# Patient Record
Sex: Female | Born: 1975
Health system: Southern US, Community
[De-identification: ages and names within clinical notes are randomized; demographics above are authoritative.]

## PROBLEM LIST (undated history)

## (undated) DIAGNOSIS — R0689 Other abnormalities of breathing: Secondary | ICD-10-CM

## (undated) DIAGNOSIS — G4730 Sleep apnea, unspecified: Secondary | ICD-10-CM

## (undated) DIAGNOSIS — R5382 Chronic fatigue, unspecified: Secondary | ICD-10-CM

## (undated) DIAGNOSIS — F418 Other specified anxiety disorders: Secondary | ICD-10-CM

## (undated) DIAGNOSIS — E7800 Pure hypercholesterolemia, unspecified: Secondary | ICD-10-CM

## (undated) DIAGNOSIS — R569 Unspecified convulsions: Secondary | ICD-10-CM

## (undated) DIAGNOSIS — I509 Heart failure, unspecified: Secondary | ICD-10-CM

## (undated) DIAGNOSIS — Z9989 Dependence on other enabling machines and devices: Secondary | ICD-10-CM

## (undated) DIAGNOSIS — E1159 Type 2 diabetes mellitus with other circulatory complications: Secondary | ICD-10-CM

## (undated) DIAGNOSIS — F329 Major depressive disorder, single episode, unspecified: Secondary | ICD-10-CM

## (undated) DIAGNOSIS — R609 Edema, unspecified: Secondary | ICD-10-CM

## (undated) DIAGNOSIS — M1990 Unspecified osteoarthritis, unspecified site: Secondary | ICD-10-CM

## (undated) DIAGNOSIS — D500 Iron deficiency anemia secondary to blood loss (chronic): Secondary | ICD-10-CM

## (undated) DIAGNOSIS — D649 Anemia, unspecified: Secondary | ICD-10-CM

## (undated) DIAGNOSIS — I10 Essential (primary) hypertension: Secondary | ICD-10-CM

## (undated) DIAGNOSIS — I871 Compression of vein: Secondary | ICD-10-CM

## (undated) DIAGNOSIS — F32A Depression, unspecified: Secondary | ICD-10-CM

## (undated) DIAGNOSIS — R000 Tachycardia, unspecified: Secondary | ICD-10-CM

## (undated) DIAGNOSIS — J45909 Unspecified asthma, uncomplicated: Secondary | ICD-10-CM

## (undated) DIAGNOSIS — E876 Hypokalemia: Secondary | ICD-10-CM

## (undated) DIAGNOSIS — R5381 Other malaise: Secondary | ICD-10-CM

## (undated) DIAGNOSIS — G4733 Obstructive sleep apnea (adult) (pediatric): Secondary | ICD-10-CM

## (undated) DIAGNOSIS — N920 Excessive and frequent menstruation with regular cycle: Secondary | ICD-10-CM

## (undated) DIAGNOSIS — D219 Benign neoplasm of connective and other soft tissue, unspecified: Secondary | ICD-10-CM

## (undated) DIAGNOSIS — F445 Conversion disorder with seizures or convulsions: Secondary | ICD-10-CM

## (undated) DIAGNOSIS — E559 Vitamin D deficiency, unspecified: Secondary | ICD-10-CM

## (undated) DIAGNOSIS — E119 Type 2 diabetes mellitus without complications: Secondary | ICD-10-CM

## (undated) DIAGNOSIS — R011 Cardiac murmur, unspecified: Secondary | ICD-10-CM

## (undated) HISTORY — PX: PORTACATH PLACEMENT: SHX2246

## (undated) HISTORY — DX: Type 2 diabetes mellitus without complications: E11.9

## (undated) HISTORY — DX: Excessive and frequent menstruation with regular cycle: N92.0

## (undated) HISTORY — DX: Chronic fatigue, unspecified: R53.82

## (undated) HISTORY — DX: Benign neoplasm of connective and other soft tissue, unspecified: D21.9

## (undated) HISTORY — PX: LEG SURGERY: SHX1003

## (undated) HISTORY — DX: Obstructive sleep apnea (adult) (pediatric): G47.33

## (undated) HISTORY — DX: Other malaise: R53.81

## (undated) HISTORY — DX: Unspecified asthma, uncomplicated: J45.909

## (undated) HISTORY — DX: Depression, unspecified: F32.A

## (undated) HISTORY — DX: Iron deficiency anemia secondary to blood loss (chronic): D50.0

## (undated) HISTORY — DX: Other specified anxiety disorders: F41.8

## (undated) HISTORY — DX: Dependence on other enabling machines and devices: Z99.89

## (undated) HISTORY — DX: Edema, unspecified: R60.9

## (undated) HISTORY — DX: Pure hypercholesterolemia, unspecified: E78.00

## (undated) HISTORY — DX: Hypomagnesemia: E83.42

## (undated) HISTORY — DX: Anemia, unspecified: D64.9

## (undated) HISTORY — DX: Type 2 diabetes mellitus with other circulatory complications: E11.59

## (undated) HISTORY — DX: Vitamin D deficiency, unspecified: E55.9

## (undated) HISTORY — DX: Hypokalemia: E87.6

## (undated) HISTORY — DX: Conversion disorder with seizures or convulsions: F44.5

## (undated) HISTORY — DX: Sleep apnea, unspecified: G47.30

## (undated) HISTORY — DX: Major depressive disorder, single episode, unspecified: F32.9

## (undated) HISTORY — PX: PORT A CATH INJECTION (ARMC HX): HXRAD1731

---

## 2000-11-22 ENCOUNTER — Encounter: Payer: Self-pay | Admitting: Emergency Medicine

## 2000-11-22 ENCOUNTER — Emergency Department (HOSPITAL_COMMUNITY): Admission: EM | Admit: 2000-11-22 | Discharge: 2000-11-22 | Payer: Self-pay | Admitting: Emergency Medicine

## 2009-02-11 ENCOUNTER — Emergency Department (HOSPITAL_COMMUNITY): Admission: EM | Admit: 2009-02-11 | Discharge: 2009-02-11 | Payer: Self-pay | Admitting: Emergency Medicine

## 2010-04-20 ENCOUNTER — Emergency Department (HOSPITAL_COMMUNITY): Admission: EM | Admit: 2010-04-20 | Discharge: 2010-04-20 | Payer: Self-pay | Admitting: Emergency Medicine

## 2011-03-09 LAB — CBC
HCT: 26.4 % — ABNORMAL LOW (ref 36.0–46.0)
Hemoglobin: 7.9 g/dL — ABNORMAL LOW (ref 12.0–15.0)
MCHC: 29.8 g/dL — ABNORMAL LOW (ref 30.0–36.0)
MCV: 63.3 fL — ABNORMAL LOW (ref 78.0–100.0)
Platelets: 341 10*3/uL (ref 150–400)
RBC: 4.17 MIL/uL (ref 3.87–5.11)
RDW: 20.3 % — ABNORMAL HIGH (ref 11.5–15.5)
WBC: 8 10*3/uL (ref 4.0–10.5)

## 2011-03-09 LAB — DIFFERENTIAL
Basophils Absolute: 0 10*3/uL (ref 0.0–0.1)
Basophils Relative: 0 % (ref 0–1)
Eosinophils Absolute: 0.1 10*3/uL (ref 0.0–0.7)
Eosinophils Relative: 1 % (ref 0–5)
Lymphocytes Relative: 11 % — ABNORMAL LOW (ref 12–46)
Lymphs Abs: 0.9 10*3/uL (ref 0.7–4.0)
Monocytes Absolute: 0.4 10*3/uL (ref 0.1–1.0)
Monocytes Relative: 5 % (ref 3–12)
Neutro Abs: 6.6 10*3/uL (ref 1.7–7.7)
Neutrophils Relative %: 83 % — ABNORMAL HIGH (ref 43–77)

## 2011-03-09 LAB — URINALYSIS, ROUTINE W REFLEX MICROSCOPIC
Bilirubin Urine: NEGATIVE
Glucose, UA: NEGATIVE mg/dL
Ketones, ur: NEGATIVE mg/dL
Protein, ur: NEGATIVE mg/dL
Urobilinogen, UA: 1 mg/dL (ref 0.0–1.0)

## 2011-03-09 LAB — BASIC METABOLIC PANEL
BUN: 8 mg/dL (ref 6–23)
Chloride: 110 mEq/L (ref 96–112)
GFR calc Af Amer: >60 mL/min (ref >60)
GFR calc non Af Amer: >60 mL/min (ref >60)
Potassium: 4.3 mEq/L (ref 3.5–5.1)
Sodium: 139 mEq/L (ref 135–145)

## 2011-03-09 LAB — URINE MICROSCOPIC-ADD ON

## 2011-03-09 LAB — PREGNANCY, URINE: Preg Test, Ur: NEGATIVE

## 2011-03-09 LAB — HEMOCCULT GUIAC POC 1CARD (OFFICE): Fecal Occult Bld: POSITIVE

## 2011-04-06 LAB — URINALYSIS, ROUTINE W REFLEX MICROSCOPIC
Ketones, ur: NEGATIVE mg/dL
Nitrite: NEGATIVE
pH: 5.5 (ref 5.0–8.0)

## 2011-04-06 LAB — URINE MICROSCOPIC-ADD ON

## 2013-02-08 DIAGNOSIS — D500 Iron deficiency anemia secondary to blood loss (chronic): Secondary | ICD-10-CM | POA: Insufficient documentation

## 2013-02-08 HISTORY — DX: Iron deficiency anemia secondary to blood loss (chronic): D50.0

## 2014-06-25 DIAGNOSIS — D649 Anemia, unspecified: Secondary | ICD-10-CM | POA: Insufficient documentation

## 2014-09-10 DIAGNOSIS — I10 Essential (primary) hypertension: Secondary | ICD-10-CM | POA: Insufficient documentation

## 2014-09-10 HISTORY — DX: Essential (primary) hypertension: I10

## 2015-08-04 ENCOUNTER — Emergency Department (HOSPITAL_COMMUNITY)
Admission: EM | Admit: 2015-08-04 | Discharge: 2015-08-04 | Disposition: A | Discharge status: Home / Self Care | Payer: Medicaid Other | Attending: Emergency Medicine | Admitting: Emergency Medicine

## 2015-08-04 ENCOUNTER — Encounter (HOSPITAL_COMMUNITY): Payer: Self-pay | Admitting: *Deleted

## 2015-08-04 DIAGNOSIS — G40909 Epilepsy, unspecified, not intractable, without status epilepticus: Secondary | ICD-10-CM | POA: Insufficient documentation

## 2015-08-04 DIAGNOSIS — I10 Essential (primary) hypertension: Secondary | ICD-10-CM | POA: Insufficient documentation

## 2015-08-04 DIAGNOSIS — Z79899 Other long term (current) drug therapy: Secondary | ICD-10-CM | POA: Diagnosis not present

## 2015-08-04 DIAGNOSIS — R569 Unspecified convulsions: Secondary | ICD-10-CM | POA: Diagnosis present

## 2015-08-04 DIAGNOSIS — Z3202 Encounter for pregnancy test, result negative: Secondary | ICD-10-CM | POA: Diagnosis not present

## 2015-08-04 HISTORY — DX: Unspecified convulsions: R56.9

## 2015-08-04 HISTORY — DX: Tachycardia, unspecified: R00.0

## 2015-08-04 HISTORY — DX: Essential (primary) hypertension: I10

## 2015-08-04 LAB — CBC WITH DIFFERENTIAL/PLATELET
BASOS ABS: 0 10*3/uL (ref 0.0–0.1)
Basophils Relative: 0 % (ref 0–1)
EOS PCT: 0 % (ref 0–5)
Eosinophils Absolute: 0 10*3/uL (ref 0.0–0.7)
HEMATOCRIT: 29 % — AB (ref 36.0–46.0)
HEMOGLOBIN: 8.5 g/dL — AB (ref 12.0–15.0)
LYMPHS ABS: 0.9 10*3/uL (ref 0.7–4.0)
LYMPHS PCT: 11 % — AB (ref 12–46)
MCH: 19.6 pg — ABNORMAL LOW (ref 26.0–34.0)
MCHC: 29.3 g/dL — AB (ref 30.0–36.0)
MCV: 67 fL — ABNORMAL LOW (ref 78.0–100.0)
MONOS PCT: 6 % (ref 3–12)
Monocytes Absolute: 0.5 10*3/uL (ref 0.1–1.0)
NEUTROS ABS: 7.1 10*3/uL (ref 1.7–7.7)
Neutrophils Relative %: 83 % — ABNORMAL HIGH (ref 43–77)
Platelets: 436 10*3/uL — ABNORMAL HIGH (ref 150–400)
RBC: 4.33 MIL/uL (ref 3.87–5.11)
RDW: 17.2 % — ABNORMAL HIGH (ref 11.5–15.5)
WBC: 8.5 10*3/uL (ref 4.0–10.5)

## 2015-08-04 LAB — BASIC METABOLIC PANEL
Anion gap: 8 (ref 5–15)
BUN: 15 mg/dL (ref 6–20)
CHLORIDE: 103 mmol/L (ref 101–111)
CO2: 23 mmol/L (ref 22–32)
CREATININE: 0.86 mg/dL (ref 0.44–1.00)
Calcium: 9 mg/dL (ref 8.9–10.3)
GFR calc non Af Amer: >60 mL/min (ref >60)
Glucose, Bld: 126 mg/dL — ABNORMAL HIGH (ref 65–99)
POTASSIUM: 4.5 mmol/L (ref 3.5–5.1)
SODIUM: 134 mmol/L — AB (ref 135–145)

## 2015-08-04 LAB — I-STAT BETA HCG BLOOD, ED (MC, WL, AP ONLY)

## 2015-08-04 MED ORDER — SODIUM CHLORIDE 0.9 % IV SOLN
1000.0000 mg | Freq: Once | INTRAVENOUS | Status: AC
  Administered 2015-08-04: 1000 mg via INTRAVENOUS
  Filled 2015-08-04: qty 10

## 2015-08-04 MED ORDER — LEVETIRACETAM 500 MG PO TABS: 500.0000 mg | ORAL_TABLET | Freq: Two times a day (BID) | ORAL | Status: DC
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
## 2015-08-04 NOTE — ED Notes (Signed)
Bed: SH70 Expected date:  Expected time:  Means of arrival:  Comments: EMS- 39yo F, seizure-like activity

## 2015-08-04 NOTE — Discharge Instructions (Signed)

## 2015-08-04 NOTE — ED Notes (Signed)
Patient states she was having lunch with her mother at [place] when she had a seizure.  Patient states she has no aurora or forewarning of seizures.  Patient states she hit the back of her head against a wall when she seized.  Patient states she also threw her lunch up after the seizure.  Patient states she had some right facial and arm numbness after seizure and that has resolved.  Patient denies nausea currently, but endorses fatigue related to being postictal.  On exam, not hematoma is noted to posterior head.  Breath sounds clear/diminished (may be r/t to body habitus).  Patient's heart sounds WNL, S1/S2, no rub, murmur or gallop. PERRL 9mm.  Grips bilaterally weak, but equal.

## 2015-08-04 NOTE — ED Notes (Signed)
Per EMS - patient presents to ED following seizure witnessed by mother approximately 30 minutes PTA.  Patient stayed in a sitting position during seizure and had some trembling to right arm post seizure which mother states is normal.  Patient also c/o right arm paresthesia and one episode of vomiting after seizure, both of which are unusual postictal behaviors.  Patient's vitals on scene, 130/90, HR 100, 98% RA, RR 16, CBG 103.  Patient denies pain anywhere and states she just "feels really tired."  Patient takes [place] for seizure and denies missing doses.

## 2016-02-03 DIAGNOSIS — R5381 Other malaise: Secondary | ICD-10-CM

## 2016-02-03 DIAGNOSIS — R000 Tachycardia, unspecified: Secondary | ICD-10-CM | POA: Insufficient documentation

## 2016-02-03 DIAGNOSIS — N938 Other specified abnormal uterine and vaginal bleeding: Secondary | ICD-10-CM | POA: Insufficient documentation

## 2016-02-03 DIAGNOSIS — R5383 Other fatigue: Secondary | ICD-10-CM

## 2016-02-03 HISTORY — DX: Other specified abnormal uterine and vaginal bleeding: N93.8

## 2016-02-03 HISTORY — DX: Other malaise: R53.81

## 2016-03-31 DIAGNOSIS — G8929 Other chronic pain: Secondary | ICD-10-CM

## 2016-03-31 DIAGNOSIS — G40309 Generalized idiopathic epilepsy and epileptic syndromes, not intractable, without status epilepticus: Secondary | ICD-10-CM

## 2016-03-31 DIAGNOSIS — IMO0001 Reserved for inherently not codable concepts without codable children: Secondary | ICD-10-CM

## 2016-03-31 DIAGNOSIS — R51 Headache: Secondary | ICD-10-CM

## 2016-03-31 DIAGNOSIS — Z789 Other specified health status: Secondary | ICD-10-CM

## 2016-03-31 DIAGNOSIS — R519 Headache, unspecified: Secondary | ICD-10-CM | POA: Insufficient documentation

## 2016-03-31 HISTORY — DX: Other chronic pain: G89.29

## 2016-03-31 HISTORY — DX: Generalized idiopathic epilepsy and epileptic syndromes, not intractable, without status epilepticus: G40.309

## 2016-03-31 HISTORY — DX: Other specified health status: Z78.9

## 2016-03-31 HISTORY — DX: Reserved for inherently not codable concepts without codable children: IMO0001

## 2016-12-28 DIAGNOSIS — N939 Abnormal uterine and vaginal bleeding, unspecified: Secondary | ICD-10-CM | POA: Diagnosis not present

## 2016-12-28 DIAGNOSIS — D500 Iron deficiency anemia secondary to blood loss (chronic): Secondary | ICD-10-CM | POA: Diagnosis not present

## 2017-01-18 DIAGNOSIS — G40901 Epilepsy, unspecified, not intractable, with status epilepticus: Secondary | ICD-10-CM | POA: Insufficient documentation

## 2017-01-18 HISTORY — DX: Epilepsy, unspecified, not intractable, with status epilepticus: G40.901

## 2017-02-09 ENCOUNTER — Encounter: Payer: Self-pay | Admitting: Emergency Medicine

## 2017-02-09 ENCOUNTER — Emergency Department
Admission: EM | Admit: 2017-02-09 | Discharge: 2017-02-09 | Disposition: A | Discharge status: Home / Self Care | Payer: Medicaid Other | Attending: Emergency Medicine | Admitting: Emergency Medicine

## 2017-02-09 DIAGNOSIS — Z79899 Other long term (current) drug therapy: Secondary | ICD-10-CM | POA: Diagnosis not present

## 2017-02-09 DIAGNOSIS — G40909 Epilepsy, unspecified, not intractable, without status epilepticus: Secondary | ICD-10-CM | POA: Insufficient documentation

## 2017-02-09 DIAGNOSIS — I10 Essential (primary) hypertension: Secondary | ICD-10-CM | POA: Insufficient documentation

## 2017-02-09 DIAGNOSIS — R569 Unspecified convulsions: Secondary | ICD-10-CM

## 2017-02-09 LAB — BASIC METABOLIC PANEL
ANION GAP: 7 (ref 5–15)
BUN: 11 mg/dL (ref 6–20)
CALCIUM: 8.8 mg/dL — AB (ref 8.9–10.3)
CO2: 17 mmol/L — AB (ref 22–32)
Chloride: 113 mmol/L — ABNORMAL HIGH (ref 101–111)
Creatinine, Ser: 0.98 mg/dL (ref 0.44–1.00)
GFR calc Af Amer: >60 mL/min (ref >60)
GLUCOSE: 121 mg/dL — AB (ref 65–99)
Potassium: 4.1 mmol/L (ref 3.5–5.1)
Sodium: 137 mmol/L (ref 135–145)

## 2017-02-09 LAB — CBC
HEMATOCRIT: 35.6 % (ref 35.0–47.0)
HEMOGLOBIN: 11.6 g/dL — AB (ref 12.0–16.0)
MCH: 24.5 pg — AB (ref 26.0–34.0)
MCHC: 32.6 g/dL (ref 32.0–36.0)
MCV: 75.1 fL — ABNORMAL LOW (ref 80.0–100.0)
Platelets: 379 10*3/uL (ref 150–440)
RBC: 4.74 MIL/uL (ref 3.80–5.20)
RDW: 27.3 % — AB (ref 11.5–14.5)
WBC: 8.6 10*3/uL (ref 3.6–11.0)

## 2017-02-09 NOTE — ED Triage Notes (Signed)
Pt comes into the ED via POV c/o seizures that occurred earlier today.  Patient has h/o of seizure.  Seizure was unwitnessed while patient was sleeping.  Loss of bladder and broke her nails.  Patient presents postictal and has decreased speech from her normal according to mother.

## 2017-02-09 NOTE — Discharge Instructions (Signed)
As we discussed it is very important that you do not drive, go up on roofs, swim, or put yourself in any situation that might be dangerous for you or others if you were to have another seizure until you are cleared by a neurologist. Please seek medical attention for any high fevers, chest pain, shortness of breath, change in behavior, persistent vomiting, bloody stool or any other new or concerning symptoms. ° °

## 2017-02-09 NOTE — ED Notes (Signed)
Patient now awake, oriented, and normal speech at this time.
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
## 2017-02-11 ENCOUNTER — Emergency Department: Payer: Medicaid Other

## 2017-02-11 ENCOUNTER — Emergency Department
Admission: EM | Admit: 2017-02-11 | Discharge: 2017-02-11 | Disposition: A | Discharge status: Home / Self Care | Payer: Medicaid Other | Attending: Student in an Organized Health Care Education/Training Program | Admitting: Student in an Organized Health Care Education/Training Program

## 2017-02-11 ENCOUNTER — Telehealth: Payer: Self-pay | Admitting: Emergency Medicine

## 2017-02-11 DIAGNOSIS — Z79899 Other long term (current) drug therapy: Secondary | ICD-10-CM | POA: Insufficient documentation

## 2017-02-11 DIAGNOSIS — R609 Edema, unspecified: Secondary | ICD-10-CM

## 2017-02-11 DIAGNOSIS — G40909 Epilepsy, unspecified, not intractable, without status epilepticus: Secondary | ICD-10-CM | POA: Diagnosis not present

## 2017-02-11 DIAGNOSIS — E860 Dehydration: Secondary | ICD-10-CM | POA: Diagnosis not present

## 2017-02-11 DIAGNOSIS — M7989 Other specified soft tissue disorders: Secondary | ICD-10-CM | POA: Diagnosis present

## 2017-02-11 LAB — CBC WITH DIFFERENTIAL/PLATELET
BASOS ABS: 0 10*3/uL (ref 0–0.1)
BASOS PCT: 0 %
EOS ABS: 0.1 10*3/uL (ref 0–0.7)
EOS PCT: 1 %
HCT: 34 % — ABNORMAL LOW (ref 35.0–47.0)
Hemoglobin: 11 g/dL — ABNORMAL LOW (ref 12.0–16.0)
Lymphocytes Relative: 16 %
Lymphs Abs: 1.3 10*3/uL (ref 1.0–3.6)
MCH: 24.8 pg — ABNORMAL LOW (ref 26.0–34.0)
MCHC: 32.3 g/dL (ref 32.0–36.0)
MCV: 76.7 fL — AB (ref 80.0–100.0)
Monocytes Absolute: 0.4 10*3/uL (ref 0.2–0.9)
Monocytes Relative: 6 %
Neutro Abs: 6.3 10*3/uL (ref 1.4–6.5)
Neutrophils Relative %: 77 %
PLATELETS: 401 10*3/uL (ref 150–440)
RBC: 4.43 MIL/uL (ref 3.80–5.20)
RDW: 25.8 % — AB (ref 11.5–14.5)
WBC: 8.1 10*3/uL (ref 3.6–11.0)

## 2017-02-11 LAB — LEVETIRACETAM LEVEL: Levetiracetam Lvl: 7.1 ug/mL — ABNORMAL LOW (ref 10.0–40.0)

## 2017-02-11 LAB — HEPATIC FUNCTION PANEL
ALT: 14 U/L (ref 14–54)
AST: 21 U/L (ref 15–41)
Albumin: 3.5 g/dL (ref 3.5–5.0)
Alkaline Phosphatase: 56 U/L (ref 38–126)
TOTAL PROTEIN: 6.8 g/dL (ref 6.5–8.1)
Total Bilirubin: 0.4 mg/dL (ref 0.3–1.2)

## 2017-02-11 LAB — BASIC METABOLIC PANEL
ANION GAP: 6 (ref 5–15)
BUN: 12 mg/dL (ref 6–20)
CALCIUM: 8.6 mg/dL — AB (ref 8.9–10.3)
CO2: 17 mmol/L — ABNORMAL LOW (ref 22–32)
Chloride: 115 mmol/L — ABNORMAL HIGH (ref 101–111)
Creatinine, Ser: 0.95 mg/dL (ref 0.44–1.00)
Glucose, Bld: 131 mg/dL — ABNORMAL HIGH (ref 65–99)
Potassium: 3.7 mmol/L (ref 3.5–5.1)
SODIUM: 138 mmol/L (ref 135–145)

## 2017-02-11 LAB — LACTIC ACID, PLASMA: LACTIC ACID, VENOUS: 1.9 mmol/L (ref 0.5–1.9)

## 2017-02-11 LAB — HCG, QUANTITATIVE, PREGNANCY

## 2017-02-11 MED ORDER — PROCHLORPERAZINE EDISYLATE 5 MG/ML IJ SOLN
10.0000 mg | Freq: Once | INTRAMUSCULAR | Status: AC
  Administered 2017-02-11: 10 mg via INTRAVENOUS
  Filled 2017-02-11: qty 2

## 2017-02-11 MED ORDER — SODIUM CHLORIDE 0.9 % IV SOLN
1000.0000 mg | Freq: Once | INTRAVENOUS | Status: AC
  Administered 2017-02-11: 1000 mg via INTRAVENOUS
  Filled 2017-02-11: qty 10

## 2017-02-11 MED ORDER — DIPHENHYDRAMINE HCL 50 MG/ML IJ SOLN
25.0000 mg | Freq: Once | INTRAMUSCULAR | Status: AC
  Administered 2017-02-11: 25 mg via INTRAVENOUS
  Filled 2017-02-11: qty 1

## 2017-02-11 MED ORDER — PROMETHAZINE HCL 12.5 MG PO TABS: 12.5000 mg | ORAL_TABLET | Freq: Four times a day (QID) | ORAL | 0 refills | Status: DC | PRN

## 2017-02-11 MED ORDER — SODIUM CHLORIDE 0.9 % IV BOLUS (SEPSIS)
1000.0000 mL | Freq: Once | INTRAVENOUS | Status: AC
  Administered 2017-02-11: 1000 mL via INTRAVENOUS

## 2017-02-11 NOTE — ED Triage Notes (Signed)
Pt arrives to ER via POV c/o low Keppra levels. Pt states that she was here on Wednesday and was discharged. Pt family reports that ER called patient back today. Pt family reports pt is swollen all over. Pt last seizure was Wednesday.
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
## 2017-02-11 NOTE — ED Notes (Signed)
Pt given ginger ale for PO challenge per Dr. Quentin Cornwall request.  Pt sleepy upon this nurse entering room, pt encouraged to drink, parents asked to continue encouraging pt to drink

## 2017-02-11 NOTE — ED Notes (Signed)
When speaking with patient about beginning protocols, pt will not answer or open eyes, head down. Pt supporting her upper body in wheelchair. PT taken to room. Assisted to bed by 3 staff members. Pt able to follow commands.

## 2017-02-11 NOTE — Telephone Encounter (Signed)
Called mom and gave her levitricam level to relay to the patient's neurologist.  She says patient continues to have swelling all over and feels bad.  I told her to also call pcp to inform of the swelling.  She willcall both doctors right now.

## 2017-02-11 NOTE — ED Notes (Signed)
Pt's mother reports pt drank all of ginger ale.  Pt reports HA improved, denies nausea

## 2017-04-06 DIAGNOSIS — N920 Excessive and frequent menstruation with regular cycle: Secondary | ICD-10-CM | POA: Diagnosis not present

## 2017-04-06 DIAGNOSIS — D500 Iron deficiency anemia secondary to blood loss (chronic): Secondary | ICD-10-CM | POA: Diagnosis not present

## 2017-07-07 DIAGNOSIS — Z862 Personal history of diseases of the blood and blood-forming organs and certain disorders involving the immune mechanism: Secondary | ICD-10-CM | POA: Diagnosis not present

## 2017-07-07 DIAGNOSIS — N920 Excessive and frequent menstruation with regular cycle: Secondary | ICD-10-CM | POA: Diagnosis not present

## 2017-07-21 DIAGNOSIS — G40822 Epileptic spasms, not intractable, without status epilepticus: Secondary | ICD-10-CM

## 2017-07-21 DIAGNOSIS — H5015 Alternating exotropia: Secondary | ICD-10-CM

## 2017-07-21 HISTORY — DX: Alternating exotropia: H50.15

## 2017-07-21 HISTORY — DX: Epileptic spasms, not intractable, without status epilepticus: G40.822

## 2017-08-18 ENCOUNTER — Ambulatory Visit (INDEPENDENT_AMBULATORY_CARE_PROVIDER_SITE_OTHER): Payer: Medicaid Other | Admitting: Diagnostic Neuroimaging

## 2017-08-18 DIAGNOSIS — R569 Unspecified convulsions: Secondary | ICD-10-CM | POA: Diagnosis not present

## 2017-08-23 NOTE — Procedures (Signed)
   [facility]  EEG (ELECTROENCEPHALOGRAM) REPORT   STUDY DATE: 08/18/17 PATIENT NAME: Jane Edwards DOB: Apr 08, 1976 MRN: [id number]  ORDERING CLINICIAN: Heide Scales, NP  TECHNOLOGIST: Laretta Alstrom  TECHNIQUE: Electroencephalogram was recorded utilizing standard 10-20 system of lead placement and reformatted into average and bipolar montages.  RECORDING TIME: 28 minutes  ACTIVATION: hyperventilation and photic stimulation  CLINICAL INFORMATION: 41 year old female with seizure like activity.  FINDINGS: Posterior dominant background rhythms, which attenuate with eye opening, ranging 10-11 hertz and 15-20 microvolts. During evaluation patient began to have typical right hand shaking movements. She was not responsive to verbal command. Muscle artifact noted during event. Immediately following the event EEG brain rhythms were normal. No epileptiform correlates noted. Following event patient was crying.  No focal, lateralizing, epileptiform activity or seizures are seen. Patient recorded in the awake and drowsy state. EKG channel shows regular rhythm of 85-90 beats per minute.   IMPRESSION:  Normal EEG in the wake and drowsy states. Patient had typical right hand shaking movements during this recording without epileptiform correlates.    INTERPRETING PHYSICIAN:  Penni Bombard, MD Certified in Neurology, Neurophysiology and Neuroimaging  [facility] [address], [place], [place] [address] [phone]

## 2017-08-29 ENCOUNTER — Ambulatory Visit (INDEPENDENT_AMBULATORY_CARE_PROVIDER_SITE_OTHER): Payer: Medicaid Other | Admitting: Neurology

## 2017-08-29 ENCOUNTER — Encounter (INDEPENDENT_AMBULATORY_CARE_PROVIDER_SITE_OTHER): Payer: Self-pay

## 2017-08-29 ENCOUNTER — Encounter: Payer: Self-pay | Admitting: Neurology

## 2017-08-29 VITALS — BP 132/81 | HR 82 | Ht 65.0 in

## 2017-08-29 DIAGNOSIS — G40909 Epilepsy, unspecified, not intractable, without status epilepticus: Secondary | ICD-10-CM

## 2017-08-29 DIAGNOSIS — Z9989 Dependence on other enabling machines and devices: Secondary | ICD-10-CM

## 2017-08-29 DIAGNOSIS — Z5181 Encounter for therapeutic drug level monitoring: Secondary | ICD-10-CM

## 2017-08-29 DIAGNOSIS — G4733 Obstructive sleep apnea (adult) (pediatric): Secondary | ICD-10-CM

## 2017-08-29 HISTORY — DX: Epilepsy, unspecified, not intractable, without status epilepticus: G40.909

## 2017-08-29 HISTORY — DX: Obstructive sleep apnea (adult) (pediatric): G47.33

## 2017-08-29 MED ORDER — LEVETIRACETAM 1000 MG PO TABS: ORAL_TABLET | ORAL | Status: DC
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
## 2017-08-31 ENCOUNTER — Telehealth: Payer: Self-pay | Admitting: Neurology

## 2017-08-31 LAB — TOPIRAMATE LEVEL: Topiramate Lvl: 3.8 ug/mL (ref 2.0–25.0)

## 2017-08-31 LAB — LEVETIRACETAM LEVEL: LEVETIRACETAM: NOT DETECTED ug/mL (ref 10.0–40.0)

## 2017-08-31 MED ORDER — TOPIRAMATE ER 150 MG PO SPRINKLE CAP24: 300.0000 mg | EXTENDED_RELEASE_CAPSULE | Freq: Every day | ORAL | 5 refills | Status: DC

## 2017-08-31 MED ORDER — TOPIRAMATE 50 MG PO TABS: ORAL_TABLET | ORAL | 0 refills | Status: DC

## 2017-08-31 NOTE — Telephone Encounter (Signed)
I called patient. The Topamax level is in the low therapeutic range, the Keppra level is 0.  We will taper the Keppra going to 2000 mg daily for 2 weeks, 1000 mg daily for 2 weeks, then stop.  I will increase the Topamax using the 50 mg tablets going up by 50 mg each week until taking 150 mg twice daily.  The mother claims that the patient was taking Keppra, it is obviously either not being swallowed, or not being absorbed. There is no indication to continue the [place].

## 2017-09-11 ENCOUNTER — Encounter: Payer: Self-pay | Admitting: Neurology

## 2017-10-25 ENCOUNTER — Telehealth: Payer: Self-pay | Admitting: Neurology

## 2017-10-25 ENCOUNTER — Encounter: Payer: Self-pay | Admitting: Neurology

## 2017-10-25 ENCOUNTER — Ambulatory Visit: Payer: Medicaid Other | Admitting: Neurology

## 2017-10-25 VITALS — BP 145/97 | HR 94 | Ht 61.0 in | Wt 340.0 lb

## 2017-10-25 DIAGNOSIS — G4733 Obstructive sleep apnea (adult) (pediatric): Secondary | ICD-10-CM | POA: Diagnosis not present

## 2017-10-25 DIAGNOSIS — E669 Obesity, unspecified: Secondary | ICD-10-CM

## 2017-10-25 DIAGNOSIS — Z9989 Dependence on other enabling machines and devices: Secondary | ICD-10-CM

## 2017-10-25 DIAGNOSIS — R4189 Other symptoms and signs involving cognitive functions and awareness: Secondary | ICD-10-CM

## 2017-10-25 HISTORY — DX: Obesity, unspecified: E66.9

## 2017-10-25 HISTORY — DX: Other symptoms and signs involving cognitive functions and awareness: R41.89

## 2017-10-25 NOTE — Patient Instructions (Addendum)
We need to see your full set up, CPAP with mask, tubing and cables.- Next visit with NP to establish compliance.     CPAP and BiPAP Information CPAP and BiPAP are methods of helping a person breathe with the use of air pressure. CPAP stands for "continuous positive airway pressure." BiPAP stands for "bi-level positive airway pressure." In both methods, air is blown through your nose or mouth and into your air passages to help you breathe well. CPAP and BiPAP use different amounts of pressure to blow air. With CPAP, the amount of pressure stays the same while you breathe in and out. With BiPAP, the amount of pressure is increased when you breathe in (inhale) so that you can take larger breaths. Your health care provider will recommend whether CPAP or BiPAP would be more helpful for you. Why are CPAP and BiPAP treatments used? CPAP or BiPAP can be helpful if you have:  Sleep apnea.  Chronic obstructive pulmonary disease (COPD).  Heart failure.  Medical conditions that weaken the muscles of the chest including muscular dystrophy, or neurological diseases such as amyotrophic lateral sclerosis (ALS).  Other problems that cause breathing to be weak, abnormal, or difficult.  CPAP is most commonly used for obstructive sleep apnea (OSA) to keep the airways from collapsing when the muscles relax during sleep. How is CPAP or BiPAP administered? Both CPAP and BiPAP are provided by a small machine with a flexible plastic tube that attaches to a plastic mask. You wear the mask. Air is blown through the mask into your nose or mouth. The amount of pressure that is used to blow the air can be adjusted on the machine. Your health care provider will determine the pressure setting that should be used based on your individual needs. When should CPAP or BiPAP be used? In most cases, the mask only needs to be worn during sleep. Generally, the mask needs to be worn throughout the night and during any daytime  naps. People with certain medical conditions may also need to wear the mask at other times when they are awake. Follow instructions from your health care provider about when to use the machine. What are some tips for using the mask?  Because the mask needs to be snug, some people feel trapped or closed-in (claustrophobic) when first using the mask. If you feel this way, you may need to get used to the mask. One way to do this is by holding the mask loosely over your nose or mouth and then gradually applying the mask more snugly. You can also gradually increase the amount of time that you use the mask.  Masks are available in various types and sizes. Some fit over your mouth and nose while others fit over just your nose. If your mask does not fit well, talk with your health care provider about getting a different one.  If you are using a mask that fits over your nose and you tend to breathe through your mouth, a chin strap may be applied to help keep your mouth closed.  The CPAP and BiPAP machines have alarms that may sound if the mask comes off or develops a leak.  If you have trouble with the mask, it is very important that you talk with your health care provider about finding a way to make the mask easier to tolerate. Do not stop using the mask. Stopping the use of the mask could have a negative impact on your health. What are some tips for  using the machine?  Place your CPAP or BiPAP machine on a secure table or stand near an electrical outlet.  Know where the on/off switch is located on the machine.  Follow instructions from your health care provider about how to set the pressure on your machine and when you should use it.  Do not eat or drink while the CPAP or BiPAP machine is on. Food or fluids could get pushed into your lungs by the pressure of the CPAP or BiPAP.  Do not smoke. Tobacco smoke residue can damage the machine.  For home use, CPAP and BiPAP machines can be rented or  purchased through home health care companies. Many different brands of machines are available. Renting a machine before purchasing may help you find out which particular machine works well for you.  Keep the CPAP or BiPAP machine and attachments clean. Ask your health care provider for specific instructions. Get help right away if:  You have redness or open areas around your nose or mouth where the mask fits.  You have trouble using the CPAP or BiPAP machine.  You cannot tolerate wearing the CPAP or BiPAP mask.  You have pain, discomfort, and bloating in your abdomen. Summary  CPAP and BiPAP are methods of helping a person breathe with the use of air pressure.  Both CPAP and BiPAP are provided by a small machine with a flexible plastic tube that attaches to a plastic mask.  If you have trouble with the mask, it is very important that you talk with your health care provider about finding a way to make the mask easier to tolerate. This information is not intended to replace advice given to you by your health care provider. Make sure you discuss any questions you have with your health care provider. Document Released: 09/03/2004 Document Revised: 10/25/2016 Document Reviewed: 10/25/2016 Elsevier Interactive Patient Education  2017 [employer].

## 2017-10-25 NOTE — Telephone Encounter (Signed)
I received some notes from Dr. Tonia Ghent, and in April 2017, the patient had an EEG showing occasional bilateral spike and wave discharges.  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  Medication adjustments have not improved seizure frequency again suggesting many of these events are pseudoseizures.
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
## 2017-10-27 ENCOUNTER — Encounter: Payer: Self-pay | Admitting: Neurology

## 2017-10-28 ENCOUNTER — Encounter: Payer: Self-pay | Admitting: Neurology

## 2017-11-03 ENCOUNTER — Other Ambulatory Visit: Payer: Self-pay | Admitting: Neurology

## 2017-11-03 DIAGNOSIS — G4733 Obstructive sleep apnea (adult) (pediatric): Secondary | ICD-10-CM

## 2017-11-07 DIAGNOSIS — N920 Excessive and frequent menstruation with regular cycle: Secondary | ICD-10-CM | POA: Diagnosis not present

## 2017-11-07 DIAGNOSIS — D500 Iron deficiency anemia secondary to blood loss (chronic): Secondary | ICD-10-CM | POA: Diagnosis not present

## 2017-11-21 ENCOUNTER — Ambulatory Visit: Payer: Medicaid Other | Admitting: Neurology

## 2017-11-21 DIAGNOSIS — G4733 Obstructive sleep apnea (adult) (pediatric): Secondary | ICD-10-CM | POA: Diagnosis not present

## 2017-11-22 DIAGNOSIS — F4310 Post-traumatic stress disorder, unspecified: Secondary | ICD-10-CM

## 2017-11-22 DIAGNOSIS — Z6841 Body Mass Index (BMI) 40.0 and over, adult: Secondary | ICD-10-CM | POA: Insufficient documentation

## 2017-11-22 HISTORY — DX: Post-traumatic stress disorder, unspecified: F43.10

## 2017-11-22 HISTORY — DX: Body Mass Index (BMI) 40.0 and over, adult: Z684

## 2017-11-23 ENCOUNTER — Other Ambulatory Visit: Payer: Self-pay | Admitting: Neurology

## 2017-11-23 DIAGNOSIS — E669 Obesity, unspecified: Secondary | ICD-10-CM

## 2017-11-23 DIAGNOSIS — R4189 Other symptoms and signs involving cognitive functions and awareness: Secondary | ICD-10-CM

## 2017-11-23 DIAGNOSIS — G4719 Other hypersomnia: Secondary | ICD-10-CM

## 2017-11-23 NOTE — Procedures (Signed)
[facility] [address] [place], [place] [address] NAME: [name]                                                         DOB: [birth year] MEDICAL RECORD No:  [id number]                                DOS: 11/21/2017 REFERRING PHYSICIAN: Lenor Coffin, M.D.  STUDY PERFORMED: HST - apnea link   HISTORY: Jane Edwards is a 41 y.o. female, seen here referred from Dr. Jannifer Franklin to transfer her CPAP care from Dr. Tonia Ghent.  She was diagnosed with OSA and placed on CPAP- provided no additional information. She did not provide a copy of her sleep study and did not bring her CPAP machine as requested. Based on her and her mother's recollection she was told her apnea was not mild and associated with tachycardia, hypoxemia - and that she was needing CPAP. She resides 90 minutes by car from here.  She confessed that she became non -compliant with CPAP use after she ran out of supplies-    Addendum from 11-08- 2018, able to locate the studies from Dr. Marilu Favre of [facility]. The patient underwent a polysomnography on 01 June 2016 and was diagnosed with mild obstructive sleep apnea at an AHI of only 10.3, moderate snoring, no significant limb jerks, Symptoms : insomnia complaint, high degree of excessive daytime sleepiness was endorsed by Epworth Sleepiness Scale of 20. He recommended CPAP or BiPAP- I did not see that the patient return for CPAP titration study and I presume that she was placed on an auto CPAP.  The auto CPAP residual AHI is 0.2.  Epworth score today: 24/ 24, Fatigue severity score 58 points, BMI 64.24!    STUDY RESULTS:  Total Recording Time:  11 hours 23 minutes Total Apnea/Hypopnea Index (AHI):  0.6/Hour Average Oxygen Saturation: 96 %; Lowest Oxygen Saturation:  92%; no Oxygen Saturation Below 89% Average Heart Rate:  84 bpm (70-107)  IMPRESSION: This HST shows no need for the patient to use CPAP. Apnea is negligible, there was no hypoxemia.   I am surprised that these results are so benign given that this patient is at high risk for obesity hypoventilation. Her hypersomnia may be related to psychiatric underlying disorder/ depression. RECOMMENDATION: I will place a HLA narcolepsy test in Epic, no follow up in sleep clinic needed if negative. Dr. Jannifer Franklin will be informed of the results.  I certify that I have reviewed the raw data recording prior to the issuance of this report in accordance with the standards of the American Academy of Sleep Medicine (AASM). Larey Seat, M.D.      11-23-2017   Medical Director of [facility] at [facility], [place], [place] and [facility] and accredited by AASM

## 2017-11-24 ENCOUNTER — Telehealth: Payer: Self-pay

## 2017-11-24 NOTE — Telephone Encounter (Signed)
Called patient to go over HST results and schedule a repeat. No answer and no voicemail. Will try again later

## 2017-12-29 ENCOUNTER — Ambulatory Visit: Payer: Medicaid Other | Admitting: Neurology

## 2018-01-20 DIAGNOSIS — D500 Iron deficiency anemia secondary to blood loss (chronic): Secondary | ICD-10-CM | POA: Diagnosis not present

## 2018-01-20 DIAGNOSIS — N920 Excessive and frequent menstruation with regular cycle: Secondary | ICD-10-CM | POA: Diagnosis not present

## 2018-02-06 ENCOUNTER — Telehealth: Payer: Self-pay | Admitting: Neurology

## 2018-02-06 NOTE — Telephone Encounter (Signed)
[facility] is reaching out to let us know that since the patient never completed Sleep study she would need to repeat the process again.  "Hey we were unable to obtain realty and patient was going to have a release Ss. We were waiting on the date of the repeat SS so we could follow. Patient is aware of what needs to happen just need to restart with Dr Brett Fairy."  Patient will contact us when she is ready to set this up

## 2018-02-07 ENCOUNTER — Encounter: Payer: Self-pay | Admitting: Neurology

## 2018-02-07 ENCOUNTER — Ambulatory Visit: Payer: Medicaid Other | Admitting: Neurology

## 2018-02-07 VITALS — BP 135/88 | HR 91 | Ht 61.0 in | Wt 327.0 lb

## 2018-02-07 DIAGNOSIS — R569 Unspecified convulsions: Secondary | ICD-10-CM | POA: Diagnosis not present

## 2018-02-07 DIAGNOSIS — G4719 Other hypersomnia: Secondary | ICD-10-CM

## 2018-02-07 DIAGNOSIS — Z9989 Dependence on other enabling machines and devices: Secondary | ICD-10-CM | POA: Diagnosis not present

## 2018-02-07 DIAGNOSIS — F5109 Other insomnia not due to a substance or known physiological condition: Secondary | ICD-10-CM | POA: Diagnosis not present

## 2018-02-07 DIAGNOSIS — Z6841 Body Mass Index (BMI) 40.0 and over, adult: Secondary | ICD-10-CM

## 2018-02-07 DIAGNOSIS — E662 Morbid (severe) obesity with alveolar hypoventilation: Secondary | ICD-10-CM

## 2018-02-07 DIAGNOSIS — G4733 Obstructive sleep apnea (adult) (pediatric): Secondary | ICD-10-CM | POA: Diagnosis not present

## 2018-02-07 DIAGNOSIS — F519 Sleep disorder not due to a substance or known physiological condition, unspecified: Secondary | ICD-10-CM
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
## 2018-02-16 ENCOUNTER — Other Ambulatory Visit: Payer: Self-pay | Admitting: Neurology

## 2018-02-16 ENCOUNTER — Encounter: Payer: Self-pay | Admitting: Neurology

## 2018-02-16 NOTE — Telephone Encounter (Addendum)
Tried calling patient's mother to clarify how she is taking topiramate 50mg  tablet. Went to VM, VM full. Will try again later.  Also need to clarify which pharmacy they use. We got two refill requests for same medication from two different pharmacies.

## 2018-02-17 ENCOUNTER — Encounter: Payer: Self-pay | Admitting: Neurology

## 2018-02-17 ENCOUNTER — Telehealth: Payer: Self-pay | Admitting: Neurology

## 2018-02-17 ENCOUNTER — Other Ambulatory Visit: Payer: Self-pay | Admitting: Neurology

## 2018-02-17 MED ORDER — TOPIRAMATE 50 MG PO TABS: 150.0000 mg | ORAL_TABLET | Freq: Every day | ORAL | 5 refills | Status: DC

## 2018-02-17 MED ORDER — TOPIRAMATE ER 150 MG PO SPRINKLE CAP24: 300.0000 mg | EXTENDED_RELEASE_CAPSULE | Freq: Every day | ORAL | 2 refills | Status: DC

## 2018-02-17 NOTE — Telephone Encounter (Signed)
The prescription for Topamax was sent in, she will take 3 of the 50 mg tablets daily.

## 2018-02-17 NOTE — Telephone Encounter (Signed)
The information regarding topiramate was incorrect in the computer.  Apparently the patient is on extended release topiramate 150 mg tablet taking 2 tablets daily.  She gets a total of 300 mg a day, not 150 mg a day.  A prescription for the 150 extended release tablets was sent in taking 2 daily.

## 2018-03-13 ENCOUNTER — Ambulatory Visit (INDEPENDENT_AMBULATORY_CARE_PROVIDER_SITE_OTHER): Payer: Medicaid Other | Admitting: Neurology

## 2018-03-13 DIAGNOSIS — G4733 Obstructive sleep apnea (adult) (pediatric): Secondary | ICD-10-CM

## 2018-03-13 DIAGNOSIS — E669 Obesity, unspecified: Secondary | ICD-10-CM

## 2018-03-13 DIAGNOSIS — E662 Morbid (severe) obesity with alveolar hypoventilation: Secondary | ICD-10-CM

## 2018-03-13 DIAGNOSIS — F519 Sleep disorder not due to a substance or known physiological condition, unspecified: Secondary | ICD-10-CM

## 2018-03-13 DIAGNOSIS — G4719 Other hypersomnia: Secondary | ICD-10-CM

## 2018-03-13 DIAGNOSIS — R569 Unspecified convulsions: Secondary | ICD-10-CM

## 2018-03-13 DIAGNOSIS — R0689 Other abnormalities of breathing: Secondary | ICD-10-CM

## 2018-03-13 DIAGNOSIS — Z6841 Body Mass Index (BMI) 40.0 and over, adult: Secondary | ICD-10-CM

## 2018-03-13 DIAGNOSIS — F5109 Other insomnia not due to a substance or known physiological condition: Secondary | ICD-10-CM

## 2018-03-13 DIAGNOSIS — Z9989 Dependence on other enabling machines and devices: Secondary | ICD-10-CM

## 2018-03-17 ENCOUNTER — Encounter: Payer: Self-pay | Admitting: Neurology

## 2018-03-17 DIAGNOSIS — R569 Unspecified convulsions: Secondary | ICD-10-CM | POA: Insufficient documentation

## 2018-03-17 DIAGNOSIS — E662 Morbid (severe) obesity with alveolar hypoventilation: Secondary | ICD-10-CM | POA: Insufficient documentation

## 2018-03-17 DIAGNOSIS — G4719 Other hypersomnia: Secondary | ICD-10-CM

## 2018-03-17 DIAGNOSIS — Z6841 Body Mass Index (BMI) 40.0 and over, adult: Secondary | ICD-10-CM

## 2018-03-17 DIAGNOSIS — F519 Sleep disorder not due to a substance or known physiological condition, unspecified: Secondary | ICD-10-CM | POA: Insufficient documentation

## 2018-03-17 DIAGNOSIS — E66813 Obesity, class 3: Secondary | ICD-10-CM

## 2018-03-17 DIAGNOSIS — F5109 Other insomnia not due to a substance or known physiological condition: Secondary | ICD-10-CM | POA: Insufficient documentation

## 2018-03-17 HISTORY — DX: Obesity, class 3: E66.813

## 2018-03-17 HISTORY — DX: Sleep disorder not due to a substance or known physiological condition, unspecified: F51.9

## 2018-03-17 HISTORY — DX: Morbid (severe) obesity with alveolar hypoventilation: E66.2

## 2018-03-17 HISTORY — DX: Unspecified convulsions: R56.9

## 2018-03-17 HISTORY — DX: Body Mass Index (BMI) 40.0 and over, adult: Z684

## 2018-03-17 HISTORY — DX: Other hypersomnia: G47.19

## 2018-03-17 NOTE — Addendum Note (Signed)
Addended by: Larey Seat on: 03/17/2018 03:42 PM   Modules accepted: Orders

## 2018-03-17 NOTE — Procedures (Signed)
PATIENT'S NAME:  Jane Edwards, Jane Edwards DOB:      1976-09-18      MR#:    [id number]     DATE OF RECORDING: 03/13/2018 REFERRING M.D.:  Floyde Parkins, MD and Andrey Spearman, MD  Study Performed:   Baseline Polysomnogram with seizure EEG montage  HISTORY:  CPAP care transfer patient vie Dr Jannifer Franklin from Dr. Tonia Ghent. This patient had a previous sleep study in 08-2016 was diagnosed with OSA at an AHI of 10.2 and prescribed CPAP by Dr. Tonia Ghent. She was issued an auto-titration machine in 2017 and now cannot get supplies because of non - compliance. Her mother stated there were difficulties with communication.  Her next sleep test here at [facility] was a home sleep test to resume CPAP supplies. Total recording time HST was 11 hours 23 minutes and to my great surprise apnea was negligible.  We repeat this attended sleep study to confirm the HST findings*, if no apnea is found again we may need to evaluate her for narcolepsy.  *Her AHI had been 0.6 apneas per hour and no oxygen desaturation was noted. No set sleep routine or set times to go to bed, times to rise from bed, she has not participated in regular activities, she did not memorize well.   The patient endorsed the Epworth Sleepiness Scale at 24/24 points.  The patient's weight 327 pounds with a height of 61 (inches), resulting in a BMI of 61.6 kg/m2.The patient's neck circumference measured 18.5 inches.  CURRENT MEDICATIONS: Amlodipine, Docusate sodium, Ferrous Sulfate, Hydrochlorothiazide, Hydroxyzine, Medroxyprogesterone, Metoprolol, Prazosin, Promethazine and Topiramate.   PROCEDURE:  This is a multichannel digital polysomnogram utilizing the SomnoStar 11.2 system.  Electrodes and sensors were applied and monitored per AASM Specifications.   EEG, EOG, Chin and Limb EMG, were sampled at 200 Hz.  ECG, Snore and Nasal Pressure, Thermal Airflow, Respiratory Effort, CPAP Flow and Pressure, Oximetry was sampled at 50 Hz. Digital video and audio were recorded.      BASELINE  STUDY: Lights Out was at 21:29 and Lights On at 05:12.  Total recording time (TRT) was 463.5 minutes, with a total sleep time (TST) of 349.5 minutes.   The patient's sleep latency was 47 minutes.  REM latency was 99 minutes.  The sleep efficiency was 75.4 %.     SLEEP ARCHITECTURE: WASO (Wake after sleep onset) was 68 minutes.  There were 18.5 minutes in Stage N1, 272.5 minutes Stage N2, 0 minutes Stage N3 and 58.5 minutes in Stage REM.  The percentage of Stage N1 was 5.3%, Stage N2 was 78.%, Stage N3 was 0% and Stage R (REM sleep) was 16.7%.   RESPIRATORY ANALYSIS:  There were a total of 42 respiratory events:  3 obstructive apneas, 0 central apneas and 0 mixed apneas with a total of 3 apneas and an apnea index (AI) of 0.5 /hour. There were 39 hypopneas with a hypopnea index of 6.7 /hour. The patient also had 0 respiratory event related arousals (RERAs).The total APNEA/HYPOPNEA INDEX (AHI) was 7.2/hour and the total RESPIRATORY DISTURBANCE INDEX was 7.2 /hour.  33 events occurred in REM sleep and 18 events in NREM. The REM AHI was 33.8 /hour, versus a non-REM AHI of 1.9. The patient spent her total sleep time in the seated- reclined position (350 minutes). The non-supine AHI was 7.2.  OXYGEN SATURATION & C02:  The Wake baseline 02 saturation was 96%, with the lowest being 69%. Time spent below 89% saturation equaled 3 minutes. End Tidal CO2 during REM sleep  was 53 torr.  Tidal CO2 during NREM sleep was 50 torr. CO2 retention was present.    PERIODIC LIMB MOVEMENTS:  The patient had a total of 0 Periodic Limb Movements.  The arousals were noted as: 24 were spontaneous, 0 were associated with PLMs, and 42 were associated with respiratory events. The patient slept in a chair. Audio and video analysis did not show any abnormal or unusual movements, behaviors, phonations or vocalizations.  Moderate snoring, no nocturia.  EKG was in keeping with normal sinus rhythm (NSR).   Post-study, the patient indicated  that sleep was the same as usual.    IMPRESSION:  1. Very mild Obstructive Sleep Apnea (OSA) without prolonged hypoxemia, clinically insignificant degree of apnea. REM accentuated apnea. 2. Hypercapnia was noted without hypoxemia. Obesity hypoventilation is suspected. Mild Primary Snoring. 3. Slow sleep EEG.  RECOMMENDATIONS:  1. Given the patient's EDS and high CO2, I would support continuing CPAP therapy.  2. She may have CO2 narcosis. I will need to send her machine to DME or have her local DME visit her at home. The machine is reported to be auto-titration capable. 3. Give auto titration CPAP the following settings: 5-12 cm water, no EPR, heated humidity and mask of her choice.  4.  Advise to lose weight (BMI 61.6). 5. Consider dedicated weight management referral through primary physician, and psychology referral for psychomotor slowing.  6. A follow up appointment will be scheduled in the Sleep Clinic at [facility]. The referring provider will be notified of the results.     I certify that I have reviewed the entire raw data recording prior to the issuance of this report in accordance with the Standards of Accreditation of the American Academy of Sleep Medicine (AASM).   Larey Seat, MD   03-17-2018  Diplomat, American Board of Psychiatry and Neurology  Diplomat, [place] of Sleep Medicine Medical Director [facility]

## 2018-03-20 ENCOUNTER — Telehealth: Payer: Self-pay | Admitting: Neurology

## 2018-03-20 NOTE — Telephone Encounter (Signed)
I called pt, her mom answered.  I advised pt's mom that Dr. Brett Fairy reviewed their sleep study results and found that pt has mild sleep apnea . Dr. Brett Fairy recommends that pt starts a auto CPAP. I reviewed PAP compliance expectations with the pt. Pt is agreeable to starting a CPAP. I advised pt that an order will be sent to a DME, [facility], and [facility] will call the pt within about one week after they file with the pt's insurance. [facility] will show the pt how to use the machine, fit for masks, and troubleshoot the CPAP if needed. A follow up appt was made for insurance purposes with Cecille Rubin, NP on May 23, 2018 at 9:45 am. Pt verbalized understanding to arrive 15 minutes early and bring their CPAP. A letter with all of this information in it will be mailed to the pt as a reminder. I verified with the pt that the address we have on file is correct. Pt verbalized understanding of results. Pt had no questions at this time but was encouraged to call back if questions arise.

## 2018-03-20 NOTE — Telephone Encounter (Signed)
-----   Message from Larey Seat, MD sent at 03/17/2018  3:42 PM EDT -----  Hypercapnia, superobesity, hypoventilation. OSA was very mild at 7.2 /h  but needs to be treated to eliminate it as a cause of hypersomnia.  Have local DME restart her on auto CPAP ( if that is what she was using- we have not seen the machine). Given the patient's EDS and high CO2, I would support continuing CPAP therapy.  2. She may have CO2 narcosis. I will need to send her with her machine to  DME or have her local DME visit her at home. The machine is reported to be auto-titration capable. 3. CPAP auto settings: 5-12cm  water, no EPR, heated humidity and mask of her choice.    PCP is Heide Scales, NP primary neurologist is Dr Jannifer Franklin.

## 2018-05-08 ENCOUNTER — Encounter (HOSPITAL_COMMUNITY): Payer: Self-pay | Admitting: Psychiatry

## 2018-05-08 ENCOUNTER — Ambulatory Visit (INDEPENDENT_AMBULATORY_CARE_PROVIDER_SITE_OTHER): Payer: Medicaid Other | Admitting: Psychiatry

## 2018-05-08 VITALS — BP 124/105 | HR 71 | Ht 61.5 in | Wt 310.0 lb

## 2018-05-08 DIAGNOSIS — R51 Headache: Secondary | ICD-10-CM

## 2018-05-08 DIAGNOSIS — Z62810 Personal history of physical and sexual abuse in childhood: Secondary | ICD-10-CM

## 2018-05-08 DIAGNOSIS — F4310 Post-traumatic stress disorder, unspecified: Secondary | ICD-10-CM | POA: Diagnosis not present

## 2018-05-08 DIAGNOSIS — F411 Generalized anxiety disorder: Secondary | ICD-10-CM | POA: Diagnosis not present

## 2018-05-08 DIAGNOSIS — R450 Nervousness: Secondary | ICD-10-CM

## 2018-05-08 DIAGNOSIS — F4010 Social phobia, unspecified: Secondary | ICD-10-CM | POA: Diagnosis not present

## 2018-05-08 MED ORDER — PRAZOSIN HCL 1 MG PO CAPS: 1.0000 mg | ORAL_CAPSULE | Freq: Every day | ORAL | 1 refills | Status: DC
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
## 2018-05-21 ENCOUNTER — Encounter: Payer: Self-pay | Admitting: Nurse Practitioner

## 2018-05-22 ENCOUNTER — Ambulatory Visit: Payer: Medicaid Other | Admitting: Nurse Practitioner

## 2018-05-22 ENCOUNTER — Encounter: Payer: Self-pay | Admitting: Nurse Practitioner

## 2018-05-22 VITALS — BP 140/84 | HR 84 | Ht 61.5 in | Wt 333.6 lb

## 2018-05-22 DIAGNOSIS — G4733 Obstructive sleep apnea (adult) (pediatric): Secondary | ICD-10-CM

## 2018-05-22 DIAGNOSIS — Z9989 Dependence on other enabling machines and devices: Secondary | ICD-10-CM | POA: Diagnosis not present

## 2018-05-22 NOTE — Patient Instructions (Signed)
CPAP compliance 57% Continue same settings Follow-up in 4 months
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
## 2018-05-23 ENCOUNTER — Ambulatory Visit: Payer: Self-pay | Admitting: Nurse Practitioner

## 2018-05-25 NOTE — Progress Notes (Signed)
I agree with the assessment and plan as directed by NP .The patient is known to me .   Oaklynn Stierwalt, MD  

## 2018-05-26 DIAGNOSIS — N920 Excessive and frequent menstruation with regular cycle: Secondary | ICD-10-CM

## 2018-05-26 DIAGNOSIS — D508 Other iron deficiency anemias: Secondary | ICD-10-CM | POA: Diagnosis not present

## 2018-05-30 ENCOUNTER — Other Ambulatory Visit (HOSPITAL_COMMUNITY): Payer: Self-pay | Admitting: Psychiatry

## 2018-05-30 DIAGNOSIS — F4310 Post-traumatic stress disorder, unspecified: Secondary | ICD-10-CM

## 2018-06-02 ENCOUNTER — Ambulatory Visit: Payer: Medicaid Other | Admitting: Neurology

## 2018-06-14 ENCOUNTER — Encounter: Payer: Self-pay | Admitting: Nurse Practitioner

## 2018-06-20 ENCOUNTER — Ambulatory Visit (INDEPENDENT_AMBULATORY_CARE_PROVIDER_SITE_OTHER): Payer: Medicaid Other | Admitting: Psychiatry

## 2018-06-20 ENCOUNTER — Encounter (HOSPITAL_COMMUNITY): Payer: Self-pay | Admitting: Psychiatry

## 2018-06-20 VITALS — BP 124/85 | HR 75 | Ht 61.5 in | Wt 320.0 lb

## 2018-06-20 DIAGNOSIS — F4310 Post-traumatic stress disorder, unspecified: Secondary | ICD-10-CM | POA: Diagnosis not present

## 2018-06-20 DIAGNOSIS — F330 Major depressive disorder, recurrent, mild: Secondary | ICD-10-CM | POA: Diagnosis not present

## 2018-06-20 MED ORDER — PAROXETINE HCL 10 MG PO TABS: 10.0000 mg | ORAL_TABLET | Freq: Every day | ORAL | 1 refills | Status: DC

## 2018-06-20 MED ORDER — PRAZOSIN HCL 1 MG PO CAPS: 1.0000 mg | ORAL_CAPSULE | Freq: Every day | ORAL | 1 refills | Status: DC
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
## 2018-07-12 ENCOUNTER — Other Ambulatory Visit (HOSPITAL_COMMUNITY): Payer: Self-pay | Admitting: Psychiatry

## 2018-07-12 DIAGNOSIS — F4310 Post-traumatic stress disorder, unspecified: Secondary | ICD-10-CM

## 2018-07-12 DIAGNOSIS — F330 Major depressive disorder, recurrent, mild: Secondary | ICD-10-CM

## 2018-07-27 ENCOUNTER — Other Ambulatory Visit (HOSPITAL_COMMUNITY): Payer: Self-pay | Admitting: Psychiatry

## 2018-07-27 DIAGNOSIS — F4310 Post-traumatic stress disorder, unspecified: Secondary | ICD-10-CM

## 2018-08-13 ENCOUNTER — Other Ambulatory Visit (HOSPITAL_COMMUNITY): Payer: Self-pay | Admitting: Psychiatry

## 2018-08-13 DIAGNOSIS — F330 Major depressive disorder, recurrent, mild: Secondary | ICD-10-CM

## 2018-08-13 DIAGNOSIS — F4310 Post-traumatic stress disorder, unspecified: Secondary | ICD-10-CM

## 2018-08-25 ENCOUNTER — Other Ambulatory Visit (HOSPITAL_COMMUNITY): Payer: Self-pay

## 2018-08-25 DIAGNOSIS — F4310 Post-traumatic stress disorder, unspecified: Secondary | ICD-10-CM

## 2018-08-25 DIAGNOSIS — D509 Iron deficiency anemia, unspecified: Secondary | ICD-10-CM | POA: Diagnosis not present

## 2018-08-25 DIAGNOSIS — F330 Major depressive disorder, recurrent, mild: Secondary | ICD-10-CM

## 2018-08-25 MED ORDER — PAROXETINE HCL 10 MG PO TABS: 10.0000 mg | ORAL_TABLET | Freq: Every day | ORAL | 1 refills | Status: DC

## 2018-08-25 MED ORDER — PRAZOSIN HCL 1 MG PO CAPS: 1.0000 mg | ORAL_CAPSULE | Freq: Every day | ORAL | 1 refills | Status: DC

## 2018-08-28 ENCOUNTER — Ambulatory Visit (HOSPITAL_COMMUNITY): Payer: Self-pay | Admitting: Psychiatry

## 2018-09-04 ENCOUNTER — Encounter (HOSPITAL_COMMUNITY): Payer: Self-pay | Admitting: Licensed Clinical Social Worker

## 2018-09-04 ENCOUNTER — Ambulatory Visit (INDEPENDENT_AMBULATORY_CARE_PROVIDER_SITE_OTHER): Payer: Medicaid Other | Admitting: Licensed Clinical Social Worker

## 2018-09-04 DIAGNOSIS — F4310 Post-traumatic stress disorder, unspecified: Secondary | ICD-10-CM
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
## 2018-09-25 ENCOUNTER — Ambulatory Visit: Payer: Medicaid Other | Admitting: Nurse Practitioner

## 2018-09-26 ENCOUNTER — Encounter (HOSPITAL_COMMUNITY): Payer: Self-pay | Admitting: Licensed Clinical Social Worker

## 2018-09-26 ENCOUNTER — Ambulatory Visit (INDEPENDENT_AMBULATORY_CARE_PROVIDER_SITE_OTHER): Payer: Medicaid Other | Admitting: Licensed Clinical Social Worker

## 2018-09-26 DIAGNOSIS — F4310 Post-traumatic stress disorder, unspecified: Secondary | ICD-10-CM

## 2018-09-26 NOTE — Progress Notes (Addendum)
   THERAPIST PROGRESS NOTE  Session Time: 11:10-12pm  Participation Level: Active  Behavioral Response: CasualAlertAnxious  Type of Therapy: Individual Therapy  Treatment Goals addressed: Improve psychiatric symptoms, Controlled Behavior, Moderated Mood, Improve Unhelpful Thought Patterns, Emotional Regulation Skills (Moderate moods, anger management, stress management), Feel and express a full Range of Emotions, Learn about Diagnosis, Healthy Coping Skills, Recall the Traumatic event without being overwhelmed   Interventions: CBT/Motivational Interviewing  Summary: Jane Edwards is a 42 y.o. female who presents for her initial individual counseling session. Spent a considerable amount of time building a trusting therapeutic relationship and gaining background information. Pt lives with her younger brother and her parents. Pt moved to [place] from [place] because she Publishing rights manager her medical issues. (seizures (1-2 x mo), chronic anemia, headaches, iron deficiency, tachycardia). Pt and her parents moved in with her brother in [place].  Pt is working on her disability claim. She has been denied previously. Mother has POA (medical) for pt. Pt reports she cried herself to sleep last night. She sees Dr. Adele Schilder for medication management. Pt has genetic testing done by her PCP. I suggested she give the results to Dr. Adele Schilder at her next appointment. Pt is in wheel chair due to her fatigue and dizziness. At home she uses a walker. Pt has been having crying spells almost daily. Asked open ended questions. Pt feels she doesn't have anything to accomplishment daily. Taught pt how to make  incremental goals daily.   Suicidal/Homicidal: Nowithout intent/plan  Therapist Response: Assessed pt's current functioning and reviewed progress. Assisted pt building a trusting, therapeutic relationship. Assisted pt processing for the management of her stressors.)  Plan: Return again in 2-3 weeks. Journaling  crying spells  Diagnosis: Axis I: PTSD    [name],[name], LCAS 09/26/2018

## 2018-10-11 ENCOUNTER — Encounter (HOSPITAL_COMMUNITY): Payer: Self-pay | Admitting: Licensed Clinical Social Worker

## 2018-10-11 ENCOUNTER — Ambulatory Visit (INDEPENDENT_AMBULATORY_CARE_PROVIDER_SITE_OTHER): Payer: Medicaid Other | Admitting: Licensed Clinical Social Worker

## 2018-10-11 DIAGNOSIS — F4310 Post-traumatic stress disorder, unspecified: Secondary | ICD-10-CM

## 2018-10-11 NOTE — Progress Notes (Addendum)
   THERAPIST PROGRESS NOTE  Session Time: 11:10-12pm  Participation Level: Active  Behavioral Response: CasualAlertAnxious  Type of Therapy: Individual Therapy  Treatment Goals addressed: Improve psychiatric symptoms, Controlled Behavior, Moderated Mood, Improve Unhelpful Thought Patterns, Emotional Regulation Skills (Moderate moods, anger management, stress management), Feel and express a full Range of Emotions, Learn about Diagnosis, Healthy Coping Skills, Recall the Traumatic event without being overwhelmed.  Interventions: CBT  Summary: Jane Edwards is a 42 y.o. female who presents for her initial individual counseling session. Pt discussed her psychiatric symptoms and current life events. Pt reports she does not believe her medication (Paxil) is working. She had genetic testing done by her PCP in [place], not a [facility] doc. Which shows Paxil will not work for her. Suggested pt bring a copy of the genetic testing with her to her appt with Dr. Adele Schilder 11/4. Pt has been journaling. Read from her journal. Pt has been crying almost daily due to her medical issues, which she feels are getting worse. Asked open ended questions and used empathic reflection. Pt shared she feels sad a lot of the time. "I feel useless." Processed her feelings. She had a seizure 2 weeks ago. She can tell when a seizure may happen when she gets too tired or stressed. Educated pt on sleep hygiene. She doesn't sleep more than a couple of hours at at time.  Pt shared she has been trying to make accomplishments daily. Taught pt how to make  incremental goals daily. Her daily goal has been taking a few steps around the house, using her walker (out of her wheel chair).  Suicidal/Homicidal: Nowithout intent/plan  Therapist Response: Assessed pt's current functioning and reviewed progress. Assisted pt processing feelings, accomplishments, medication, journaling. Assisted pt processing for the management of her stressors.)  Plan:  Return again in 2-3 weeks. Journaling crying spells. Disability hearing 1/8  Diagnosis: Axis I: PTSD    Acen Craun S, LCAS 10/11/2018

## 2018-10-17 ENCOUNTER — Other Ambulatory Visit (HOSPITAL_COMMUNITY): Payer: Self-pay | Admitting: Psychiatry

## 2018-10-17 DIAGNOSIS — F4310 Post-traumatic stress disorder, unspecified: Secondary | ICD-10-CM

## 2018-10-17 DIAGNOSIS — F330 Major depressive disorder, recurrent, mild: Secondary | ICD-10-CM

## 2018-10-23 ENCOUNTER — Ambulatory Visit (INDEPENDENT_AMBULATORY_CARE_PROVIDER_SITE_OTHER): Payer: Medicaid Other | Admitting: Psychiatry

## 2018-10-23 ENCOUNTER — Encounter (HOSPITAL_COMMUNITY): Payer: Self-pay | Admitting: Psychiatry

## 2018-10-23 VITALS — BP 132/80 | HR 74 | Ht 61.5 in | Wt 300.0 lb

## 2018-10-23 DIAGNOSIS — F4310 Post-traumatic stress disorder, unspecified: Secondary | ICD-10-CM | POA: Diagnosis not present

## 2018-10-23 DIAGNOSIS — F330 Major depressive disorder, recurrent, mild: Secondary | ICD-10-CM

## 2018-10-23 MED ORDER — FLUOXETINE HCL 10 MG PO CAPS: ORAL_CAPSULE | ORAL | 1 refills | Status: DC

## 2018-10-23 MED ORDER — PRAZOSIN HCL 1 MG PO CAPS: 1.0000 mg | ORAL_CAPSULE | Freq: Every day | ORAL | 1 refills | Status: DC
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
## 2018-10-25 ENCOUNTER — Encounter: Payer: Self-pay | Admitting: Nurse Practitioner
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
## 2018-10-27 ENCOUNTER — Telehealth: Payer: Self-pay | Admitting: Neurology

## 2018-10-27 ENCOUNTER — Encounter: Payer: Self-pay | Admitting: Nurse Practitioner

## 2018-10-27 ENCOUNTER — Ambulatory Visit (INDEPENDENT_AMBULATORY_CARE_PROVIDER_SITE_OTHER): Payer: Medicaid Other | Admitting: Nurse Practitioner

## 2018-10-27 VITALS — BP 148/89 | HR 91 | Ht 61.5 in | Wt 322.8 lb

## 2018-10-27 DIAGNOSIS — Z9989 Dependence on other enabling machines and devices: Secondary | ICD-10-CM

## 2018-10-27 DIAGNOSIS — G4733 Obstructive sleep apnea (adult) (pediatric): Secondary | ICD-10-CM

## 2018-10-27 NOTE — Telephone Encounter (Signed)
Patient's mother asked if patient can be seen in June 2020 instead of 11/13/18.

## 2018-10-27 NOTE — Patient Instructions (Signed)
CPAP compliance 100% which is excellent compliance Continue same settings Follow-up yearly Call [phone] [facility] for DME

## 2018-10-27 NOTE — Telephone Encounter (Signed)
This patient has not been seen for seizure problems since September 2018.  Probably should keep the November appointment.  She has been seen for CPAP management, but not for seizures.

## 2018-10-27 NOTE — Telephone Encounter (Signed)
Spoke with Jane Edwards and explained that since Jane Edwards has not been seen in our office in over one year, she should keep her 11/13/18 appt., check in time 0930 for 10am appt..  She verbalized understanding of same, sts. will keep Nov. appt/fim

## 2018-10-29 ENCOUNTER — Other Ambulatory Visit (HOSPITAL_COMMUNITY): Payer: Self-pay | Admitting: Psychiatry

## 2018-10-29 DIAGNOSIS — F4310 Post-traumatic stress disorder, unspecified: Secondary | ICD-10-CM

## 2018-10-31 DIAGNOSIS — N920 Excessive and frequent menstruation with regular cycle: Secondary | ICD-10-CM

## 2018-10-31 DIAGNOSIS — D500 Iron deficiency anemia secondary to blood loss (chronic): Secondary | ICD-10-CM

## 2018-11-13 ENCOUNTER — Ambulatory Visit: Payer: Medicaid Other | Admitting: Neurology

## 2018-11-13 ENCOUNTER — Encounter

## 2018-11-13 ENCOUNTER — Other Ambulatory Visit: Payer: Self-pay

## 2018-11-13 ENCOUNTER — Encounter: Payer: Self-pay | Admitting: Neurology

## 2018-11-13 VITALS — BP 122/86 | HR 92 | Resp 20 | Ht 61.5 in | Wt 316.5 lb

## 2018-11-13 DIAGNOSIS — Z5181 Encounter for therapeutic drug level monitoring: Secondary | ICD-10-CM

## 2018-11-13 DIAGNOSIS — R569 Unspecified convulsions: Secondary | ICD-10-CM

## 2018-11-13 DIAGNOSIS — G40909 Epilepsy, unspecified, not intractable, without status epilepticus: Secondary | ICD-10-CM

## 2018-11-13 DIAGNOSIS — F445 Conversion disorder with seizures or convulsions: Secondary | ICD-10-CM

## 2018-11-13 HISTORY — DX: Unspecified convulsions: R56.9

## 2018-11-13 HISTORY — DX: Conversion disorder with seizures or convulsions: F44.5

## 2018-11-13 MED ORDER — CARBAMAZEPINE 200 MG PO TABS: 200.0000 mg | ORAL_TABLET | Freq: Two times a day (BID) | ORAL | 3 refills | Status: DC

## 2018-11-13 MED ORDER — TOPIRAMATE ER 150 MG PO SPRINKLE CAP24: 300.0000 mg | EXTENDED_RELEASE_CAPSULE | Freq: Every day | ORAL | 3 refills | Status: DC
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
## 2018-11-13 NOTE — Patient Instructions (Signed)
We will start carbamazepine 200 mg tablets, start 1/2 tablet twice a day for 2 weeks, then start 1 tablet twice a day.   Tegretol (carbamazepine) may result in dizziness, gait instability, cognitive slowing, or drowsiness. Sometimes, and allergic rash may occur, or a photosensitive rash may occur. If any significant side effects are noted, please contact our office.

## 2018-11-14 ENCOUNTER — Other Ambulatory Visit (HOSPITAL_COMMUNITY): Payer: Self-pay | Admitting: Psychiatry

## 2018-11-14 ENCOUNTER — Telehealth: Payer: Self-pay | Admitting: Neurology

## 2018-11-14 DIAGNOSIS — F4310 Post-traumatic stress disorder, unspecified: Secondary | ICD-10-CM

## 2018-11-14 DIAGNOSIS — F330 Major depressive disorder, recurrent, mild: Secondary | ICD-10-CM

## 2018-11-14 LAB — COMPREHENSIVE METABOLIC PANEL
ALT: 8 IU/L (ref 0–32)
AST: 17 IU/L (ref 0–40)
Albumin/Globulin Ratio: 1.4 (ref 1.2–2.2)
Albumin: 3.9 g/dL (ref 3.5–5.5)
Alkaline Phosphatase: 81 IU/L (ref 39–117)
BUN/Creatinine Ratio: 7 — ABNORMAL LOW (ref 9–23)
BUN: 6 mg/dL (ref 6–24)
Bilirubin Total: 0.6 mg/dL (ref 0.0–1.2)
CALCIUM: 9 mg/dL (ref 8.7–10.2)
CO2: 16 mmol/L — ABNORMAL LOW (ref 20–29)
Chloride: 111 mmol/L — ABNORMAL HIGH (ref 96–106)
Creatinine, Ser: 0.81 mg/dL (ref 0.57–1.00)
GFR, EST AFRICAN AMERICAN: 104 mL/min/{1.73_m2} (ref >59)
GFR, EST NON AFRICAN AMERICAN: 90 mL/min/{1.73_m2} (ref >59)
GLUCOSE: 88 mg/dL (ref 65–99)
Globulin, Total: 2.7 g/dL (ref 1.5–4.5)
Potassium: 4.5 mmol/L (ref 3.5–5.2)
Sodium: 146 mmol/L — ABNORMAL HIGH (ref 134–144)
TOTAL PROTEIN: 6.6 g/dL (ref 6.0–8.5)

## 2018-11-14 LAB — CBC WITH DIFFERENTIAL/PLATELET
BASOS: 0 %
Basophils Absolute: 0 10*3/uL (ref 0.0–0.2)
EOS (ABSOLUTE): 0.1 10*3/uL (ref 0.0–0.4)
EOS: 1 %
HEMOGLOBIN: 8.2 g/dL — AB (ref 11.1–15.9)
Hematocrit: 28.6 % — ABNORMAL LOW (ref 34.0–46.6)
IMMATURE GRANS (ABS): 0 10*3/uL (ref 0.0–0.1)
Immature Granulocytes: 0 %
LYMPHS: 18 %
Lymphocytes Absolute: 1.4 10*3/uL (ref 0.7–3.1)
MCH: 20 pg — AB (ref 26.6–33.0)
MCHC: 28.7 g/dL — ABNORMAL LOW (ref 31.5–35.7)
MCV: 70 fL — AB (ref 79–97)
MONOCYTES: 4 %
Monocytes Absolute: 0.3 10*3/uL (ref 0.1–0.9)
NEUTROS ABS: 5.8 10*3/uL (ref 1.4–7.0)
Neutrophils: 77 %
Platelets: 382 10*3/uL (ref 150–450)
RBC: 4.1 x10E6/uL (ref 3.77–5.28)
RDW: 18 % — ABNORMAL HIGH (ref 12.3–15.4)
WBC: 7.6 10*3/uL (ref 3.4–10.8)

## 2018-11-14 LAB — TOPIRAMATE LEVEL: TOPIRAMATE LVL: NOT DETECTED ug/mL (ref 2.0–25.0)

## 2018-11-14 NOTE — Telephone Encounter (Signed)
I called and talk with the mother.  The pyramid level is 0, the mother swears that the patient is taking her medications.  Last year, the blood work showed that the [place] level was 0, the patient was supposedly taking her medications at that time.  Carbamazepine has been added to the drug regimen.  The patient has a hyperchloremic acidosis that in part is related to Topamax, but the patient has been vomiting occasionally.  The patient does have an anemia associated with a low MCV, she got an iron infusion today.

## 2018-11-22 ENCOUNTER — Other Ambulatory Visit (HOSPITAL_COMMUNITY): Payer: Self-pay | Admitting: Psychiatry

## 2018-11-22 DIAGNOSIS — F4310 Post-traumatic stress disorder, unspecified: Secondary | ICD-10-CM

## 2018-11-27 ENCOUNTER — Other Ambulatory Visit (HOSPITAL_COMMUNITY): Payer: Self-pay

## 2018-11-27 MED ORDER — DESVENLAFAXINE ER 50 MG PO TB24: ORAL_TABLET | ORAL | 1 refills | Status: DC

## 2018-12-04 ENCOUNTER — Telehealth (HOSPITAL_COMMUNITY): Payer: Self-pay

## 2018-12-04 NOTE — Telephone Encounter (Signed)
Received a Prior Authorization request for Desvenlafaxine ER 50mg . This patients insurance is Medicaid and Desvenlafaxine  ER is one of the preferred medications. I called [facility] in [place] and spoke with pharmacy tech Marita Kansas and she said that spoke with Medicaid. No prior authorization needed for this medication

## 2018-12-20 ENCOUNTER — Other Ambulatory Visit (HOSPITAL_COMMUNITY): Payer: Self-pay | Admitting: Psychiatry

## 2018-12-20 DIAGNOSIS — F330 Major depressive disorder, recurrent, mild: Secondary | ICD-10-CM

## 2018-12-20 DIAGNOSIS — F4310 Post-traumatic stress disorder, unspecified: Secondary | ICD-10-CM

## 2018-12-25 ENCOUNTER — Ambulatory Visit (INDEPENDENT_AMBULATORY_CARE_PROVIDER_SITE_OTHER): Payer: Medicaid Other | Admitting: Psychiatry

## 2018-12-25 ENCOUNTER — Encounter (HOSPITAL_COMMUNITY): Payer: Self-pay | Admitting: Psychiatry

## 2018-12-25 VITALS — BP 128/91 | HR 85 | Ht 61.5 in | Wt 314.0 lb

## 2018-12-25 DIAGNOSIS — F330 Major depressive disorder, recurrent, mild: Secondary | ICD-10-CM

## 2018-12-25 DIAGNOSIS — F4310 Post-traumatic stress disorder, unspecified: Secondary | ICD-10-CM | POA: Diagnosis not present

## 2018-12-25 MED ORDER — PRAZOSIN HCL 1 MG PO CAPS: 1.0000 mg | ORAL_CAPSULE | Freq: Every day | ORAL | 1 refills | Status: DC

## 2018-12-25 MED ORDER — DESVENLAFAXINE ER 50 MG PO TB24: 50.0000 mg | ORAL_TABLET | Freq: Every day | ORAL | 1 refills | Status: DC
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
## 2018-12-28 ENCOUNTER — Encounter (HOSPITAL_COMMUNITY): Payer: Self-pay | Admitting: Licensed Clinical Social Worker

## 2018-12-28 ENCOUNTER — Ambulatory Visit (INDEPENDENT_AMBULATORY_CARE_PROVIDER_SITE_OTHER): Payer: Medicaid Other | Admitting: Licensed Clinical Social Worker

## 2018-12-28 DIAGNOSIS — F4310 Post-traumatic stress disorder, unspecified: Secondary | ICD-10-CM | POA: Diagnosis not present
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
## 2019-01-13 ENCOUNTER — Other Ambulatory Visit (HOSPITAL_COMMUNITY): Payer: Self-pay | Admitting: Psychiatry

## 2019-01-13 DIAGNOSIS — F4310 Post-traumatic stress disorder, unspecified: Secondary | ICD-10-CM

## 2019-03-26 ENCOUNTER — Ambulatory Visit (INDEPENDENT_AMBULATORY_CARE_PROVIDER_SITE_OTHER): Payer: Medicare Other | Admitting: Psychiatry

## 2019-03-26 ENCOUNTER — Other Ambulatory Visit: Payer: Self-pay

## 2019-03-26 DIAGNOSIS — F4310 Post-traumatic stress disorder, unspecified: Secondary | ICD-10-CM | POA: Diagnosis not present

## 2019-03-26 DIAGNOSIS — F330 Major depressive disorder, recurrent, mild: Secondary | ICD-10-CM | POA: Diagnosis not present

## 2019-03-26 MED ORDER — DESVENLAFAXINE ER 50 MG PO TB24: 50.0000 mg | ORAL_TABLET | Freq: Every day | ORAL | 2 refills | Status: DC

## 2019-03-26 MED ORDER — PRAZOSIN HCL 1 MG PO CAPS: 1.0000 mg | ORAL_CAPSULE | Freq: Every day | ORAL | 2 refills | Status: DC

## 2019-03-26 NOTE — Progress Notes (Signed)
Virtual Visit via Telephone Note  I connected with Jane Edwards on 03/26/19 at 10:40 AM EDT by telephone and verified that I am speaking with the correct person using two identifiers.   I discussed the limitations, risks, security and privacy concerns of performing an evaluation and management service by telephone and the availability of in person appointments. I also discussed with the patient that there may be a patient responsible charge related to this service. The patient expressed understanding and agreed to proceed.   History of Present Illness: Patient was evaluated through phone session.  She feel improvement with increase Pristiq.  She is less anxious and less depressed.  Her nightmares and flashbacks are also less intense and less frequent.  She admitted she is sleeping a lot.  She is taking hydroxyzine 50 mg 3 times a day prescribed by PCP.  She is seeing Beth for therapy.  She is very pleased because her disability is approved.  She still has seizures on and off but they are mild and does not last for long time.  Now she is taking Topamax and Tegretol on a regular basis.  She noticed her crying spells are less intense and she is more hopeful.  She is using CPAP every night.  She like to continue current medication.  She is happy that none of the medication cause interaction with her other medication.  She is anxious about pandemic coronavirus and staying in [place] with her sister.  She does not want to travel unnecessarily.  She is tolerating her medication and reported no side effects.  She denies drinking or using any illegal substances.  Past Psychiatric History: Reviewed. H/O PTSD and anxiety disorder. Saw psychiatrist in [place] and given Minipress.We tried Paxil but stopped due to frequent urination and Prozac which causes interaction with Topamax.  No h/o suicidal attempt, inpatient treatment, psychosis, mania, hallucination or self abusive behavior.    Observations/Objective: Limited mental status examination done on the phone.  Her speech is slow but clear and coherent.  She describes her mood anxious but denies any auditory or visual hallucination.  She denies any suicidal thoughts or homicidal thought.  She denies any tremors shakes or any EPS.  There were no delusions, paranoia or any obsessive thoughts.  Her attention and concentration is fair.  She does not appear to be distracted on the phone.  She is alert and oriented x3.  Her fund of knowledge is average.  Her cognition is fairly intact.  Her insight and judgment is okay.  Assessment and Plan: Posttraumatic stress disorder.  Major depressive disorder, recurrent.  Generalized anxiety disorder.  Patient doing better since disability approved and Pristiq dose increased to 50 mg.  She noticed improvement in her mood, nightmares and flashback.  She is getting [facility] from [facility].  I recommend to cut down her hydroxyzine which she is taking 50 mg 3 times a day prescribed by PCP.  I recommend to take only as needed as this may be causing excessive sleep during the day.  Encouraged to continue therapy.  Recommended to call us back if she has any question or any concern.  I will continue Pristiq 50 mg daily and Minipress at bedtime.  Recommended to call us back if she is any question or any concern.  Follow-up in 3 months.  Follow Up Instructions:    I discussed the assessment and treatment plan with the patient. The patient was provided an opportunity to ask questions and all were answered.  The patient agreed with the plan and demonstrated an understanding of the instructions.   The patient was advised to call back or seek an in-person evaluation if the symptoms worsen or if the condition fails to improve as anticipated.  I provided 15 minutes of non-face-to-face time during this encounter.   Kathlee Nations, MD

## 2019-03-28 ENCOUNTER — Ambulatory Visit (HOSPITAL_COMMUNITY): Payer: Medicare Other | Admitting: Licensed Clinical Social Worker

## 2019-03-28 ENCOUNTER — Other Ambulatory Visit: Payer: Self-pay

## 2019-03-28 ENCOUNTER — Encounter (HOSPITAL_COMMUNITY): Payer: Self-pay | Admitting: Licensed Clinical Social Worker

## 2019-03-28 DIAGNOSIS — F330 Major depressive disorder, recurrent, mild: Secondary | ICD-10-CM

## 2019-03-28 DIAGNOSIS — F4310 Post-traumatic stress disorder, unspecified: Secondary | ICD-10-CM

## 2019-03-28 NOTE — Progress Notes (Signed)
Virtual Visit via Phone Note  I connected with Jane Edwards on 03/28/19 at 10:00 AM EDT by a telephone  telemedicine application and verified that I am speaking with the correct person using two identifiers.   I discussed the limitations of evaluation and management by telemedicine and the availability of in person appointments. The patient expressed understanding and agreed to proceed.  History of Present Illness: Pt referred to OP therapy by her psychiatrist for trauma and depression    Observations/Objective: Pt presents via telephone anxious and depressed. Develop the ability to recognize, accept and cope with feelings of depression.   Assessment and Plan: Pt reports she & her parents are currently living at her sister's house. "I have my own room and bathroom. My family takes good care of me but I feel guilty because I'm a burden." Used socratic questions reframing her negative thoughts. Emailed pt handout for socratic questions. Pt reports she got her disability and now has medicare. I explained that I could no longer be her therapist due to [employer]. She was upset due to her inability to trust new therapists. Asked open ended questions. Emailed pt a Chartered certified accountant, Guide to Living with worry and anxiety amidst global uncertainty, A Care package for Uncertain times, Podcasts by Almond [employer]. Suggested to pt she continue to journal. Pt will process having a new therapist and call within a week.   Follow Up Instructions:    I discussed the assessment and treatment plan with the patient. The patient was provided an opportunity to ask questions and all were answered. The patient agreed with the plan and demonstrated an understanding of the instructions.   The patient was advised to call back or seek an in-person evaluation if the symptoms worsen or if the condition fails to improve as anticipated.  I provided 50 minutes of non-face-to-face time during this  encounter.   [name],[name], LCAS

## 2019-03-29 DIAGNOSIS — D500 Iron deficiency anemia secondary to blood loss (chronic): Secondary | ICD-10-CM

## 2019-03-29 DIAGNOSIS — N920 Excessive and frequent menstruation with regular cycle: Secondary | ICD-10-CM | POA: Diagnosis not present

## 2019-04-10 ENCOUNTER — Other Ambulatory Visit (HOSPITAL_COMMUNITY): Payer: Self-pay

## 2019-04-10 DIAGNOSIS — F4310 Post-traumatic stress disorder, unspecified: Secondary | ICD-10-CM

## 2019-04-10 DIAGNOSIS — F330 Major depressive disorder, recurrent, mild: Secondary | ICD-10-CM

## 2019-04-16 ENCOUNTER — Ambulatory Visit (HOSPITAL_COMMUNITY): Payer: Medicaid Other | Admitting: Psychiatry

## 2019-04-19 DIAGNOSIS — N920 Excessive and frequent menstruation with regular cycle: Secondary | ICD-10-CM | POA: Diagnosis not present

## 2019-04-19 DIAGNOSIS — D500 Iron deficiency anemia secondary to blood loss (chronic): Secondary | ICD-10-CM | POA: Diagnosis not present

## 2019-05-07 ENCOUNTER — Telehealth: Payer: Self-pay

## 2019-05-07 NOTE — Telephone Encounter (Signed)
Unable to get in contact with the patient to convert their office visit with Judson Roch on 05/10/2019 into a doxy.me visit. I left a voicemail asking the patient to return my call. Office number was provided.   If patient calls back please convert their office visit into a doxy.me visit.

## 2019-05-09 NOTE — Progress Notes (Deleted)
Virtual Visit via Video Note  I connected with Jane Edwards on 05/09/19 at 10:45 AM EDT by a video enabled telemedicine application and verified that I am speaking with the correct person using two identifiers.  Location: Patient: *** Provider: ***   I discussed the limitations of evaluation and management by telemedicine and the availability of in person appointments. The patient expressed understanding and agreed to proceed.  History of Present Illness: 05/10/2019 SS:   11/13/2018 Dr. Jannifer Franklin: [name] is a 42 year old right-handed black female with a history of morbid obesity and sleep apnea on CPAP.  The patient has intractable seizure type events.  The patient recently had strabismus surgery, she has recovered from this.  This has helped her headache frequency.  She has had problems with chronic anemia, the source of the blood loss has not been determined.  She remains on extended release topiramate taking a total of 300 mg daily.  The patient continues to have generalized seizures associated with tongue biting or bowel or bladder incontinence that may occur on average once a month, the patient has more frequent events of "nodding off" where she may have nonconvulsive events.  These events may occur several times a week, and may possibly represent her pseudoseizures.  The generalized events may represent true seizure episodes.  The patient has had documented EEG studies that have recorded her clinical events without associated electrographic abnormalities.  The patient in the past has not been able to tolerate Vimpat or Dilantin.  She remains on topiramate, when last seen she was on Keppra, her blood levels were 0 although she claims that she was taking her medications.  The patient is now using a wheelchair for mobility, she has had several falls because of blackout episodes.   Observations/Objective:   Assessment and Plan:   Follow Up Instructions:    I discussed the assessment and  treatment plan with the patient. The patient was provided an opportunity to ask questions and all were answered. The patient agreed with the plan and demonstrated an understanding of the instructions.   The patient was advised to call back or seek an in-person evaluation if the symptoms worsen or if the condition fails to improve as anticipated.  I provided *** minutes of non-face-to-face time during this encounter.   Suzzanne Cloud, NP

## 2019-05-10 ENCOUNTER — Encounter: Payer: Self-pay | Admitting: Neurology

## 2019-05-10 ENCOUNTER — Other Ambulatory Visit: Payer: Self-pay

## 2019-05-10 ENCOUNTER — Ambulatory Visit: Payer: Medicaid Other | Admitting: Neurology

## 2019-05-10 ENCOUNTER — Ambulatory Visit (INDEPENDENT_AMBULATORY_CARE_PROVIDER_SITE_OTHER): Payer: Medicare Other | Admitting: Neurology

## 2019-05-10 DIAGNOSIS — G40901 Epilepsy, unspecified, not intractable, with status epilepticus: Secondary | ICD-10-CM

## 2019-05-10 MED ORDER — TOPIRAMATE ER 150 MG PO SPRINKLE CAP24: 300.0000 mg | EXTENDED_RELEASE_CAPSULE | Freq: Every day | ORAL | 0 refills | Status: DC

## 2019-05-10 NOTE — Progress Notes (Signed)
Virtual Visit via Video Note  I connected with Jane Edwards on 05/10/19 at  1:45 PM EDT by a video enabled telemedicine application and verified that I am speaking with the correct person using two identifiers.  Location: Patient: At her home  Provider: At my home    I discussed the limitations of evaluation and management by telemedicine and the availability of in person appointments. The patient expressed understanding and agreed to proceed.  History of Present Illness: 05/10/2019 SS: Jane Edwards is a 43 year old female with history of intractable seizure type events, morbid obesity and sleep apnea on CPAP.  She is currently taking carbamazepine 200 mg twice daily, Topamax ER 300 mg daily.  In the past she has been on Vimpat, Dilantin and Keppra.  She says she has limited mobility due to chronic fatigue, uses a walker or wheelchair.  She says her seizures have been under fairly good control, but they come and go.  She reports that her last seizure was 2 to 3 months ago when she had to go to [facility].  She says that she has been taking her medication, but she vomited them up.  At that time she was found to be anemic, had to have an iron infusion.  She says she is compliant with her seizure medications and that she manages her medications. She currently lives with her mom and other family members.  She does not drive a car, she is on disability.  11/13/2018 Dr. Jannifer Franklin: Jane Edwards is a 43 year old right-handed black female with a history of morbid obesity and sleep apnea on CPAP.  The patient has intractable seizure type events.  The patient recently had strabismus surgery, she has recovered from this.  This has helped her headache frequency.  She has had problems with chronic anemia, the source of the blood loss has not been determined.  She remains on extended release topiramate taking a total of 300 mg daily.  The patient continues to have generalized seizures associated with tongue  biting or bowel or bladder incontinence that may occur on average once a month, the patient has more frequent events of "nodding off" where she may have nonconvulsive events.  These events may occur several times a week, and may possibly represent her pseudoseizures.  The generalized events may represent true seizure episodes.  The patient has had documented EEG studies that have recorded her clinical events without associated electrographic abnormalities.  The patient in the past has not been able to tolerate Vimpat or Dilantin.  She remains on topiramate, when last seen she was on Keppra, her blood levels were 0 although she claims that she was taking her medications.  The patient is now using a wheelchair for mobility, she has had several falls because of blackout episodes.   Observations/Objective: In the bed, able to move about in the room, is unsteady, is morbidly obese, uses walker usually   Alert, lying in bed, answers questions appropriately, facial symmetry noted, follows commands   Assessment and Plan: 1.  Intractable seizures 2.  History of pseudoseizures  She indicates that her seizures have been under fairly good control, with the exception of a seizure 2 to 3 months ago where she presented to [facility]  She says at the time she was compliant with her medications, but she vomited her pills up leading to a seizure.  At that time she was anemic, had to get an iron infusion.  I will check lab work today to include  carbamazepine and topiramate levels.  She indicates that she does not have many Topamax ER pills left, I will provide a refill to the pharmacy.  She will come to the office in the next 1 to 2 weeks to have lab work done.   Follow Up Instructions: 6 months in office    I discussed the assessment and treatment plan with the patient. The patient was provided an opportunity to ask questions and all were answered. The patient agreed with the plan and demonstrated an  understanding of the instructions.   The patient was advised to call back or seek an in-person evaluation if the symptoms worsen or if the condition fails to improve as anticipated.  I provided 20 minutes of non-face-to-face time during this encounter.   Evangeline Dakin, DNP  [facility] [address], [place], [place] [address] [phone]

## 2019-05-10 NOTE — Progress Notes (Signed)
I have read the note, and I agree with the clinical assessment and plan.  Charles K Willis   

## 2019-05-10 NOTE — Telephone Encounter (Signed)
Pt r/s. She states she was waiting for a call for her appt this morning and never received it. Pt was r/s for a Virtual Visit for later in the afternoon. Link was texted to her smartphone. [employer].

## 2019-05-10 NOTE — Telephone Encounter (Signed)
Noted! Thank you

## 2019-05-24 ENCOUNTER — Other Ambulatory Visit (INDEPENDENT_AMBULATORY_CARE_PROVIDER_SITE_OTHER): Payer: Self-pay

## 2019-05-24 ENCOUNTER — Other Ambulatory Visit: Payer: Self-pay

## 2019-05-24 DIAGNOSIS — Z0289 Encounter for other administrative examinations: Secondary | ICD-10-CM

## 2019-05-24 DIAGNOSIS — G40901 Epilepsy, unspecified, not intractable, with status epilepticus: Secondary | ICD-10-CM

## 2019-05-28 LAB — CBC WITH DIFFERENTIAL/PLATELET
Basophils Absolute: 0.1 10*3/uL (ref 0.0–0.2)
Basos: 1 %
EOS (ABSOLUTE): 0.1 10*3/uL (ref 0.0–0.4)
Eos: 1 %
Hematocrit: 39.6 % (ref 34.0–46.6)
Hemoglobin: 12.7 g/dL (ref 11.1–15.9)
Immature Grans (Abs): 0 10*3/uL (ref 0.0–0.1)
Immature Granulocytes: 0 %
Lymphocytes Absolute: 1.4 10*3/uL (ref 0.7–3.1)
Lymphs: 16 %
MCH: 24.2 pg — ABNORMAL LOW (ref 26.6–33.0)
MCHC: 32.1 g/dL (ref 31.5–35.7)
MCV: 76 fL — ABNORMAL LOW (ref 79–97)
Monocytes Absolute: 0.5 10*3/uL (ref 0.1–0.9)
Monocytes: 5 %
Neutrophils Absolute: 6.8 10*3/uL (ref 1.4–7.0)
Neutrophils: 77 %
Platelets: 302 10*3/uL (ref 150–450)
RBC: 5.24 x10E6/uL (ref 3.77–5.28)
RDW: 19.4 % — ABNORMAL HIGH (ref 11.7–15.4)
WBC: 8.9 10*3/uL (ref 3.4–10.8)

## 2019-05-28 LAB — COMPREHENSIVE METABOLIC PANEL
ALT: 9 IU/L (ref 0–32)
AST: 15 IU/L (ref 0–40)
Albumin/Globulin Ratio: 1.5 (ref 1.2–2.2)
Albumin: 4.4 g/dL (ref 3.8–4.8)
Alkaline Phosphatase: 99 IU/L (ref 39–117)
BUN/Creatinine Ratio: 13 (ref 9–23)
BUN: 13 mg/dL (ref 6–24)
Bilirubin Total: 0.3 mg/dL (ref 0.0–1.2)
CO2: 22 mmol/L (ref 20–29)
Calcium: 9.7 mg/dL (ref 8.7–10.2)
Chloride: 100 mmol/L (ref 96–106)
Creatinine, Ser: 1.02 mg/dL — ABNORMAL HIGH (ref 0.57–1.00)
GFR calc Af Amer: 79 mL/min/{1.73_m2} (ref >59)
GFR calc non Af Amer: 68 mL/min/{1.73_m2} (ref >59)
Globulin, Total: 2.9 g/dL (ref 1.5–4.5)
Glucose: 101 mg/dL — ABNORMAL HIGH (ref 65–99)
Potassium: 3.3 mmol/L — ABNORMAL LOW (ref 3.5–5.2)
Sodium: 140 mmol/L (ref 134–144)
Total Protein: 7.3 g/dL (ref 6.0–8.5)

## 2019-05-28 LAB — TOPIRAMATE LEVEL: Topiramate Lvl: 20.7 ug/mL (ref 2.0–25.0)

## 2019-05-28 LAB — CARBAMAZEPINE LEVEL, TOTAL: Carbamazepine (Tegretol), S: 2.8 ug/mL — ABNORMAL LOW (ref 4.0–12.0)

## 2019-05-30 ENCOUNTER — Telehealth: Payer: Self-pay | Admitting: *Deleted

## 2019-05-30 NOTE — Telephone Encounter (Signed)
-----   Message from Suzzanne Cloud, NP sent at 05/30/2019 10:28 AM EDT ----- Please call the patient, ensure she is taking her carbamazepine 200 mg twice daily for seizures. Her level was <2.8. Her potassium was mildly low at 3.3

## 2019-05-30 NOTE — Telephone Encounter (Signed)
Relayed via VM that the carbamazepine level was less then 2.8 (below reference range 4-12) also potassium level was 3.3, slightly decreased from 3.5 normal.  (OJ or bananas help with potassium).  Please make sure taking carbamazepine 200mg  po bid.   She is to call back if questions.

## 2019-05-31 NOTE — Telephone Encounter (Signed)
I spoke with pt and let her know lab results.  She states she is taking carbamazepine 200mg  po bid.  Has not had any seizures since last seen.  Will be in touch with her pcp due to low K+.  She appreciated call.

## 2019-05-31 NOTE — Telephone Encounter (Signed)
LMVM for pt to return call, to make sure she message.

## 2019-06-14 ENCOUNTER — Other Ambulatory Visit: Payer: Self-pay | Admitting: Neurology

## 2019-06-15 ENCOUNTER — Other Ambulatory Visit (HOSPITAL_COMMUNITY): Payer: Self-pay | Admitting: Psychiatry

## 2019-06-15 DIAGNOSIS — F4310 Post-traumatic stress disorder, unspecified: Secondary | ICD-10-CM

## 2019-06-25 ENCOUNTER — Ambulatory Visit (INDEPENDENT_AMBULATORY_CARE_PROVIDER_SITE_OTHER): Payer: Medicare Other | Admitting: Psychiatry

## 2019-06-25 ENCOUNTER — Encounter (HOSPITAL_COMMUNITY): Payer: Self-pay | Admitting: Psychiatry

## 2019-06-25 DIAGNOSIS — F330 Major depressive disorder, recurrent, mild: Secondary | ICD-10-CM

## 2019-06-25 DIAGNOSIS — F4310 Post-traumatic stress disorder, unspecified: Secondary | ICD-10-CM | POA: Diagnosis not present

## 2019-06-25 MED ORDER — PRAZOSIN HCL 1 MG PO CAPS: 1.0000 mg | ORAL_CAPSULE | Freq: Every day | ORAL | 2 refills | Status: DC

## 2019-06-25 MED ORDER — DESVENLAFAXINE ER 100 MG PO TB24: 100.0000 mg | ORAL_TABLET | Freq: Every day | ORAL | 2 refills | Status: DC

## 2019-06-25 NOTE — Progress Notes (Signed)
Virtual Visit via Telephone Note  I connected with Jane Edwards on 06/25/19 at  4:20 PM EDT by telephone and verified that I am speaking with the correct person using two identifiers.   I discussed the limitations, risks, security and privacy concerns of performing an evaluation and management service by telephone and the availability of in person appointments. I also discussed with the patient that there may be a patient responsible charge related to this service. The patient expressed understanding and agreed to proceed.   History of Present Illness: Patient was evaluated through phone session.  She still struggle with insomnia.  Some nights she has nightmares and flashback and she noticed talking to herself.  She try to record herself but due to her limited data plan she could not record the conversation.  However she remember that she is scared in her dream and sometimes she is fighting with people.  She is using CPAP every night.  She is not able to see her therapist Charolotte Eke because of recent change in her insurance.  Now she got up disability approved and received Medicare and her therapist not able to see her.  She denies any crying spells or any feeling of hopelessness or worthlessness.  She denies any agitation, mania or psychosis.  She is taking Pristiq 50 mg, Minipress 1 mg and hydroxyzine 50 mg twice a day which is prescribed by primary care physician.  Recently she saw her neurologist and her medicine remain the same.  She takes Topamax and Tegretol for her seizures.  Her appetite is okay.  Energy level is good.  She has no tremors, shakes or any EPS.  She lives in [place] with her sister.  She does not travel unnecessary.  She denies drinking or using any illegal substances.   Past Psychiatric History:Reviewed. H/O PTSD and anxiety disorder. Saw psychiatrist in [place] and given Minipress.We tried Paxilbut stopped due to frequent urination and Prozac which causes  interaction with Topamax.Noh/o suicidal attempt,inpatient treatment,psychosis, mania, hallucination or self abusive behavior.   Psychiatric Specialty Exam: Physical Exam  ROS  There were no vitals taken for this visit.There is no height or weight on file to calculate BMI.  General Appearance: NA  Eye Contact:  NA  Speech:  Clear and Coherent and Slow  Volume:  Decreased  Mood:  Euthymic  Affect:  NA  Thought Process:  Descriptions of Associations: Intact  Orientation:  Full (Time, Place, and Person)  Thought Content:  Rumination  Suicidal Thoughts:  No  Homicidal Thoughts:  No  Memory:  Immediate;   Fair Recent;   Fair Remote;   Fair  Judgement:  Good  Insight:  Good  Psychomotor Activity:  NA  Concentration:  Concentration: Fair and Attention Span: Fair  Recall:  [employer] of Knowledge:  Good  Language:  Good  Akathisia:  No  Handed:  Right  AIMS (if indicated):     Assets:  Communication Skills Desire for Improvement Housing Resilience  ADL's:  Intact  Cognition:  WNL  Sleep:   fair      Assessment and Plan: Posttraumatic stress disorder.  Major depressive disorder, recurrent.  Generalized anxiety disorder.  Recommend to try Pristiq 100 mg to help her anxiety and depression.  She will need a therapy as currently she is not able to see her therapist due to change in her insurance.  I will continue Minipress 1 mg at bedtime however if increase Pristiq did not help her then we will  consider increasing the Minipress.  She is getting hydroxyzine from her primary care physician.  Discussed medication side effects and benefits.  Recommended to call us back if she has any question or any concern.  Follow-up in 3 months.  Follow Up Instructions:    I discussed the assessment and treatment plan with the patient. The patient was provided an opportunity to ask questions and all were answered. The patient agreed with the plan and demonstrated an understanding of the  instructions.   The patient was advised to call back or seek an in-person evaluation if the symptoms worsen or if the condition fails to improve as anticipated.  I provided 20 minutes of non-face-to-face time during this encounter.   Kathlee Nations, MD

## 2019-06-26 ENCOUNTER — Ambulatory Visit (HOSPITAL_COMMUNITY): Payer: Medicare Other | Admitting: Psychiatry

## 2019-06-26 DIAGNOSIS — D500 Iron deficiency anemia secondary to blood loss (chronic): Secondary | ICD-10-CM

## 2019-08-17 ENCOUNTER — Other Ambulatory Visit: Payer: Self-pay | Admitting: Neurology

## 2019-08-21 ENCOUNTER — Telehealth: Payer: Self-pay | Admitting: Neurology

## 2019-08-21 MED ORDER — TOPIRAMATE ER 150 MG PO SPRINKLE CAP24: 300.0000 mg | EXTENDED_RELEASE_CAPSULE | Freq: Every day | ORAL | 1 refills | Status: DC

## 2019-08-21 NOTE — Telephone Encounter (Signed)
Received approval for topiramate ER Non formulary exclusion thru 12/20/19.  [place] [id number].  [phone]. Fax confirmation received to [place].

## 2019-08-21 NOTE — Addendum Note (Signed)
Addended by: Oliver Hum S on: 08/21/2019 01:40 PM   Modules accepted: Orders

## 2019-08-21 NOTE — Telephone Encounter (Signed)
I called pharmacy and they said needed PA.  Initiated CMM for Topiramate ER KEY  [id number].  Pt has tried dilantin, topiramate, keppra, vimpat.  G 40.901.  Determination pending.

## 2019-08-21 NOTE — Telephone Encounter (Signed)
Pt is asking for a refill on her topiramate ER (QUDEXY XR) 150 MG CS24 sprinkle capsule [facility] #[id number]  pt was advised she is to contact her pharmacy 1st for a refill.  Pt states they have tried reaching out to the office

## 2019-09-03 DIAGNOSIS — D500 Iron deficiency anemia secondary to blood loss (chronic): Secondary | ICD-10-CM

## 2019-09-14 ENCOUNTER — Other Ambulatory Visit (HOSPITAL_COMMUNITY): Payer: Self-pay | Admitting: Psychiatry

## 2019-09-14 DIAGNOSIS — F4310 Post-traumatic stress disorder, unspecified: Secondary | ICD-10-CM

## 2019-09-18 ENCOUNTER — Other Ambulatory Visit (HOSPITAL_COMMUNITY): Payer: Self-pay | Admitting: Psychiatry

## 2019-09-18 DIAGNOSIS — F330 Major depressive disorder, recurrent, mild: Secondary | ICD-10-CM

## 2019-09-18 DIAGNOSIS — F4310 Post-traumatic stress disorder, unspecified: Secondary | ICD-10-CM

## 2019-09-25 ENCOUNTER — Encounter (HOSPITAL_COMMUNITY): Payer: Self-pay | Admitting: Psychiatry

## 2019-09-25 ENCOUNTER — Ambulatory Visit (INDEPENDENT_AMBULATORY_CARE_PROVIDER_SITE_OTHER): Payer: Medicare Other | Admitting: Psychiatry

## 2019-09-25 ENCOUNTER — Other Ambulatory Visit: Payer: Self-pay

## 2019-09-25 DIAGNOSIS — F330 Major depressive disorder, recurrent, mild: Secondary | ICD-10-CM

## 2019-09-25 DIAGNOSIS — F4310 Post-traumatic stress disorder, unspecified: Secondary | ICD-10-CM | POA: Diagnosis not present

## 2019-09-25 MED ORDER — DESVENLAFAXINE ER 100 MG PO TB24: 100.0000 mg | ORAL_TABLET | Freq: Every day | ORAL | 2 refills | Status: DC

## 2019-09-25 MED ORDER — PRAZOSIN HCL 1 MG PO CAPS: 1.0000 mg | ORAL_CAPSULE | Freq: Every day | ORAL | 2 refills | Status: DC

## 2019-09-25 NOTE — Progress Notes (Signed)
Virtual Visit via Telephone Note  I connected with Jane Edwards on 09/25/19 at 10:20 AM EDT by telephone and verified that I am speaking with the correct person using two identifiers.   I discussed the limitations, risks, security and privacy concerns of performing an evaluation and management service by telephone and the availability of in person appointments. I also discussed with the patient that there may be a patient responsible charge related to this service. The patient expressed understanding and agreed to proceed.   History of Present Illness: Patient was evaluated through phone session.  On her last visit we increase her Pristiq to 100 mg as she still struggle with anxiety, nightmares and flashback.  She is doing much better.  She is also using CPAP and hydroxyzine which is provided by her primary care physician.  She denies any mania, psychosis or any hallucination.  She lives with her sister in [place] and she also talks to her mother on the phone few times a week.  Patient reported no side effects including tremors shakes or any EPS.  She also takes Minipress which really helps her nightmares.  Patient denies drinking or using any illegal substances.  She denies any paranoia, hallucination, suicidal thoughts.  She like to continue current medication.  However she is hoping if she can resume therapy for her coping skills.  She is to see Eustaquio Maize but due to change in her insurance she could not see her anymore.   Past Psychiatric History:Reviewed. H/OPTSD and anxiety disorder.[place]rist [place] andgivenMinipress.We tried Paxilbutstopped due to frequent urination and Prozac which causes interaction with Topamax.Noh/osuicidal attempt,inpatient treatment,psychosis, mania, hallucination or self abusive behavior.   Psychiatric Specialty Exam: Physical Exam  ROS  There were no vitals taken for this visit.There is no height or weight on file to calculate BMI.   General Appearance: NA  Eye Contact:  NA  Speech:  Clear and Coherent and Slow  Volume:  Normal  Mood:  Euthymic  Affect:  NA  Thought Process:  Goal Directed  Orientation:  Full (Time, Place, and Person)  Thought Content:  WDL and Logical  Suicidal Thoughts:  No  Homicidal Thoughts:  No  Memory:  Immediate;   Good Recent;   Good Remote;   Fair  Judgement:  Good  Insight:  Good  Psychomotor Activity:  NA  Concentration:  Concentration: Fair and Attention Span: Fair  Recall:  [employer] of Knowledge:  Good  Language:  Good  Akathisia:  No  Handed:  Right  AIMS (if indicated):     Assets:  Communication Skills Desire for Improvement Housing Resilience  ADL's:  Intact  Cognition:  WNL  Sleep:   improved      Assessment and Plan: Posttraumatic stress disorder.  Major depressive disorder, recurrent.  Patient doing better since Pristiq dose increased to 100.  She is taking Minipress and also using CPAP every night which is helping her sleep.  She was prescribed hydroxyzine by primary care physician which she takes 1 at bedtime and second if needed.  Discussed medication side effects and benefits.  Continue Pristiq 100 mg daily, Minipress 1 mg at bedtime.  Patient like to resume therapy and we will provide names of the therapist.  Recommended to call us back if she has any question or any concern.  Follow-up in 3 months.  Follow Up Instructions:    I discussed the assessment and treatment plan with the patient. The patient was provided an opportunity to ask  questions and all were answered. The patient agreed with the plan and demonstrated an understanding of the instructions.   The patient was advised to call back or seek an in-person evaluation if the symptoms worsen or if the condition fails to improve as anticipated.  I provided 20 minutes of non-face-to-face time during this encounter.   Kathlee Nations, MD

## 2019-12-03 ENCOUNTER — Telehealth: Payer: Self-pay

## 2019-12-03 NOTE — Telephone Encounter (Signed)
Pending approval for Topiramate ER 150 mg  Key: [id number]   I will update once a decision has been made

## 2019-12-18 NOTE — Telephone Encounter (Signed)
Received a approval letter for Topiramate. Approved through 12/19/2020. A copy of the approval letter has been faxed to the patient's pharmacy. Confirmation fax has been received.

## 2019-12-22 ENCOUNTER — Other Ambulatory Visit (HOSPITAL_COMMUNITY): Payer: Self-pay | Admitting: Psychiatry

## 2019-12-22 DIAGNOSIS — F4310 Post-traumatic stress disorder, unspecified: Secondary | ICD-10-CM

## 2019-12-26 ENCOUNTER — Ambulatory Visit (INDEPENDENT_AMBULATORY_CARE_PROVIDER_SITE_OTHER): Payer: Medicare Other | Admitting: Psychiatry

## 2019-12-26 ENCOUNTER — Encounter (HOSPITAL_COMMUNITY): Payer: Self-pay | Admitting: Psychiatry

## 2019-12-26 ENCOUNTER — Other Ambulatory Visit: Payer: Self-pay

## 2019-12-26 DIAGNOSIS — F330 Major depressive disorder, recurrent, mild: Secondary | ICD-10-CM | POA: Diagnosis not present

## 2019-12-26 DIAGNOSIS — F4310 Post-traumatic stress disorder, unspecified: Secondary | ICD-10-CM

## 2019-12-26 MED ORDER — DESVENLAFAXINE ER 100 MG PO TB24: 100.0000 mg | ORAL_TABLET | Freq: Every day | ORAL | 2 refills | Status: DC

## 2019-12-26 MED ORDER — PRAZOSIN HCL 1 MG PO CAPS: 1.0000 mg | ORAL_CAPSULE | Freq: Every day | ORAL | 2 refills | Status: DC

## 2019-12-26 NOTE — Progress Notes (Signed)
Virtual Visit via Telephone Note  I connected with Jane Edwards on 12/26/19 at 10:20 AM EST by telephone and verified that I am speaking with the correct person using two identifiers.   I discussed the limitations, risks, security and privacy concerns of performing an evaluation and management service by telephone and the availability of in person appointments. I also discussed with the patient that there may be a patient responsible charge related to this service. The patient expressed understanding and agreed to proceed.   History of Present Illness: Patient was evaluated through phone session.  She is doing very well since we increase the Pristiq dose.  She is sleeping better.  She is sleeping at least 9 to 10 hours with the help of CPAP machine.  She also reported that she does not talk to herself in the sleep and she feels that she is not having any nightmares or dreams.  She lives with her sister in [place] and she also talks to her mother on the phone few times a week.  Patient is hoping that they may go to a bigger house soon as they are looking actively.  She denies any hallucination, paranoia or any suicidal thoughts.  She had a good Christmas and she was able to see family members.  She denies any tremors, shakes or any EPS.  She denies any crying spells or any feeling of hopelessness.  She wants to continue Pristiq and Minipress which is helping her symptoms.   Past Psychiatric History:Reviewed. H/OPTSD and anxiety disorder.[place] andgivenMinipress.We tried Paxilbutstopped due to frequent urination and Prozac which causes interaction with Topamax.Noh/osuicidal attempt,inpatient treatment,psychosis, mania, hallucination or self abusive behavior.     Psychiatric Specialty Exam: Physical Exam  Review of Systems  There were no vitals taken for this visit.There is no height or weight on file to calculate BMI.  General Appearance: NA  Eye  Contact:  NA  Speech:  Clear and Coherent  Volume:  Normal  Mood:  Euthymic  Affect:  NA  Thought Process:  Goal Directed  Orientation:  Full (Time, Place, and Person)  Thought Content:  WDL  Suicidal Thoughts:  No  Homicidal Thoughts:  No  Memory:  Immediate;   Good Recent;   Good Remote;   Good  Judgement:  Intact  Insight:  Present  Psychomotor Activity:  NA  Concentration:  Concentration: Fair and Attention Span: Fair  Recall:  Good  Fund of Knowledge:  Fair  Language:  Good  Akathisia:  No  Handed:  Right  AIMS (if indicated):     Assets:  Communication Skills Desire for Improvement Housing Social Support  ADL's:  Intact  Cognition:  WNL  Sleep:   good with CPAP      Assessment and Plan: Posttraumatic stress disorder.  Major depressive disorder, recurrent.  Patient is a stable on her current dose.  Encouraged to continue to use CPAP and take her medication since it is working.  At this time patient is not interested in therapy since symptoms are under control.  Continue Pristiq 100 mg daily and Minipress 1 mg at bedtime.  Recommended to call us back if she has any question or any concern.  Follow-up in 3 months.  Follow Up Instructions:    I discussed the assessment and treatment plan with the patient. The patient was provided an opportunity to ask questions and all were answered. The patient agreed with the plan and demonstrated an understanding of the instructions.  The patient was advised to call back or seek an in-person evaluation if the symptoms worsen or if the condition fails to improve as anticipated.  I provided 20 minutes of non-face-to-face time during this encounter.   Kathlee Nations, MD

## 2019-12-27 ENCOUNTER — Telehealth (HOSPITAL_COMMUNITY): Payer: Self-pay

## 2019-12-27 NOTE — Telephone Encounter (Signed)
Received a fax from the pharmacy stating that patient's Desvenlafaxine ER 100mg  is not covered and an alternative is requested. Please advise. Thank you.

## 2019-12-27 NOTE — Telephone Encounter (Signed)
She can try Effexor 75 mg daily. If no improved than we need to PA.

## 2020-01-03 DIAGNOSIS — D500 Iron deficiency anemia secondary to blood loss (chronic): Secondary | ICD-10-CM

## 2020-01-03 NOTE — Telephone Encounter (Signed)
Spoke with patient and she's doing well on her medications

## 2020-01-07 ENCOUNTER — Other Ambulatory Visit: Payer: Self-pay

## 2020-01-07 ENCOUNTER — Encounter: Payer: Self-pay | Admitting: Neurology

## 2020-01-07 ENCOUNTER — Ambulatory Visit (INDEPENDENT_AMBULATORY_CARE_PROVIDER_SITE_OTHER): Payer: Medicare Other | Admitting: Neurology

## 2020-01-07 VITALS — BP 139/89 | HR 86 | Temp 97.4°F | Ht 61.5 in

## 2020-01-07 DIAGNOSIS — R569 Unspecified convulsions: Secondary | ICD-10-CM

## 2020-01-07 DIAGNOSIS — F445 Conversion disorder with seizures or convulsions: Secondary | ICD-10-CM

## 2020-01-07 DIAGNOSIS — G40309 Generalized idiopathic epilepsy and epileptic syndromes, not intractable, without status epilepticus: Secondary | ICD-10-CM | POA: Diagnosis not present

## 2020-01-07 MED ORDER — TOPIRAMATE ER 150 MG PO SPRINKLE CAP24: 300.0000 mg | EXTENDED_RELEASE_CAPSULE | Freq: Every day | ORAL | 1 refills | Status: DC

## 2020-01-07 MED ORDER — CARBAMAZEPINE 200 MG PO TABS: 200.0000 mg | ORAL_TABLET | Freq: Two times a day (BID) | ORAL | 11 refills | Status: DC

## 2020-01-07 NOTE — Patient Instructions (Addendum)
   Restart the carbamazepine, start taking 100 mg twice daily x 1 week, then take 200 mg twice daily   Continue taking topamax ER   Return in 6 months
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
## 2020-01-07 NOTE — Progress Notes (Signed)
I have read the note, and I agree with the clinical assessment and plan.  Jane Edwards   

## 2020-01-10 ENCOUNTER — Telehealth: Payer: Self-pay | Admitting: Neurology

## 2020-01-10 NOTE — Telephone Encounter (Signed)
I received lab work from the primary doctor that was collected 01/03/2020, potassium was low 2.8, glucose 123, creatinine 1.00, hemoglobin 13.5, WBC 7.6, platelet 318, vitamin D 37.8, B12 402, normal AST and ALT, alkaline phosphatase was normal.

## 2020-03-16 ENCOUNTER — Other Ambulatory Visit (HOSPITAL_COMMUNITY): Payer: Self-pay | Admitting: Psychiatry

## 2020-03-16 DIAGNOSIS — F4310 Post-traumatic stress disorder, unspecified: Secondary | ICD-10-CM

## 2020-03-25 ENCOUNTER — Other Ambulatory Visit: Payer: Self-pay

## 2020-03-25 ENCOUNTER — Encounter (HOSPITAL_COMMUNITY): Payer: Self-pay | Admitting: Psychiatry

## 2020-03-25 ENCOUNTER — Ambulatory Visit (INDEPENDENT_AMBULATORY_CARE_PROVIDER_SITE_OTHER): Payer: Medicare Other | Admitting: Psychiatry

## 2020-03-25 DIAGNOSIS — F4310 Post-traumatic stress disorder, unspecified: Secondary | ICD-10-CM

## 2020-03-25 DIAGNOSIS — F330 Major depressive disorder, recurrent, mild: Secondary | ICD-10-CM | POA: Diagnosis not present

## 2020-03-25 MED ORDER — PRAZOSIN HCL 1 MG PO CAPS: 1.0000 mg | ORAL_CAPSULE | Freq: Every day | ORAL | 2 refills | Status: DC

## 2020-03-25 MED ORDER — DESVENLAFAXINE ER 100 MG PO TB24: 100.0000 mg | ORAL_TABLET | Freq: Every day | ORAL | 2 refills | Status: DC

## 2020-03-25 NOTE — Progress Notes (Signed)
Virtual Visit via Telephone Note  I connected with Jane Edwards on 03/25/20 at 10:20 AM EDT by telephone and verified that I am speaking with the correct person using two identifiers.   I discussed the limitations, risks, security and privacy concerns of performing an evaluation and management service by telephone and the availability of in person appointments. I also discussed with the patient that there may be a patient responsible charge related to this service. The patient expressed understanding and agreed to proceed.   History of Present Illness: Patient was evaluated through phone session.  She is stable on her current medication.  She is no longer taking Lopressor, hydrochlorothiazide but compliant with seizure medicines and also taking Minipress and Pristiq.  She reported in the beginning having issues with the insurance but now she is getting it regularly.  She denies any hallucination, paranoia or any suicidal thoughts.  Her sleep is good she still sometimes have nightmares but they are not as bad.  Energy level is good.  She like to continue Pristiq and Minipress.  She is in a process of getting therapy and she had contact her PCP Jane Edwards to help schedule a therapy appointment.  We have provided names but she preferred to contact her PCP to get the therapy schedule.  She has no tremors shakes or any EPS.  Past Psychiatric History:Reviewed. H/OPTSD and anxiety disorder.[place] and[place]Minipress.We tried Paxilbutstopped due to frequent urination and Prozac which causes interaction with Topamax.Noh/osuicidal attempt,inpatient treatment,psychosis, mania, hallucination or self abusive behavior.    Psychiatric Specialty Exam: Physical Exam  Review of Systems  There were no vitals taken for this visit.There is no height or weight on file to calculate BMI.  General Appearance: NA  Eye Contact:  NA  Speech:  Clear and Coherent  Volume:  Normal   Mood:  Euthymic  Affect:  NA  Thought Process:  Goal Directed  Orientation:  Full (Time, Place, and Person)  Thought Content:  WDL  Suicidal Thoughts:  No  Homicidal Thoughts:  No  Memory:  Immediate;   Good Recent;   Good Remote;   Good  Judgement:  Good  Insight:  Good  Psychomotor Activity:  NA  Concentration:  Concentration: Good and Attention Span: Good  Recall:  Good  Fund of Knowledge:  Good  Language:  Good  Akathisia:  No  Handed:  Right  AIMS (if indicated):     Assets:  Communication Skills Desire for Improvement Housing Resilience  ADL's:  Intact  Cognition:  WNL  Sleep:   ok with CPAP      Assessment and Plan: PTSD.  Major depressive disorder, recurrent.  Patient is stable on her current medication.  She is no longer taking Lopressor, hydrochlorothiazide but is still taking Norvasc and carvedilol to help her blood pressure.  I explained that she is also taking Minipress which is antihypertensive and she should discuss with her PCP if any of her other medication dose can be reduced.  But so far she feels her blood pressure is much better and she does not have any dizziness, grogginess or postural hypotension.  She like to continue her current medication.  Continue Minipress 1 mg at bedtime and Pristiq 100 mg daily.  She will follow up with her PCP to schedule therapy appointment.  Discussed medication side effects and benefits.  Recommended to call us back if she has any question of any concern.  Follow-up in 3 months.  Follow Up Instructions:  I discussed the assessment and treatment plan with the patient. The patient was provided an opportunity to ask questions and all were answered. The patient agreed with the plan and demonstrated an understanding of the instructions.   The patient was advised to call back or seek an in-person evaluation if the symptoms worsen or if the condition fails to improve as anticipated.  I provided 20 minutes of non-face-to-face  time during this encounter.   Kathlee Nations, MD

## 2020-04-25 ENCOUNTER — Other Ambulatory Visit: Payer: Self-pay | Admitting: Neurology

## 2020-04-29 MED ORDER — TOPIRAMATE 100 MG PO TABS: 150.0000 mg | ORAL_TABLET | Freq: Two times a day (BID) | ORAL | 3 refills | Status: DC

## 2020-04-29 NOTE — Telephone Encounter (Signed)
Spoke to mother of pt.  topiramate 150mg  po bid option (as far as she knows no problem) since for seizures take BID vs once daily dosing?

## 2020-04-29 NOTE — Addendum Note (Signed)
Addended by: Suzzanne Cloud on: 04/29/2020 02:01 PM   Modules accepted: Orders

## 2020-04-29 NOTE — Telephone Encounter (Signed)
Topiramate ER 150mg  backordered (pt on 300mg  ER daily).  Pharmacy sent this prescription, please advise.

## 2020-04-29 NOTE — Telephone Encounter (Signed)
Okay will send in Topamax 100 mg, 1.5 tablets twice daily.

## 2020-04-29 NOTE — Addendum Note (Signed)
Addended by: Suzzanne Cloud on: 04/29/2020 01:31 PM   Modules accepted: Orders

## 2020-04-29 NOTE — Telephone Encounter (Signed)
Topamax ER 150 mg is apparently backordered, is the case for the 100 mg capsule as well? The patient is taking 300 mg daily. If the patient can tolerate regular Topamax I suppose she could switch to 150 mg twice daily, I am not sure if previous intolerance was reported?

## 2020-04-30 DIAGNOSIS — E559 Vitamin D deficiency, unspecified: Secondary | ICD-10-CM | POA: Diagnosis not present

## 2020-04-30 DIAGNOSIS — Z79899 Other long term (current) drug therapy: Secondary | ICD-10-CM | POA: Diagnosis not present

## 2020-04-30 DIAGNOSIS — D500 Iron deficiency anemia secondary to blood loss (chronic): Secondary | ICD-10-CM | POA: Diagnosis not present

## 2020-05-07 DIAGNOSIS — D500 Iron deficiency anemia secondary to blood loss (chronic): Secondary | ICD-10-CM | POA: Diagnosis not present

## 2020-05-09 DIAGNOSIS — Z79899 Other long term (current) drug therapy: Secondary | ICD-10-CM | POA: Diagnosis not present

## 2020-05-15 ENCOUNTER — Other Ambulatory Visit (HOSPITAL_COMMUNITY): Payer: Self-pay | Admitting: Psychiatry

## 2020-05-15 DIAGNOSIS — F4310 Post-traumatic stress disorder, unspecified: Secondary | ICD-10-CM

## 2020-05-15 DIAGNOSIS — F330 Major depressive disorder, recurrent, mild: Secondary | ICD-10-CM

## 2020-06-13 ENCOUNTER — Other Ambulatory Visit (HOSPITAL_COMMUNITY): Payer: Self-pay | Admitting: Psychiatry

## 2020-06-13 DIAGNOSIS — F4310 Post-traumatic stress disorder, unspecified: Secondary | ICD-10-CM

## 2020-06-24 ENCOUNTER — Other Ambulatory Visit: Payer: Self-pay

## 2020-06-24 ENCOUNTER — Telehealth (INDEPENDENT_AMBULATORY_CARE_PROVIDER_SITE_OTHER): Payer: Medicare Other | Admitting: Psychiatry

## 2020-06-24 ENCOUNTER — Encounter (HOSPITAL_COMMUNITY): Payer: Self-pay | Admitting: Psychiatry

## 2020-06-24 DIAGNOSIS — F330 Major depressive disorder, recurrent, mild: Secondary | ICD-10-CM | POA: Diagnosis not present

## 2020-06-24 DIAGNOSIS — F4310 Post-traumatic stress disorder, unspecified: Secondary | ICD-10-CM | POA: Diagnosis not present

## 2020-06-24 MED ORDER — DESVENLAFAXINE ER 100 MG PO TB24: 100.0000 mg | ORAL_TABLET | Freq: Every day | ORAL | 2 refills | Status: DC

## 2020-06-24 MED ORDER — PRAZOSIN HCL 1 MG PO CAPS: 1.0000 mg | ORAL_CAPSULE | Freq: Every day | ORAL | 2 refills | Status: DC

## 2020-06-24 NOTE — Progress Notes (Signed)
Virtual Visit via Telephone Note  I connected with Jane Edwards on 06/24/20 at 10:20 AM EDT by telephone and verified that I am speaking with the correct person using two identifiers.  Location: Patient: home Provider: Home office   I discussed the limitations, risks, security and privacy concerns of performing an evaluation and management service by telephone and the availability of in person appointments. I also discussed with the patient that there may be a patient responsible charge related to this service. The patient expressed understanding and agreed to proceed.   History of Present Illness:.  Patient is evaluated by phone session.  She endorsed multiple health issues including blood pressure, seizures but so far she is compliant with all her medication.  She still have occasional nightmares and flashback.  She saw her PCP Shanon Ace and address those chronic health issues.  She has not able to schedule appointment to see a therapist but now working on her weight gain.  Patient reported since she received Depo shot she had gain a lot of weight and now she is trying to get nutrition consult for weight loss.  She also scheduled to see Dr. Jannifer Franklin in few weeks.  Her last seizure was last month.  She is hoping exercise and compliant with weight loss program help with reducing weight.  She does not go outside unless it is important.  She usually takes hydroxyzine when she go outside which is given by PCP.  She lives with her parents.  She has no tremor shakes or any EPS.  She is taking amlodipine and carvedilol for blood pressure.  She reported that she discussed with her PCP about her blood pressure medication and that she is taking Minipress for months and her PCP has no concerns.  Past Psychiatric History:Reviewed. H/OPTSD and anxiety disorder.[place]rist in [place] andgivenMinipress.We tried Paxilbutstopped due to frequent urination and Prozac which causes  interaction with Topamax.Noh/osuicidal attempt,inpatient treatment,psychosis, mania, hallucination or self abusive behavior.     Psychiatric Specialty Exam: Physical Exam  Review of Systems  Weight (!) 345 lb (156.5 kg).There is no height or weight on file to calculate BMI.  General Appearance: NA  Eye Contact:  NA  Speech:  Clear and Coherent and Slow  Volume:  Normal  Mood:  Euthymic  Affect:  NA  Thought Process:  Goal Directed  Orientation:  Full (Time, Place, and Person)  Thought Content:  WDL  Suicidal Thoughts:  No  Homicidal Thoughts:  No  Memory:  Immediate;   Good Recent;   Good Remote;   Good  Judgement:  Intact  Insight:  Present  Psychomotor Activity:  NA  Concentration:  Concentration: Fair and Attention Span: Fair  Recall:  Good  Fund of Knowledge:  Good  Language:  Good  Akathisia:  No  Handed:  Right  AIMS (if indicated):     Assets:  Desire for Improvement Housing  ADL's:  Intact  Cognition:  WNL  Sleep:   fair      Assessment and Plan: PTSD.  Major depressive disorder, recurrent.  Patient had gained a lot of weight since she is on Depo injection.  She is trying nutrition consult and working on her weight loss or gain.  She started walking with the help of walker.  Encouraged healthy lifestyle and watch her caloric intake.  She is taking hydroxyzine 50 mg tablet only when she leaves the house to help her anxiety prescribed by PCP.  She do not recall any recent postural  hypotension, dizziness and grogginess.  Discussed medication side effects and benefits.  Continue Minipress 1 mg at bedtime and Pristiq 20 mg daily.  Reinforced to follow-up with her PCP to schedule therapy appointment.  Recommended to call us back if she has any question, concern or if she feels worsening of the symptom.  Follow-up in 3 months.  Time spent 20 minutes.  Follow Up Instructions:    I discussed the assessment and treatment plan with the patient. The patient was  provided an opportunity to ask questions and all were answered. The patient agreed with the plan and demonstrated an understanding of the instructions.   The patient was advised to call back or seek an in-person evaluation if the symptoms worsen or if the condition fails to improve as anticipated.  I provided 20 minutes of non-face-to-face time during this encounter.   Kathlee Nations, MD

## 2020-07-04 DIAGNOSIS — R2689 Other abnormalities of gait and mobility: Secondary | ICD-10-CM | POA: Diagnosis not present

## 2020-07-04 DIAGNOSIS — M545 Low back pain: Secondary | ICD-10-CM | POA: Diagnosis not present

## 2020-07-04 DIAGNOSIS — R569 Unspecified convulsions: Secondary | ICD-10-CM | POA: Diagnosis not present

## 2020-07-09 ENCOUNTER — Other Ambulatory Visit: Payer: Self-pay

## 2020-07-09 ENCOUNTER — Ambulatory Visit (INDEPENDENT_AMBULATORY_CARE_PROVIDER_SITE_OTHER): Payer: Medicare Other | Admitting: Neurology

## 2020-07-09 ENCOUNTER — Encounter: Payer: Self-pay | Admitting: Neurology

## 2020-07-09 VITALS — BP 133/76 | HR 82 | Ht 61.0 in | Wt 360.0 lb

## 2020-07-09 DIAGNOSIS — G8929 Other chronic pain: Secondary | ICD-10-CM

## 2020-07-09 DIAGNOSIS — R519 Headache, unspecified: Secondary | ICD-10-CM

## 2020-07-09 DIAGNOSIS — G40909 Epilepsy, unspecified, not intractable, without status epilepticus: Secondary | ICD-10-CM | POA: Diagnosis not present

## 2020-07-09 DIAGNOSIS — Z5181 Encounter for therapeutic drug level monitoring: Secondary | ICD-10-CM | POA: Diagnosis not present
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
## 2020-07-10 ENCOUNTER — Telehealth: Payer: Self-pay | Admitting: Neurology

## 2020-07-10 LAB — CBC WITH DIFFERENTIAL/PLATELET
Basophils Absolute: 0 10*3/uL (ref 0.0–0.2)
Basos: 1 %
EOS (ABSOLUTE): 0.1 10*3/uL (ref 0.0–0.4)
Eos: 2 %
Hematocrit: 41.1 % (ref 34.0–46.6)
Hemoglobin: 12.6 g/dL (ref 11.1–15.9)
Immature Grans (Abs): 0 10*3/uL (ref 0.0–0.1)
Immature Granulocytes: 0 %
Lymphocytes Absolute: 1.6 10*3/uL (ref 0.7–3.1)
Lymphs: 21 %
MCH: 23.9 pg — ABNORMAL LOW (ref 26.6–33.0)
MCHC: 30.7 g/dL — ABNORMAL LOW (ref 31.5–35.7)
MCV: 78 fL — ABNORMAL LOW (ref 79–97)
Monocytes Absolute: 0.4 10*3/uL (ref 0.1–0.9)
Monocytes: 6 %
Neutrophils Absolute: 5.4 10*3/uL (ref 1.4–7.0)
Neutrophils: 70 %
Platelets: 288 10*3/uL (ref 150–450)
RBC: 5.28 x10E6/uL (ref 3.77–5.28)
RDW: 16.5 % — ABNORMAL HIGH (ref 11.7–15.4)
WBC: 7.6 10*3/uL (ref 3.4–10.8)

## 2020-07-10 LAB — COMPREHENSIVE METABOLIC PANEL
ALT: 11 IU/L (ref 0–32)
AST: 13 IU/L (ref 0–40)
Albumin/Globulin Ratio: 1.3 (ref 1.2–2.2)
Albumin: 3.9 g/dL (ref 3.8–4.8)
Alkaline Phosphatase: 103 IU/L (ref 48–121)
BUN/Creatinine Ratio: 9 (ref 9–23)
BUN: 8 mg/dL (ref 6–24)
Bilirubin Total: 0.3 mg/dL (ref 0.0–1.2)
CO2: 17 mmol/L — ABNORMAL LOW (ref 20–29)
Calcium: 9.2 mg/dL (ref 8.7–10.2)
Chloride: 110 mmol/L — ABNORMAL HIGH (ref 96–106)
Creatinine, Ser: 0.9 mg/dL (ref 0.57–1.00)
GFR calc Af Amer: 91 mL/min/{1.73_m2} (ref >59)
GFR calc non Af Amer: 79 mL/min/{1.73_m2} (ref >59)
Globulin, Total: 3 g/dL (ref 1.5–4.5)
Glucose: 101 mg/dL — ABNORMAL HIGH (ref 65–99)
Potassium: 4.7 mmol/L (ref 3.5–5.2)
Sodium: 141 mmol/L (ref 134–144)
Total Protein: 6.9 g/dL (ref 6.0–8.5)

## 2020-07-10 LAB — TOPIRAMATE LEVEL: Topiramate Lvl: 7.6 ug/mL (ref 2.0–25.0)

## 2020-07-10 LAB — CARBAMAZEPINE LEVEL, TOTAL: Carbamazepine (Tegretol), S: 4 ug/mL (ref 4.0–12.0)

## 2020-07-10 MED ORDER — CARBAMAZEPINE 200 MG PO TABS
300.0000 mg | ORAL_TABLET | Freq: Two times a day (BID) | ORAL | 3 refills | Status: DC
Start: 2020-07-10 — End: 2021-02-05

## 2020-07-10 NOTE — Telephone Encounter (Signed)
I called the patient.  Blood work reveals a hyperchloremic metabolic acidosis that likely is related to the use of Qudexy.  CBC is unremarkable exception of a low MCV.  The Tegretol level is in the low therapeutic range, we will increase the dose to 300 mg twice daily.  The topiramate level is in the therapeutic range.  No change in dosing.  I talked with the patient.

## 2020-07-22 ENCOUNTER — Other Ambulatory Visit: Payer: Self-pay | Admitting: Neurology

## 2020-07-28 DIAGNOSIS — G4733 Obstructive sleep apnea (adult) (pediatric): Secondary | ICD-10-CM | POA: Diagnosis not present

## 2020-08-04 DIAGNOSIS — D500 Iron deficiency anemia secondary to blood loss (chronic): Secondary | ICD-10-CM | POA: Diagnosis not present

## 2020-08-06 DIAGNOSIS — R2689 Other abnormalities of gait and mobility: Secondary | ICD-10-CM | POA: Diagnosis not present

## 2020-08-06 DIAGNOSIS — G4733 Obstructive sleep apnea (adult) (pediatric): Secondary | ICD-10-CM | POA: Diagnosis not present

## 2020-08-06 DIAGNOSIS — R569 Unspecified convulsions: Secondary | ICD-10-CM | POA: Diagnosis not present

## 2020-08-06 DIAGNOSIS — R269 Unspecified abnormalities of gait and mobility: Secondary | ICD-10-CM | POA: Diagnosis not present

## 2020-08-06 DIAGNOSIS — M545 Low back pain: Secondary | ICD-10-CM | POA: Diagnosis not present

## 2020-09-12 DIAGNOSIS — R2689 Other abnormalities of gait and mobility: Secondary | ICD-10-CM | POA: Diagnosis not present

## 2020-09-12 DIAGNOSIS — R569 Unspecified convulsions: Secondary | ICD-10-CM | POA: Diagnosis not present

## 2020-09-12 DIAGNOSIS — M545 Low back pain: Secondary | ICD-10-CM | POA: Diagnosis not present

## 2020-09-17 ENCOUNTER — Other Ambulatory Visit (HOSPITAL_COMMUNITY): Payer: Self-pay | Admitting: Psychiatry

## 2020-09-17 DIAGNOSIS — F4310 Post-traumatic stress disorder, unspecified: Secondary | ICD-10-CM

## 2020-09-22 DIAGNOSIS — I10 Essential (primary) hypertension: Secondary | ICD-10-CM | POA: Diagnosis not present

## 2020-09-22 DIAGNOSIS — D500 Iron deficiency anemia secondary to blood loss (chronic): Secondary | ICD-10-CM | POA: Diagnosis not present

## 2020-09-22 DIAGNOSIS — Z452 Encounter for adjustment and management of vascular access device: Secondary | ICD-10-CM | POA: Diagnosis not present

## 2020-09-22 DIAGNOSIS — R220 Localized swelling, mass and lump, head: Secondary | ICD-10-CM | POA: Diagnosis not present

## 2020-09-23 ENCOUNTER — Encounter: Payer: Self-pay | Admitting: Pharmacist

## 2020-09-24 ENCOUNTER — Other Ambulatory Visit: Payer: Self-pay

## 2020-09-24 ENCOUNTER — Telehealth (INDEPENDENT_AMBULATORY_CARE_PROVIDER_SITE_OTHER): Payer: Medicare Other | Admitting: Psychiatry

## 2020-09-24 ENCOUNTER — Encounter (HOSPITAL_COMMUNITY): Payer: Self-pay | Admitting: Psychiatry

## 2020-09-24 DIAGNOSIS — F4310 Post-traumatic stress disorder, unspecified: Secondary | ICD-10-CM | POA: Diagnosis not present

## 2020-09-24 DIAGNOSIS — F330 Major depressive disorder, recurrent, mild: Secondary | ICD-10-CM

## 2020-09-24 MED ORDER — DESVENLAFAXINE ER 100 MG PO TB24: 100.0000 mg | ORAL_TABLET | Freq: Every day | ORAL | 2 refills | Status: DC

## 2020-09-24 MED ORDER — PRAZOSIN HCL 1 MG PO CAPS: 1.0000 mg | ORAL_CAPSULE | Freq: Every day | ORAL | 2 refills | Status: DC

## 2020-09-24 NOTE — Progress Notes (Signed)
Virtual Visit via Telephone Note  I connected with Jane Edwards on 09/24/20 at 10:20 AM EDT by telephone and verified that I am speaking with the correct person using two identifiers.  Location: Patient: home Provider: home office   I discussed the limitations, risks, security and privacy concerns of performing an evaluation and management service by telephone and the availability of in person appointments. I also discussed with the patient that there may be a patient responsible charge related to this service. The patient expressed understanding and agreed to proceed.   History of Present Illness: Patient is evaluated by phone session.  She continued to have chronic health issues and she developed swelling and needed to see physician who provide some medicine and now swelling has reduced.  She lost 10 kg weight.  She also reported some time visual hallucination and sleeping too much.  She reported her nightmares and flashbacks are not as bad but concerned about too much sleep.  She is not sure what triggered but I reviewed the notes and now she is taking higher dose of Tegretol prescribed by Dr. Jannifer Edwards.  Patient had a seizure and dose was increased to help her seizures.  She is sleeping at least 10 hours.  She has not able to schedule appointment with therapist but she has appointment with her PCP in 2 weeks and she is going to ask her nurse practitioner for therapy appointment.  Since swelling reduced she is walking and she is more active.  Her anxiety is under control with Pristiq.  She has no tremors or shakes.  She lives with her parents.  She has no tremors or any EPS.  She like to keep her current medication.  Past Psychiatric History:Reviewed. H/OPTSD and anxiety disorder.[place]Minipress.We tried Paxilbutstopped due to frequent urination and Prozac which causes interaction with Topamax.Noh/osuicidal attempt,inpatient  treatment,psychosis, mania, hallucination or self abusive behavior.     Psychiatric Specialty Exam: Physical Exam  Review of Systems  Weight (!) 328 lb 7.8 oz (149 kg).There is no height or weight on file to calculate BMI.  General Appearance: NA  Eye Contact:  NA  Speech:  Normal Rate  Volume:  Decreased  Mood:  Anxious  Affect:  NA  Thought Process:  Goal Directed  Orientation:  Full (Time, Place, and Person)  Thought Content:  Logical  Suicidal Thoughts:  No  Homicidal Thoughts:  No  Memory:  Immediate;   Good Recent;   Good Remote;   Fair  Judgement:  Intact  Insight:  Present  Psychomotor Activity:  NA  Concentration:  Concentration: Fair and Attention Span: Fair  Recall:  Good  Fund of Knowledge:  Good  Language:  Good  Akathisia:  No  Handed:  Right  AIMS (if indicated):     Assets:  Communication Skills Desire for Improvement Housing Resilience Social Support  ADL's:  Intact  Cognition:  WNL  Sleep:   10 hrs      Assessment and Plan: PTSD.  Major depressive disorder, recurrent.  I review her current medication.  Patient never mentioned before about hallucinations and now she is reporting some time seeing spider.  I explained that only change she had is increased Tegretol and she might want consider calling her neurologist if it is a side effects of Tegretol.  We also recommend that she can cut down her Vistaril and only take twice a day rather than 3 times a day as she is sleeping too much.  She  agreed with the plan and she also plan to call her neurologist.  She is taking Minipress, Pristiq,.  She will talk to her nurse practitioner Jane Edwards to schedule appointment with therapist which was recommended on the last visit.  Discussed medication side effects and benefits.  Recommended to call us back if she has any question or any concern.  We will continue Pristiq 100 mg daily and Minipress 1 mg at bedtime.  Follow-up in 3 months.  Follow Up  Instructions:    I discussed the assessment and treatment plan with the patient. The patient was provided an opportunity to ask questions and all were answered. The patient agreed with the plan and demonstrated an understanding of the instructions.   The patient was advised to call back or seek an in-person evaluation if the symptoms worsen or if the condition fails to improve as anticipated.  I provided 18 minutes of non-face-to-face time during this encounter.   Kathlee Nations, MD

## 2020-09-28 ENCOUNTER — Other Ambulatory Visit: Payer: Self-pay | Admitting: Neurology

## 2020-10-02 ENCOUNTER — Other Ambulatory Visit: Payer: Self-pay

## 2020-10-02 ENCOUNTER — Inpatient Hospital Stay: Payer: Medicare Other | Attending: Oncology

## 2020-10-02 ENCOUNTER — Telehealth: Payer: Self-pay | Admitting: Oncology

## 2020-10-02 VITALS — BP 126/63 | HR 67 | Temp 98.7°F | Resp 18 | Ht 61.0 in | Wt 328.0 lb

## 2020-10-02 DIAGNOSIS — D500 Iron deficiency anemia secondary to blood loss (chronic): Secondary | ICD-10-CM | POA: Diagnosis not present

## 2020-10-02 DIAGNOSIS — N920 Excessive and frequent menstruation with regular cycle: Secondary | ICD-10-CM | POA: Diagnosis not present

## 2020-10-02 MED ORDER — SODIUM CHLORIDE 0.9% FLUSH
10.0000 mL | Freq: Once | INTRAVENOUS | Status: AC | PRN
  Administered 2020-10-02: 10 mL
  Filled 2020-10-02: qty 10

## 2020-10-02 MED ORDER — SODIUM CHLORIDE 0.9 % IV SOLN
Freq: Once | INTRAVENOUS | Status: AC
  Filled 2020-10-02: qty 250

## 2020-10-02 MED ORDER — SODIUM CHLORIDE 0.9 % IV SOLN
510.0000 mg | Freq: Once | INTRAVENOUS | Status: AC
  Administered 2020-10-02: 510 mg via INTRAVENOUS
  Filled 2020-10-02: qty 510

## 2020-10-02 MED ORDER — HEPARIN SOD (PORK) LOCK FLUSH 100 UNIT/ML IV SOLN
500.0000 [IU] | Freq: Once | INTRAVENOUS | Status: AC | PRN
  Administered 2020-10-02: 500 [IU]
  Filled 2020-10-02: qty 5

## 2020-10-02 NOTE — Patient Instructions (Signed)
[facility] Discharge Instructions for Patients Receiving [facility]  Today you received the following; [facility]     If you develop nausea and vomiting that is not controlled by your nausea medication, call the clinic.   BELOW ARE SYMPTOMS THAT SHOULD BE REPORTED IMMEDIATELY:  *FEVER GREATER THAN 100.5 F  *CHILLS WITH OR WITHOUT FEVER  NAUSEA AND VOMITING THAT IS NOT CONTROLLED WITH YOUR NAUSEA MEDICATION  *UNUSUAL SHORTNESS OF BREATH  *UNUSUAL BRUISING OR BLEEDING  TENDERNESS IN MOUTH AND THROAT WITH OR WITHOUT PRESENCE OF ULCERS  *URINARY PROBLEMS  *BOWEL PROBLEMS  UNUSUAL RASH Items with * indicate a potential emergency and should be followed up as soon as possible.  Feel free to call the clinic should you have any questions or concerns at The clinic phone number is [phone].   check-in to the Emergency Department and triage nurse.

## 2020-10-02 NOTE — Progress Notes (Signed)
PT IS STABLE AT DISCHARGE. 

## 2020-10-02 NOTE — Telephone Encounter (Signed)
Scheduled appt per treatment plan. Gave pt an appt calendar with her next appt. Pt confirmed that appt date and time would work for her.

## 2020-10-09 ENCOUNTER — Other Ambulatory Visit: Payer: Self-pay

## 2020-10-09 ENCOUNTER — Inpatient Hospital Stay: Payer: Medicare Other

## 2020-10-09 ENCOUNTER — Telehealth: Payer: Self-pay | Admitting: Oncology

## 2020-10-09 VITALS — BP 121/71 | HR 71 | Temp 98.6°F | Resp 18 | Ht 61.0 in | Wt 360.5 lb

## 2020-10-09 DIAGNOSIS — D500 Iron deficiency anemia secondary to blood loss (chronic): Secondary | ICD-10-CM

## 2020-10-09 MED ORDER — HEPARIN SOD (PORK) LOCK FLUSH 100 UNIT/ML IV SOLN
500.0000 [IU] | Freq: Once | INTRAVENOUS | Status: AC | PRN
  Administered 2020-10-09: 500 [IU]
  Filled 2020-10-09: qty 5

## 2020-10-09 MED ORDER — SODIUM CHLORIDE 0.9% FLUSH
10.0000 mL | Freq: Once | INTRAVENOUS | Status: AC | PRN
  Administered 2020-10-09: 10 mL
  Filled 2020-10-09: qty 10

## 2020-10-09 MED ORDER — SODIUM CHLORIDE 0.9 % IV SOLN
Freq: Once | INTRAVENOUS | Status: AC
  Filled 2020-10-09: qty 250

## 2020-10-09 MED ORDER — SODIUM CHLORIDE 0.9 % IV SOLN
510.0000 mg | Freq: Once | INTRAVENOUS | Status: AC
  Administered 2020-10-09: 510 mg via INTRAVENOUS
  Filled 2020-10-09: qty 17

## 2020-10-09 NOTE — Patient Instructions (Signed)

## 2020-10-09 NOTE — Progress Notes (Signed)
Pt is stable at discharge. 

## 2020-10-09 NOTE — Telephone Encounter (Signed)
Scheduled appts per appt conversions from [facility]. Gave pt a print out of AVS.

## 2020-10-23 ENCOUNTER — Telehealth: Payer: Self-pay | Admitting: Neurology

## 2020-10-23 ENCOUNTER — Other Ambulatory Visit: Payer: Self-pay | Admitting: Neurology

## 2020-10-30 DIAGNOSIS — R569 Unspecified convulsions: Secondary | ICD-10-CM | POA: Diagnosis not present

## 2020-10-30 DIAGNOSIS — M545 Low back pain, unspecified: Secondary | ICD-10-CM | POA: Diagnosis not present

## 2020-10-30 DIAGNOSIS — G4733 Obstructive sleep apnea (adult) (pediatric): Secondary | ICD-10-CM | POA: Diagnosis not present

## 2020-10-30 DIAGNOSIS — R2689 Other abnormalities of gait and mobility: Secondary | ICD-10-CM | POA: Diagnosis not present

## 2020-11-10 DIAGNOSIS — Z0001 Encounter for general adult medical examination with abnormal findings: Secondary | ICD-10-CM | POA: Diagnosis not present

## 2020-11-10 DIAGNOSIS — R2231 Localized swelling, mass and lump, right upper limb: Secondary | ICD-10-CM | POA: Diagnosis not present

## 2020-11-10 DIAGNOSIS — Z0100 Encounter for examination of eyes and vision without abnormal findings: Secondary | ICD-10-CM | POA: Diagnosis not present

## 2020-11-10 DIAGNOSIS — M79601 Pain in right arm: Secondary | ICD-10-CM | POA: Diagnosis not present

## 2020-11-12 ENCOUNTER — Other Ambulatory Visit (HOSPITAL_COMMUNITY): Payer: Self-pay | Admitting: Psychiatry

## 2020-11-12 DIAGNOSIS — F4310 Post-traumatic stress disorder, unspecified: Secondary | ICD-10-CM

## 2020-11-12 DIAGNOSIS — F330 Major depressive disorder, recurrent, mild: Secondary | ICD-10-CM

## 2020-11-18 DIAGNOSIS — R2231 Localized swelling, mass and lump, right upper limb: Secondary | ICD-10-CM | POA: Diagnosis not present

## 2020-11-18 DIAGNOSIS — R2243 Localized swelling, mass and lump, lower limb, bilateral: Secondary | ICD-10-CM | POA: Diagnosis not present

## 2020-11-18 DIAGNOSIS — R600 Localized edema: Secondary | ICD-10-CM | POA: Diagnosis not present

## 2020-11-20 ENCOUNTER — Other Ambulatory Visit: Payer: Self-pay | Admitting: Hematology and Oncology

## 2020-11-20 DIAGNOSIS — D500 Iron deficiency anemia secondary to blood loss (chronic): Secondary | ICD-10-CM

## 2020-12-02 NOTE — Progress Notes (Signed)
PT STABLE AT TIME OF DISCHARGE 

## 2020-12-04 ENCOUNTER — Other Ambulatory Visit: Payer: Self-pay | Admitting: Pharmacist

## 2020-12-07 ENCOUNTER — Other Ambulatory Visit: Payer: Self-pay | Admitting: Oncology

## 2020-12-07 DIAGNOSIS — D500 Iron deficiency anemia secondary to blood loss (chronic): Secondary | ICD-10-CM

## 2020-12-07 NOTE — Progress Notes (Signed)
[facility]  [address] [place],  [place]  [phone]  Clinic Day:  12/08/2020  Referring physician: Imagene Riches, NP   HISTORY OF PRESENT ILLNESS:  The patient is a 44 y.o. female with iron deficiency anemia secondary to heavy menstrual cycles.  In the past, she has received multiple courses of IV Feraheme to offset the severity of her iron deficiency from her menstrual cycles.  Her last course of IV Feraheme was given in October 2021.  She comes in today to reassess her iron and hemoglobin levels.    Since her last visit, the patient has been doing okay.  Her menstrual cycles are minimal as she is on Depo shots.  She denies having other overt forms of blood loss.     PHYSICAL EXAM:  Blood pressure 132/81, pulse 73, temperature (!) 97 F (36.1 C), resp. rate 16, height 5\' 3"  (1.6 m), SpO2 97 %. Wt Readings from Last 3 Encounters:  05/07/20 (!) 346 lb (156.9 kg)  10/13/20 (!) 359 lb 9 oz (163.1 kg)  10/09/20 (!) 360 lb 8 oz (163.5 kg)   Body mass index is 63.69 kg/m. Performance status (ECOG): 2 Physical Exam Constitutional:      Appearance: Normal appearance. She is not ill-appearing.  HENT:     Mouth/Throat:     Mouth: Mucous membranes are moist.     Pharynx: Oropharynx is clear. No oropharyngeal exudate or posterior oropharyngeal erythema.  Cardiovascular:     Rate and Rhythm: Normal rate and regular rhythm.     Heart sounds: No murmur heard. No friction rub. No gallop.   Pulmonary:     Effort: Pulmonary effort is normal. No respiratory distress.     Breath sounds: Normal breath sounds. No wheezing, rhonchi or rales.  Chest:  Breasts:     Right: No axillary adenopathy or supraclavicular adenopathy.     Left: No axillary adenopathy or supraclavicular adenopathy.    Abdominal:     General: Bowel sounds are normal. There is no distension.     Palpations: Abdomen is soft. There is no mass.     Tenderness: There is no  abdominal tenderness.  Musculoskeletal:        General: No swelling.     Right lower leg: No edema.     Left lower leg: No edema.  Lymphadenopathy:     Cervical: No cervical adenopathy.     Upper Body:     Right upper body: No supraclavicular or axillary adenopathy.     Left upper body: No supraclavicular or axillary adenopathy.     Lower Body: No right inguinal adenopathy. No left inguinal adenopathy.  Skin:    General: Skin is warm.     Coloration: Skin is not jaundiced.     Findings: No lesion or rash.  Neurological:     General: No focal deficit present.     Mental Status: She is alert and oriented to person, place, and time. Mental status is at baseline.     Cranial Nerves: Cranial nerves are intact.  Psychiatric:        Mood and Affect: Mood normal.        Behavior: Behavior normal.        Thought Content: Thought content normal.    LABS:   CBC Latest Ref Rng & Units 12/08/2020 07/09/2020 05/24/2019  WBC - 6.4 7.6 8.9  Hemoglobin 12.0 - 16.0 14.3 12.6 12.7  Hematocrit 36 - 46 44  41.1 39.6  Platelets 150 - 399 259 288 302   CMP Latest Ref Rng & Units 07/09/2020 05/24/2019 11/13/2018  Glucose 65 - 99 mg/dL 101(H) 101(H) 88  BUN 6 - 24 mg/dL 8 13 6   Creatinine 0.57 - 1.00 mg/dL 0.90 1.02(H) 0.81  Sodium 134 - 144 mmol/L 141 140 146(H)  Potassium 3.5 - 5.2 mmol/L 4.7 3.3(L) 4.5  Chloride 96 - 106 mmol/L 110(H) 100 111(H)  CO2 20 - 29 mmol/L 17(L) 22 16(L)  Calcium 8.7 - 10.2 mg/dL 9.2 9.7 9.0  Total Protein 6.0 - 8.5 g/dL 6.9 7.3 6.6  Total Bilirubin 0.0 - 1.2 mg/dL 0.3 0.3 0.6  Alkaline Phos 48 - 121 IU/L 103 99 81  AST 0 - 40 IU/L 13 15 17   ALT 0 - 32 IU/L 11 9 8      ASSESSMENT & PLAN:   Assessment/Plan:  A 44 y.o. female with iro deficiency anemia.  When evaluating her labs today, her hemoglobin is clearly better from her IV Feraheme.  At her last visit, the plan was for her to take [employer]andem iron tablets daily, but she never go around to doing this.  She will start  doing it now to maintain her iron stores.  Otherwise, I will see her back in 4 months for repeat clinical assessment.  The patient understands all the plans discussed today and is in agreement with them.      Obie Silos Macarthur Critchley, MD

## 2020-12-08 ENCOUNTER — Ambulatory Visit: Payer: Self-pay | Admitting: Oncology

## 2020-12-08 ENCOUNTER — Inpatient Hospital Stay: Payer: Medicare Other | Attending: Oncology

## 2020-12-08 ENCOUNTER — Other Ambulatory Visit: Payer: Self-pay | Admitting: Oncology

## 2020-12-08 ENCOUNTER — Other Ambulatory Visit: Payer: Self-pay | Admitting: Hematology and Oncology

## 2020-12-08 ENCOUNTER — Other Ambulatory Visit: Payer: Self-pay

## 2020-12-08 ENCOUNTER — Telehealth: Payer: Self-pay | Admitting: Oncology

## 2020-12-08 ENCOUNTER — Other Ambulatory Visit: Payer: Medicare Other

## 2020-12-08 ENCOUNTER — Inpatient Hospital Stay (INDEPENDENT_AMBULATORY_CARE_PROVIDER_SITE_OTHER): Payer: Medicare Other | Admitting: Oncology

## 2020-12-08 VITALS — BP 132/81 | HR 73 | Temp 97.0°F | Resp 16 | Ht 63.0 in

## 2020-12-08 DIAGNOSIS — D500 Iron deficiency anemia secondary to blood loss (chronic): Secondary | ICD-10-CM

## 2020-12-08 DIAGNOSIS — Z0001 Encounter for general adult medical examination with abnormal findings: Secondary | ICD-10-CM | POA: Diagnosis not present

## 2020-12-08 DIAGNOSIS — D649 Anemia, unspecified: Secondary | ICD-10-CM | POA: Diagnosis not present

## 2020-12-08 LAB — CBC: RBC: 5.67 — AB (ref 3.87–5.11)

## 2020-12-08 LAB — CBC AND DIFFERENTIAL
HCT: 44 (ref 36–46)
Hemoglobin: 14.3 (ref 12.0–16.0)
Neutrophils Absolute: 4.61
Platelets: 259 (ref 150–399)
WBC: 6.4

## 2020-12-08 LAB — IRON,TIBC AND FERRITIN PANEL
%SAT: 15.6
Iron: 56
TIBC: 358

## 2020-12-08 MED ORDER — FERROUS FUM-IRON POLYSACCH-FA 162-115.2-1 MG PO CAPS: 1.0000 | ORAL_CAPSULE | Freq: Every day | ORAL | 5 refills | Status: AC

## 2020-12-08 NOTE — Telephone Encounter (Signed)
Per 12/20 los next appt given to patient 

## 2020-12-09 ENCOUNTER — Telehealth: Payer: Self-pay | Admitting: Oncology

## 2020-12-09 NOTE — Telephone Encounter (Signed)
12/21 sched note-VM left for patient to cancel iron appt.

## 2020-12-11 ENCOUNTER — Inpatient Hospital Stay: Payer: Medicare Other

## 2020-12-11 ENCOUNTER — Ambulatory Visit: Payer: Self-pay

## 2020-12-16 DIAGNOSIS — R569 Unspecified convulsions: Secondary | ICD-10-CM | POA: Diagnosis not present

## 2020-12-16 DIAGNOSIS — R2689 Other abnormalities of gait and mobility: Secondary | ICD-10-CM | POA: Diagnosis not present

## 2020-12-16 DIAGNOSIS — M545 Low back pain, unspecified: Secondary | ICD-10-CM | POA: Diagnosis not present

## 2020-12-22 ENCOUNTER — Other Ambulatory Visit (HOSPITAL_COMMUNITY): Payer: Self-pay | Admitting: Psychiatry

## 2020-12-22 DIAGNOSIS — F4310 Post-traumatic stress disorder, unspecified: Secondary | ICD-10-CM

## 2020-12-23 ENCOUNTER — Other Ambulatory Visit: Payer: Self-pay

## 2020-12-23 ENCOUNTER — Telehealth (HOSPITAL_COMMUNITY): Payer: Medicare Other | Admitting: Psychiatry

## 2020-12-26 ENCOUNTER — Telehealth (INDEPENDENT_AMBULATORY_CARE_PROVIDER_SITE_OTHER): Payer: Medicare Other | Admitting: Psychiatry

## 2020-12-26 ENCOUNTER — Other Ambulatory Visit: Payer: Self-pay

## 2020-12-26 ENCOUNTER — Encounter (HOSPITAL_COMMUNITY): Payer: Self-pay | Admitting: Psychiatry

## 2020-12-26 ENCOUNTER — Other Ambulatory Visit (HOSPITAL_COMMUNITY): Payer: Self-pay | Admitting: Psychiatry

## 2020-12-26 DIAGNOSIS — F330 Major depressive disorder, recurrent, mild: Secondary | ICD-10-CM | POA: Diagnosis not present

## 2020-12-26 DIAGNOSIS — F4310 Post-traumatic stress disorder, unspecified: Secondary | ICD-10-CM | POA: Diagnosis not present

## 2020-12-26 DIAGNOSIS — K029 Dental caries, unspecified: Secondary | ICD-10-CM | POA: Diagnosis not present

## 2020-12-26 DIAGNOSIS — T50B95A Adverse effect of other viral vaccines, initial encounter: Secondary | ICD-10-CM | POA: Diagnosis not present

## 2020-12-26 DIAGNOSIS — R221 Localized swelling, mass and lump, neck: Secondary | ICD-10-CM | POA: Diagnosis not present

## 2020-12-26 DIAGNOSIS — K047 Periapical abscess without sinus: Secondary | ICD-10-CM | POA: Diagnosis not present

## 2020-12-26 MED ORDER — PRAZOSIN HCL 1 MG PO CAPS: 1.0000 mg | ORAL_CAPSULE | Freq: Every day | ORAL | 2 refills | Status: DC

## 2020-12-26 MED ORDER — DESVENLAFAXINE ER 100 MG PO TB24: 100.0000 mg | ORAL_TABLET | Freq: Every day | ORAL | 2 refills | Status: DC

## 2020-12-26 NOTE — Progress Notes (Signed)
Virtual Visit via Telephone Note  I connected with Jane Edwards on 12/26/20 at  9:20 AM EST by telephone and verified that I am speaking with the correct person using two identifiers.  Location: Patient: Home Provider: Home Office   I discussed the limitations, risks, security and privacy concerns of performing an evaluation and management service by telephone and the availability of in person appointments. I also discussed with the patient that there may be a patient responsible charge related to this service. The patient expressed understanding and agreed to proceed.   History of Present Illness: Patient is evaluated by phone surgeon.  She is on the phone by herself.  She was pleased because the Christmas went well.  Her brother, sister, niece and nephew came to visit her and usually she stays in her room but this time she was able to come out and talk to them.  She feels very good about it.  She feels the current medicine is working.  She still have occasionally hallucinations which she describes seeing spiders but now she calm herself and does not get bothered.  She had a good support from her mother who does help her when she needed groceries.  She is still reluctant to go outside but she is making small steps.  We have recommended therapy and patient is looking for a therapist who accepts her insurance.  Patient is scheduled to have blood work this month for Tegretol level.  She is still have facial swelling and working closely defined etiology but so far she tried different diet and use different detergents but no improvement.  She reported her swelling is episodic.  Patient does not want to change the medication because she feels the Pristiq and Minipress working very well.  She is sleeping good.  She denies any crying spells or any feeling of hopelessness.  Her nightmares and flashbacks are less intense.  She is hoping to resume therapy once insurance provides the name of therapist who is  insurance network.  Patient has no tremors, shakes or any EPS.    Past Psychiatric History:Reviewed. H/OPTSD and anxiety disorder.[place] andgivenMinipress.We tried Paxilbutstopped due to frequent urination and Prozac which causes interaction with Topamax.Noh/osuicidal attempt,inpatient treatment,psychosis, mania, hallucination or self abusive behavior.  Recent Results (from the past 2160 hour(s))  Iron, TIBC and Ferritin Panel     Status: None   Collection Time: 12/08/20 12:00 AM  Result Value Ref Range   Iron 56.0    TIBC 358    %SAT 15.6   CBC and differential     Status: None   Collection Time: 12/08/20 12:00 AM  Result Value Ref Range   Hemoglobin 14.3 12.0 - 16.0   HCT 44 36 - 46   Neutrophils Absolute 4.61    Platelets 259 150 - 399   WBC 6.4   CBC     Status: Abnormal   Collection Time: 12/08/20 12:00 AM  Result Value Ref Range   RBC 5.67 (A) 3.87 - 5.11     Psychiatric Specialty Exam: Physical Exam  Review of Systems  Weight (!) 359 lb (162.8 kg).There is no height or weight on file to calculate BMI.  General Appearance: NA  Eye Contact:  NA  Speech:  Slow  Volume:  Decreased  Mood:  Anxious  Affect:  NA  Thought Process:  Descriptions of Associations: Intact  Orientation:  Full (Time, Place, and Person)  Thought Content:  Hallucinations: Visual seeing spider sometimes  Suicidal  Thoughts:  No  Homicidal Thoughts:  No  Memory:  Immediate;   Good Recent;   Good Remote;   Fair  Judgement:  Intact  Insight:  Present  Psychomotor Activity:  NA  Concentration:  Concentration: Fair and Attention Span: Fair  Recall:  [employer] of Knowledge:  Good  Language:  Good  Akathisia:  No  Handed:  Right  AIMS (if indicated):     Assets:  Communication Skills Desire for Improvement Housing Resilience Social Support  ADL's:  Intact  Cognition:  WNL  Sleep:   ok      Assessment and Plan: PTSD.  Major depressive  disorder, recurrent.    Patient doing better and she had a good Christmas.  She does not want to change the medication.  Continue Pristiq 100 mg daily and Minipress 1 mg at bedtime.  Patient is working closely with her PCP and her insurance company to find a therapist who can accept her insurance.  I encourage that therapy will help her mood.  She is getting Tegretol from her neurologist.  Discussed medication side effects and benefits.  Recommended to call us back with any question or any concern.  Follow-up in 3 months.  Follow Up Instructions:    I discussed the assessment and treatment plan with the patient. The patient was provided an opportunity to ask questions and all were answered. The patient agreed with the plan and demonstrated an understanding of the instructions.   The patient was advised to call back or seek an in-person evaluation if the symptoms worsen or if the condition fails to improve as anticipated.  I provided 16 minutes of non-face-to-face time during this encounter.   Kathlee Nations, MD

## 2020-12-29 ENCOUNTER — Other Ambulatory Visit (HOSPITAL_COMMUNITY): Payer: Self-pay | Admitting: Psychiatry

## 2020-12-29 DIAGNOSIS — F330 Major depressive disorder, recurrent, mild: Secondary | ICD-10-CM

## 2020-12-29 DIAGNOSIS — F4310 Post-traumatic stress disorder, unspecified: Secondary | ICD-10-CM

## 2021-01-01 ENCOUNTER — Other Ambulatory Visit (HOSPITAL_COMMUNITY): Payer: Self-pay | Admitting: Psychiatry

## 2021-01-01 DIAGNOSIS — Z09 Encounter for follow-up examination after completed treatment for conditions other than malignant neoplasm: Secondary | ICD-10-CM | POA: Diagnosis not present

## 2021-01-01 DIAGNOSIS — F4310 Post-traumatic stress disorder, unspecified: Secondary | ICD-10-CM

## 2021-01-01 DIAGNOSIS — F330 Major depressive disorder, recurrent, mild: Secondary | ICD-10-CM

## 2021-01-01 DIAGNOSIS — T7840XS Allergy, unspecified, sequela: Secondary | ICD-10-CM | POA: Diagnosis not present

## 2021-01-06 ENCOUNTER — Other Ambulatory Visit (HOSPITAL_COMMUNITY): Payer: Self-pay | Admitting: Psychiatry

## 2021-01-06 DIAGNOSIS — F330 Major depressive disorder, recurrent, mild: Secondary | ICD-10-CM

## 2021-01-06 DIAGNOSIS — F4310 Post-traumatic stress disorder, unspecified: Secondary | ICD-10-CM

## 2021-01-14 ENCOUNTER — Encounter: Payer: Self-pay | Admitting: Neurology

## 2021-01-14 ENCOUNTER — Ambulatory Visit (INDEPENDENT_AMBULATORY_CARE_PROVIDER_SITE_OTHER): Payer: Medicare Other | Admitting: Neurology

## 2021-01-14 VITALS — BP 136/77 | HR 86 | Ht 61.0 in

## 2021-01-14 DIAGNOSIS — E662 Morbid (severe) obesity with alveolar hypoventilation: Secondary | ICD-10-CM | POA: Diagnosis not present

## 2021-01-14 DIAGNOSIS — I10 Essential (primary) hypertension: Secondary | ICD-10-CM | POA: Diagnosis not present

## 2021-01-14 DIAGNOSIS — G40909 Epilepsy, unspecified, not intractable, without status epilepticus: Secondary | ICD-10-CM

## 2021-01-14 DIAGNOSIS — R000 Tachycardia, unspecified: Secondary | ICD-10-CM | POA: Diagnosis not present

## 2021-01-14 DIAGNOSIS — D649 Anemia, unspecified: Secondary | ICD-10-CM | POA: Diagnosis not present

## 2021-01-14 DIAGNOSIS — R5381 Other malaise: Secondary | ICD-10-CM | POA: Diagnosis not present

## 2021-01-14 DIAGNOSIS — T7840XA Allergy, unspecified, initial encounter: Secondary | ICD-10-CM | POA: Diagnosis not present

## 2021-01-14 DIAGNOSIS — Z6841 Body Mass Index (BMI) 40.0 and over, adult: Secondary | ICD-10-CM | POA: Diagnosis not present

## 2021-01-14 NOTE — Progress Notes (Signed)
I have read the note, and I agree with the clinical assessment and plan.  Lyrik Dockstader K Haile Bosler   

## 2021-01-14 NOTE — Patient Instructions (Signed)
Continue current medications Call for seizure activity  Continue to work with your primary doctor about the swelling, see a dentist See you back in 6 months
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
## 2021-01-15 ENCOUNTER — Telehealth: Payer: Self-pay

## 2021-01-15 LAB — COMPREHENSIVE METABOLIC PANEL
ALT: 18 IU/L (ref 0–32)
AST: 24 IU/L (ref 0–40)
Albumin/Globulin Ratio: 1.4 (ref 1.2–2.2)
Albumin: 4.1 g/dL (ref 3.8–4.8)
Alkaline Phosphatase: 108 IU/L (ref 44–121)
BUN/Creatinine Ratio: 9 (ref 9–23)
BUN: 8 mg/dL (ref 6–24)
Bilirubin Total: 0.3 mg/dL (ref 0.0–1.2)
CO2: 18 mmol/L — ABNORMAL LOW (ref 20–29)
Calcium: 9.1 mg/dL (ref 8.7–10.2)
Chloride: 109 mmol/L — ABNORMAL HIGH (ref 96–106)
Creatinine, Ser: 0.86 mg/dL (ref 0.57–1.00)
GFR calc Af Amer: 96 mL/min/{1.73_m2} (ref >59)
GFR calc non Af Amer: 83 mL/min/{1.73_m2} (ref >59)
Globulin, Total: 2.9 g/dL (ref 1.5–4.5)
Glucose: 123 mg/dL — ABNORMAL HIGH (ref 65–99)
Potassium: 4.4 mmol/L (ref 3.5–5.2)
Sodium: 140 mmol/L (ref 134–144)
Total Protein: 7 g/dL (ref 6.0–8.5)

## 2021-01-15 LAB — CBC WITH DIFFERENTIAL/PLATELET
Basophils Absolute: 0 10*3/uL (ref 0.0–0.2)
Basos: 0 %
EOS (ABSOLUTE): 0.1 10*3/uL (ref 0.0–0.4)
Eos: 1 %
Hematocrit: 40 % (ref 34.0–46.6)
Hemoglobin: 12.8 g/dL (ref 11.1–15.9)
Immature Grans (Abs): 0 10*3/uL (ref 0.0–0.1)
Immature Granulocytes: 0 %
Lymphocytes Absolute: 1.1 10*3/uL (ref 0.7–3.1)
Lymphs: 15 %
MCH: 24.3 pg — ABNORMAL LOW (ref 26.6–33.0)
MCHC: 32 g/dL (ref 31.5–35.7)
MCV: 76 fL — ABNORMAL LOW (ref 79–97)
Monocytes Absolute: 0.5 10*3/uL (ref 0.1–0.9)
Monocytes: 6 %
Neutrophils Absolute: 5.9 10*3/uL (ref 1.4–7.0)
Neutrophils: 78 %
Platelets: 273 10*3/uL (ref 150–450)
RBC: 5.26 x10E6/uL (ref 3.77–5.28)
RDW: 13.7 % (ref 11.7–15.4)
WBC: 7.7 10*3/uL (ref 3.4–10.8)

## 2021-01-15 LAB — CARBAMAZEPINE LEVEL, TOTAL: Carbamazepine (Tegretol), S: 11 ug/mL (ref 4.0–12.0)

## 2021-01-15 NOTE — Telephone Encounter (Signed)
Spoke to mother, states pt does take medication as prescribed, she will contact  PCP for repeat blood work

## 2021-01-15 NOTE — Telephone Encounter (Signed)
-----   Message from Suzzanne Cloud, NP sent at 01/15/2021 12:47 PM EST ----- Please call the patient.  Labs showed no significant abnormalities, no elevated WBC count.  Only thing of note I am curious about, carbamazepine level was 11.0, which is in therapeutic range, however on the higher end.  Please verify she is taking medication as prescribed which is 300 mg twice a day.  I am surprised the level is this, she reports she did not take the medication on the day of the appointment.  It might be helpful to recheck this, in case we need to increase the dose in the future for breakthrough seizure.  She has a port, is difficult for her to commute in a car to our office.  Could this be collected at her primary doctor's office at her next visit? Thanks

## 2021-01-15 NOTE — Progress Notes (Signed)
Results given.

## 2021-01-16 DIAGNOSIS — R2689 Other abnormalities of gait and mobility: Secondary | ICD-10-CM | POA: Diagnosis not present

## 2021-01-16 DIAGNOSIS — R569 Unspecified convulsions: Secondary | ICD-10-CM | POA: Diagnosis not present

## 2021-01-16 DIAGNOSIS — M545 Low back pain, unspecified: Secondary | ICD-10-CM | POA: Diagnosis not present

## 2021-01-19 ENCOUNTER — Other Ambulatory Visit: Payer: Self-pay | Admitting: Neurology

## 2021-01-19 NOTE — Telephone Encounter (Signed)
Noted. Provider notified.

## 2021-01-19 NOTE — Telephone Encounter (Signed)
Pt returned call and is needing to speak to the RN. She states that she takes her medication as it should be taken and does not understand why the levels are high. Please call pt back at [phone] to discuss.

## 2021-01-29 ENCOUNTER — Other Ambulatory Visit: Payer: Self-pay | Admitting: Pharmacist

## 2021-01-29 DIAGNOSIS — G4733 Obstructive sleep apnea (adult) (pediatric): Secondary | ICD-10-CM | POA: Diagnosis not present

## 2021-01-30 ENCOUNTER — Other Ambulatory Visit: Payer: Self-pay

## 2021-01-30 ENCOUNTER — Inpatient Hospital Stay: Payer: Medicare Other | Attending: Oncology

## 2021-01-30 VITALS — BP 146/78 | HR 91 | Temp 98.9°F | Resp 20 | Ht 61.0 in | Wt 361.0 lb

## 2021-01-30 DIAGNOSIS — D509 Iron deficiency anemia, unspecified: Secondary | ICD-10-CM | POA: Insufficient documentation

## 2021-01-30 DIAGNOSIS — Z452 Encounter for adjustment and management of vascular access device: Secondary | ICD-10-CM | POA: Diagnosis not present

## 2021-01-30 DIAGNOSIS — D500 Iron deficiency anemia secondary to blood loss (chronic): Secondary | ICD-10-CM

## 2021-01-30 MED ORDER — HEPARIN SOD (PORK) LOCK FLUSH 100 UNIT/ML IV SOLN
500.0000 [IU] | Freq: Once | INTRAVENOUS | Status: AC | PRN
  Administered 2021-01-30: 500 [IU]
  Filled 2021-01-30: qty 5

## 2021-01-30 NOTE — Patient Instructions (Signed)
Heparin injection O que  este medicamento? A HEPARINA  um anticoagulante.  usada para tratar ou prevenir cogulos nas veias, artrias, pulmes ou corao. Impede que os cogulos se formem ou aumentem de tamanho. Este medicamento evita que o sangue coagule durante uma cirurgia cardaca aberta, durante uma dilise ou em pessoas acamadas. Este medicamento pode ser usado para outros propsitos; em caso de dvidas, pergunte ao seu profissional de sade ou farmacutico. NOMES DE MARCAS COMUNS: Hep-Lock, Hep-Lock U/P, Hepflush-10, Monoject Prefill Advanced Heparin Lock Flush, SASH Normal Saline and Heparin O que devo dizer a meu profissional de sade antes de tomar este medicamento? Precisam saber se voc tem algum dos seguintes problemas ou estados de sade: problemas de sangramento, como hemofilia ou contagem baixa de plaquetas doena intestinal ou diverticulite endocardite presso alta doenas hepticas cirurgia ou parto recente lcera estomacal reao estranha ou alergia  heparina, ao lcool benzlico ou aos sulfitos reao estranha ou alergia a outros medicamentos, alimentos, corantes ou conservantes est grvida ou tentando engravidar est amamentando Como devo usar este medicamento? Este medicamento deve ser injetado ou administrado por infuso intravenosa. Tambm pode ser administrado pela injeo subcutnea de pequenas quantidades. Este medicamento costuma ser administrado por um profissional da sade no hospital ou em consultrio. Se este medicamento for administrado em casa, voc ser ensinado a preparar e aplicar o medicamento. Use exatamente como indicado. Tome este medicamento em intervalos regulares. No tome este medicamento com frequncia maior do que a indicada. No pare de usar este medicamento a menos que seu mdico mande. Interromper o uso deste medicamento pode aumentar o risco de cogulos sanguneos. Certifique-se de renovar a sua receita antes de ficar sem o medicamento.   muito importante que as agulhas e seringas usadas sejam descartadas em um coletor especial para materiais perfurocortantes. No as coloque na lata de lixo. Se no tiver um coletor para materiais perfurocortantes, pea um a seu farmacutico ou profissional de sade. Fale com seu pediatra a respeito do uso deste medicamento em crianas. Embora este medicamento j tenha sido usado em crianas pequenas para certas condies, algumas precaues so necessrias. Superdosagem: Se achar que tomou uma superdosagem deste medicamento, entre em contato imediatamente com o [facility] ou v a um pronto-socorro. OBSERVAO: Este medicamento  s para voc. No compartilhe este medicamento com outras pessoas. E se eu deixar de tomar uma dose? Se perder uma dose, tome-a assim que possvel. Se j estiver quase na hora da sua prxima dose, tome somente essa dose. No tome o remdio em dobro, nem tome uma dose adicional. O que pode interagir com este medicamento? No tome este medicamento com nenhum dos seguintes: aspirina e medicamentos similares mifepristona alguns medicamentos que tratam ou previnem a trombose, como varfarina, enoxaparina e dalteparina palifermina protamina Este medicamento tambm pode interagir com os seguintes remdios: dextrana digoxina hidroxicloroquina medicamentos para resfriado ou alergia nicotina AINEs, medicamentos analgsicos e anti-inflamatrios, como ibuprofeno, naproxeno fenilbutazona tetraciclinas Esta lista pode no descrever todas as interaes possveis. D ao seu profissional de sade uma lista de todos os medicamentos, ervas medicinais, remdios de venda livre, ou suplementos alimentares que voc usa. Diga tambm se voc fuma, bebe, ou usa drogas ilcitas. Alguns destes podem interagir com o seu medicamento. Ao que devo ficar atento quando estiver usando este medicamento? Consulte seu mdico ou profissional de sade para realizar um acompanhamento  regular da sua evoluo. Voc precisar fazer exames de sangue peridicos enquanto estiver tomando este medicamento. Voc ser monitorado(a) atentamente enquanto estiver tomando este   medicamento.  importante que voc no perca nenhuma consulta. Use uma pulseira ou corrente de identificao mdica. Leve sempre consigo uma carteirinha de identificao com os detalhes do seu problema de sade e dos medicamentos que voc toma e com o nome do seu mdico. Se as suas mos ou ps ficarem frios e azulados, avise seu mdico ou profissional de sade imediatamente. Se vai precisar passar por uma cirurgia ou outro procedimento, informe a seu mdico ou profissional de sade que est usando este medicamento. Enquanto estiver usando este medicamento, evite esportes e atividades nas quais possa se machucar. Quedas fortes ou ferimentos podem provocar hemorragias invisveis. Tome cuidado ao usar ferramentas pontiagudas ou facas. Pense em usar um barbeador eltrico. Tome muito cuidado ao escovar os dentes e usar fio dental. Caso ocorra qualquer ferimento, hematoma ou manchas vermelhas na pele, avise seu mdico ou profissional de sade. Usar este medicamento por muito tempo pode causar enfraquecimento sseo e aumentar o risco de fraturas. Voc deve consumir clcio e vitamina D em quantidade suficiente enquanto estiver tomando este medicamento. Converse com seu mdico ou profissional de sade a respeito dos alimentos que consome e das vitaminas que toma. Use uma pulseira ou corrente de identificao mdica. Mantenha consigo um carto que descreve a sua doena, os detalhes deste medicamento e os horrios de dosagem. Que efeitos colaterais posso sentir aps usar este medicamento? Efeitos colaterais que devem ser informados ao seu mdico ou profissional de sade o mais rpido possvel: reaes alrgicas, como erupo na pele, coceira, urticria, ou inchao do rosto, dos lbios ou da lngua dor nos ossos febre ou  calafrios enjoo ou vmitos sinais ou sintomas de hemorragia, como fezes sanguinolentas ou negras com aspecto de piche; urina vermelha ou marrom escuro; tosse ou vmitos com sangue ou com grumos marrons com aspecto de borra de caf; manchas vermelhas na pele; e hematomas ou sangramentos fora do comum nos olhos, gengivas ou nariz sinais e sintomas de um cogulo sanguneo, tais como dor no peito; falta de ar; dor, inchao ou calor na perna sinais e sintomas de derrame, como alteraes na viso; confuso; dificuldade para falar ou entender os outros; cefaleia grave; dormncia sbita ou fraqueza na face, no brao ou na perna; dificuldade para andar; tontura; perda de coordenao Efeitos colaterais que normalmente no precisam de cuidados mdicos (avise ao seu mdico ou profissional de sade se persistirem ou forem incmodos): queda de cabelo dor, vermelhido, coceira ou irritao no local da injeo Esta lista pode no descrever todos os efeitos colaterais possveis. Para mais orientaes sobre efeitos colaterais, consulte o seu mdico. Voc pode relatar a ocorrncia de efeitos colaterais  FDA pelo telefone [phone]. Onde devo guardar meu medicamento? Manter fora do alcance das crianas. Conservar frascos-ampola fechados em temperatura ambiente, entre 15 e 30 degreesC (59 e 86 degreesF). No congelar. No use este medicamento se a soluo estiver descolorida ou partculas estiverem presentes. Descartar qualquer medicamento no utilizado aps a data de validade impressa no rtulo ou embalagem. OBSERVAO: Este folheto  um resumo. Pode no cobrir todas as informaes possveis. Se tiver dvidas a respeito deste medicamento, fale com seu mdico, farmacutico ou profissional de sade.  2021 Elsevier/Gold Standard (2020-06-10 00:00:00)  

## 2021-02-03 DIAGNOSIS — I110 Hypertensive heart disease with heart failure: Secondary | ICD-10-CM | POA: Diagnosis not present

## 2021-02-03 DIAGNOSIS — Z79899 Other long term (current) drug therapy: Secondary | ICD-10-CM | POA: Diagnosis not present

## 2021-02-03 DIAGNOSIS — I509 Heart failure, unspecified: Secondary | ICD-10-CM | POA: Diagnosis not present

## 2021-02-03 DIAGNOSIS — I5030 Unspecified diastolic (congestive) heart failure: Secondary | ICD-10-CM | POA: Diagnosis not present

## 2021-02-03 DIAGNOSIS — E785 Hyperlipidemia, unspecified: Secondary | ICD-10-CM | POA: Diagnosis not present

## 2021-02-03 DIAGNOSIS — G4733 Obstructive sleep apnea (adult) (pediatric): Secondary | ICD-10-CM | POA: Diagnosis not present

## 2021-02-03 DIAGNOSIS — Z91040 Latex allergy status: Secondary | ICD-10-CM | POA: Diagnosis not present

## 2021-02-03 DIAGNOSIS — Z560 Unemployment, unspecified: Secondary | ICD-10-CM | POA: Diagnosis not present

## 2021-02-03 DIAGNOSIS — Z9989 Dependence on other enabling machines and devices: Secondary | ICD-10-CM | POA: Diagnosis not present

## 2021-02-04 DIAGNOSIS — Z885 Allergy status to narcotic agent status: Secondary | ICD-10-CM | POA: Diagnosis not present

## 2021-02-04 DIAGNOSIS — Z560 Unemployment, unspecified: Secondary | ICD-10-CM | POA: Diagnosis not present

## 2021-02-04 DIAGNOSIS — G4733 Obstructive sleep apnea (adult) (pediatric): Secondary | ICD-10-CM | POA: Diagnosis not present

## 2021-02-04 DIAGNOSIS — R7881 Bacteremia: Secondary | ICD-10-CM | POA: Diagnosis not present

## 2021-02-04 DIAGNOSIS — Z20822 Contact with and (suspected) exposure to covid-19: Secondary | ICD-10-CM | POA: Diagnosis not present

## 2021-02-04 DIAGNOSIS — Z79899 Other long term (current) drug therapy: Secondary | ICD-10-CM | POA: Diagnosis not present

## 2021-02-04 DIAGNOSIS — Z91040 Latex allergy status: Secondary | ICD-10-CM | POA: Diagnosis not present

## 2021-02-04 DIAGNOSIS — I10 Essential (primary) hypertension: Secondary | ICD-10-CM | POA: Diagnosis not present

## 2021-02-04 DIAGNOSIS — E785 Hyperlipidemia, unspecified: Secondary | ICD-10-CM | POA: Diagnosis not present

## 2021-02-04 DIAGNOSIS — G479 Sleep disorder, unspecified: Secondary | ICD-10-CM | POA: Diagnosis not present

## 2021-02-04 DIAGNOSIS — M7989 Other specified soft tissue disorders: Secondary | ICD-10-CM | POA: Diagnosis not present

## 2021-02-05 ENCOUNTER — Other Ambulatory Visit: Payer: Self-pay | Admitting: Neurology

## 2021-02-05 ENCOUNTER — Telehealth: Payer: Self-pay | Admitting: Neurology

## 2021-02-05 DIAGNOSIS — R600 Localized edema: Secondary | ICD-10-CM | POA: Diagnosis not present

## 2021-02-05 MED ORDER — TOPIRAMATE ER 150 MG PO SPRINKLE CAP24: 300.0000 mg | EXTENDED_RELEASE_CAPSULE | Freq: Every day | ORAL | 3 refills | Status: DC

## 2021-02-05 MED ORDER — CARBAMAZEPINE 200 MG PO TABS: 300.0000 mg | ORAL_TABLET | Freq: Two times a day (BID) | ORAL | 3 refills | Status: AC

## 2021-02-05 NOTE — Addendum Note (Signed)
Addended by: Kathrynn Ducking on: 02/05/2021 05:20 PM   Modules accepted: Orders

## 2021-02-05 NOTE — Telephone Encounter (Signed)
Patient's mother Ames Coupe called in with some concerns and would like a call back from Dr. Jannifer Franklin. Patient was recently seen at [facility] for a 30 minute seizure. They were at her PCP's office when she called and they have concerns about the seizure medication she's been on. Best call back number for Mardene Celeste is [phone].

## 2021-02-05 NOTE — Telephone Encounter (Signed)
I called the family.  The patient is possibly on extended release topiramate 150 mg, 2 a day.  She is supposed to be on 300 mg of carbamazepine twice a day.  I have sent in prescriptions for these medications.  The patient was in the hospital recently for prolonged seizure, she had missed a dose of medication.

## 2021-02-19 DIAGNOSIS — G4733 Obstructive sleep apnea (adult) (pediatric): Secondary | ICD-10-CM | POA: Diagnosis not present

## 2021-02-25 DIAGNOSIS — M545 Low back pain, unspecified: Secondary | ICD-10-CM | POA: Diagnosis not present

## 2021-02-25 DIAGNOSIS — R2689 Other abnormalities of gait and mobility: Secondary | ICD-10-CM | POA: Diagnosis not present

## 2021-02-25 DIAGNOSIS — R569 Unspecified convulsions: Secondary | ICD-10-CM | POA: Diagnosis not present

## 2021-03-10 ENCOUNTER — Other Ambulatory Visit: Payer: Self-pay

## 2021-03-10 ENCOUNTER — Encounter: Payer: Self-pay | Admitting: Allergy

## 2021-03-10 ENCOUNTER — Ambulatory Visit (INDEPENDENT_AMBULATORY_CARE_PROVIDER_SITE_OTHER): Payer: Medicare Other | Admitting: Allergy

## 2021-03-10 VITALS — BP 130/82 | HR 104 | Resp 18 | Ht 61.0 in | Wt 360.0 lb

## 2021-03-10 DIAGNOSIS — R220 Localized swelling, mass and lump, head: Secondary | ICD-10-CM

## 2021-03-10 DIAGNOSIS — T783XXD Angioneurotic edema, subsequent encounter: Secondary | ICD-10-CM
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
## 2021-03-10 NOTE — Patient Instructions (Addendum)
Angioedema (swelling) -At this time cause of angioedema is unknown.  Pictures were provided where she did have visible facial and periorbital edema.   -Discussed today that angioedema could be histaminergic in nature versus nonhistaminergic (bradykinin) in nature.  Histaminergic causes typically involve IgE and activation of allergy cells which could be driven by numerous factors including environmental allergens, food allergens, medications etc.  Bradykinin induced swelling can be hereditary angioedema. -We will obtain the following labs: Hereditary angioedema panel, tryptase level, environmental allergy panel -Discussed today do not believe the symptoms are related to food allergy -Discussed using antihistamine regimen including histamine 1 blockade and histamine 2 blockade with the following medicines: Zyrtec 10 mg with Pepcid 20 mg both daily.  If swelling continues to persist despite antihistamine daily use then increase both of these to twice a day dosing.

## 2021-03-16 ENCOUNTER — Other Ambulatory Visit (HOSPITAL_COMMUNITY): Payer: Self-pay | Admitting: Psychiatry

## 2021-03-16 DIAGNOSIS — F4310 Post-traumatic stress disorder, unspecified: Secondary | ICD-10-CM

## 2021-03-27 ENCOUNTER — Other Ambulatory Visit: Payer: Self-pay

## 2021-03-27 ENCOUNTER — Telehealth (INDEPENDENT_AMBULATORY_CARE_PROVIDER_SITE_OTHER): Payer: Medicare Other | Admitting: Psychiatry

## 2021-03-27 ENCOUNTER — Encounter (HOSPITAL_COMMUNITY): Payer: Self-pay | Admitting: Psychiatry

## 2021-03-27 VITALS — Wt 360.0 lb

## 2021-03-27 DIAGNOSIS — F330 Major depressive disorder, recurrent, mild: Secondary | ICD-10-CM

## 2021-03-27 DIAGNOSIS — F4310 Post-traumatic stress disorder, unspecified: Secondary | ICD-10-CM

## 2021-03-27 MED ORDER — TRAZODONE HCL 100 MG PO TABS: 50.0000 mg | ORAL_TABLET | Freq: Every day | ORAL | 2 refills | Status: DC

## 2021-03-27 MED ORDER — DESVENLAFAXINE ER 100 MG PO TB24: 100.0000 mg | ORAL_TABLET | Freq: Every day | ORAL | 2 refills | Status: DC

## 2021-03-27 NOTE — Progress Notes (Signed)
Virtual Visit via Telephone Note  I connected with Jane Edwards on 03/27/21 at 10:40 AM EDT by telephone and verified that I am speaking with the correct person using two identifiers.  Location: Patient: Home Provider: Home Office   I discussed the limitations, risks, security and privacy concerns of performing an evaluation and management service by telephone and the availability of in person appointments. I also discussed with the patient that there may be a patient responsible charge related to this service. The patient expressed understanding and agreed to proceed.   History of Present Illness: Patient is evaluated by phone session.  She had seen in the emergency room in [facility] because of prolonged seizures.  Patient told it was more than 20 minutes.  She is feeling better now.  She is still struggle with hallucination but they are chronic and now she is able to manage.  She recall seeing spiders and insects on occasions but denies any suicidal thoughts, paranoia or any agitation.  She remains in the house and stays to herself most of the time.  Despite recommendation she only goes outside when she has to see the doctors.  Recently she had a visit to allergist because of swelling getting worse.  Now swelling is all over the body and she was started on Zyrtec and Pepcid recently.  She is hoping that helps the swelling.  She endorsed lately noticed nightmares are not going away and Minipress is not working as good.  She feels her depression is a stable on Pristiq.  She denies any crying spells, anhedonia.  We have recommended therapy and patient was trying to work with the primary care physician to get a therapist appointment but now she is in the process of changing her PCP.  She has appointment with her new PCP Jane Edwards in few weeks.  In the past we have recommended if she has trouble getting therapy appointment we can help and I will she is open to get appointment from our office.   She endorsed nightmares and flashbacks.  She is taking multiple antihypertensive medication.  She denies any tremors shakes or any EPS.  She has appointment with neurologist and she like to discuss her seizure medicine if back causing any swelling or allergy reaction.  Past Psychiatric History:Reviewed. H/OPTSD and anxiety disorder.[place] andgivenMinipress.Paxil caused frequent urination and Prozac caused interaction with Topamax. Minipress stopped working. Noh/osuicidal attempt or inpatient treatment.     Recent Results (from the past 2160 hour(s))  CMP     Status: Abnormal   Collection Time: 01/14/21 11:50 AM  Result Value Ref Range   Glucose 123 (H) 65 - 99 mg/dL   BUN 8 6 - 24 mg/dL   Creatinine, Ser 0.86 0.57 - 1.00 mg/dL   GFR calc non Af Amer 83 >59 mL/min/1.73   GFR calc Af Amer 96 >59 mL/min/1.73    Comment: **In accordance with recommendations from the NKF-ASN Task force,**   [facility] is in the process of updating its eGFR calculation to the   2021 CKD-EPI creatinine equation that estimates kidney function   without a race variable.    BUN/Creatinine Ratio 9 9 - 23   Sodium 140 134 - 144 mmol/L   Potassium 4.4 3.5 - 5.2 mmol/L   Chloride 109 (H) 96 - 106 mmol/L   CO2 18 (L) 20 - 29 mmol/L   Calcium 9.1 8.7 - 10.2 mg/dL   Total Protein 7.0 6.0 - 8.5 g/dL  Albumin 4.1 3.8 - 4.8 g/dL   Globulin, Total 2.9 1.5 - 4.5 g/dL   Albumin/Globulin Ratio 1.4 1.2 - 2.2   Bilirubin Total 0.3 0.0 - 1.2 mg/dL   Alkaline Phosphatase 108 44 - 121 IU/L   AST 24 0 - 40 IU/L   ALT 18 0 - 32 IU/L  CBC with Differential/Platelet     Status: Abnormal   Collection Time: 01/14/21 11:50 AM  Result Value Ref Range   WBC 7.7 3.4 - 10.8 x10E3/uL   RBC 5.26 3.77 - 5.28 x10E6/uL   Hemoglobin 12.8 11.1 - 15.9 g/dL   Hematocrit 40.0 34.0 - 46.6 %   MCV 76 (L) 79 - 97 fL   MCH 24.3 (L) 26.6 - 33.0 pg   MCHC 32.0 31.5 - 35.7 g/dL   RDW 13.7 11.7 - 15.4 %    Platelets 273 150 - 450 x10E3/uL   Neutrophils 78 Not Estab. %   Lymphs 15 Not Estab. %   Monocytes 6 Not Estab. %   Eos 1 Not Estab. %   Basos 0 Not Estab. %   Neutrophils Absolute 5.9 1.4 - 7.0 x10E3/uL   Lymphocytes Absolute 1.1 0.7 - 3.1 x10E3/uL   Monocytes Absolute 0.5 0.1 - 0.9 x10E3/uL   EOS (ABSOLUTE) 0.1 0.0 - 0.4 x10E3/uL   Basophils Absolute 0.0 0.0 - 0.2 x10E3/uL   Immature Granulocytes 0 Not Estab. %   Immature Grans (Abs) 0.0 0.0 - 0.1 x10E3/uL  Carbamazepine level, total     Status: None   Collection Time: 01/14/21 11:50 AM  Result Value Ref Range   Carbamazepine (Tegretol), S 11.0 4.0 - 12.0 ug/mL    Comment:          In conjunction with other antiepileptic drugs                                Therapeutic  4.0 -  8.0                                Toxicity     9.0 - 12.0                                    Carbamazepine alone                                Therapeutic  8.0 - 12.0                                 Detection Limit =  2.0                           <2.0 indicated None Detected      Psychiatric Specialty Exam: Physical Exam  Review of Systems  Weight (!) 360 lb (163.3 kg).There is no height or weight on file to calculate BMI.  General Appearance: NA  Eye Contact:  NA  Speech:  Slow  Volume:  Decreased  Mood:  Anxious  Affect:  NA  Thought Process:  Descriptions of Associations: Intact  Orientation:  Full (Time, Place, and Person)  Thought Content:  Hallucinations: Visual seeing spiders some  times  Suicidal Thoughts:  No  Homicidal Thoughts:  No  Memory:  Immediate;   Fair Recent;   Fair Remote;   Fair  Judgement:  Fair  Insight:  Shallow  Psychomotor Activity:  NA  Concentration:  Concentration: Fair and Attention Span: Fair  Recall:  [employer] of Knowledge:  Good  Language:  Good  Akathisia:  No  Handed:  Right  AIMS (if indicated):     Assets:  Communication Skills Desire for Improvement Housing Social Support  ADL's:  Intact   Cognition:  WNL  Sleep:   fair      Assessment and Plan: PTSD.  Major depressive disorder, recurrent.  I reviewed blood work results and notes from other provider.  Her last BUN was 10 and creatinine 0.9.  She is taking Tegretol Topamax from her neurologist and she had recently seizures which last more than 25 minutes and she was seen in the emergency room.  We discussed trying a different medication to help the PTSD.  Currently she is taking Minipress 1 mg and she is also taking multiple medicine to control her blood pressure.  I will defer increasing the dose of Minipress as patient already taking multiple medicine for blood pressure.  We talked about trying trazodone 50-100 mg to help with insomnia and nightmares.  She agreed to give a try.  I will continue Pristiq 100 mg daily.  We will also refer her to see a therapist to help her coping skills.  I encouraged to keep appointment with neurologist for seizure medicine and keep appointment with a new PCP to see if they can help to find a therapist.  Recommended to call us back if she has any question or any concern.  Discussed medication side effects and benefits.  Follow-up in 3 months.  Follow Up Instructions:    I discussed the assessment and treatment plan with the patient. The patient was provided an opportunity to ask questions and all were answered. The patient agreed with the plan and demonstrated an understanding of the instructions.   The patient was advised to call back or seek an in-person evaluation if the symptoms worsen or if the condition fails to improve as anticipated.  I provided 25 minutes of non-face-to-face time during this encounter.   Kathlee Nations, MD

## 2021-04-06 NOTE — Progress Notes (Signed)
[facility]  [address] [place],  [place]  [address] [phone]  Clinic Day:  04/07/2021  Referring physician: Imagene Riches, NP   HISTORY OF PRESENT ILLNESS:  The patient is a 45 y.o. female with iron deficiency anemia secondary to heavy menstrual cycles.  In the past, she has received multiple courses of IV Feraheme to offset the severity of her iron deficiency from her menstrual cycles.  Her last course of IV Feraheme was given in October 2021.  She comes in today to reassess her iron and hemoglobin levels.  Over the past few weeks, there have been changes in her health.  Her mother reports her face has become swollen.  She also has right arm swelling.  The patient also complains of being short of breath.  She denies having any overt forms of blood loss since her last visit.   PHYSICAL EXAM:  Blood pressure (!) 146/79, pulse 96, temperature 98.5 F (36.9 C), resp. rate 16, height 5\' 1"  (1.549 m), SpO2 100 %. Body mass index is 68.02 kg/m. Weight 309 lb Performance status (ECOG): 2 Physical Exam Constitutional:      Appearance: Normal appearance. She is not ill-appearing.     Comments: Prominent facial swelling seen  HENT:     Mouth/Throat:     Mouth: Mucous membranes are moist.     Pharynx: Oropharynx is clear. No oropharyngeal exudate or posterior oropharyngeal erythema.  Cardiovascular:     Rate and Rhythm: Normal rate and regular rhythm.     Heart sounds: No murmur heard. No friction rub. No gallop.   Pulmonary:     Effort: Pulmonary effort is normal. No respiratory distress.     Breath sounds: No wheezing, rhonchi or rales.     Comments: Decreased breath sounds bilaterally Chest:  Breasts:     Right: No axillary adenopathy or supraclavicular adenopathy.     Left: No axillary adenopathy or supraclavicular adenopathy.    Abdominal:     General: Bowel sounds are normal. There is no distension.     Palpations: Abdomen is soft.  There is no mass.     Tenderness: There is no abdominal tenderness.  Musculoskeletal:        General: No swelling.     Right upper arm: Swelling present.     Right lower leg: No edema.     Left lower leg: No edema.  Lymphadenopathy:     Cervical: No cervical adenopathy.     Upper Body:     Right upper body: No supraclavicular or axillary adenopathy.     Left upper body: No supraclavicular or axillary adenopathy.     Lower Body: No right inguinal adenopathy. No left inguinal adenopathy.  Skin:    General: Skin is warm.     Coloration: Skin is not jaundiced.     Findings: No lesion or rash.  Neurological:     General: No focal deficit present.     Mental Status: She is alert and oriented to person, place, and time. Mental status is at baseline.     Cranial Nerves: Cranial nerves are intact.  Psychiatric:        Mood and Affect: Mood normal.        Behavior: Behavior normal.        Thought Content: Thought content normal.    LABS:   CBC Latest Ref Rng & Units 04/07/2021 01/14/2021 12/08/2020  WBC - 7.1 7.7 6.4  Hemoglobin 12.0 - 16.0  9.9(A) 12.8 14.3  Hematocrit 36 - 46 33(A) 40.0 44  Platelets 150 - 399 286 273 259   CMP Latest Ref Rng & Units 04/07/2021 01/14/2021 07/09/2020  Glucose 65 - 99 mg/dL - 123(H) 101(H)  BUN 4 - 21 6 8 8   Creatinine 0.5 - 1.1 0.7 0.86 0.90  Sodium 137 - 147 136(A) 140 141  Potassium 3.4 - 5.3 3.6 4.4 4.7  Chloride 99 - 108 98(A) 109(H) 110(H)  CO2 13 - 22 30(A) 18(L) 17(L)  Calcium 8.7 - 10.7 9.0 9.1 9.2  Total Protein 6.0 - 8.5 g/dL - 7.0 6.9  Total Bilirubin 0.0 - 1.2 mg/dL - 0.3 0.3  Alkaline Phos 25 - 125 137(A) 108 103  AST 13 - 35 27 24 13   ALT 7 - 35 21 18 11     Ref. Range 04/07/2021 14:22  Iron Latest Ref Range: 28 - 170 ug/dL 58  UIBC Latest Units: ug/dL 443  TIBC Latest Ref Range: 250 - 450 ug/dL 501 (H)  Saturation Ratios Latest Ref Range: 10.4 - 31.8 % 12  Ferritin Latest Ref Range: 11 - 307 ng/mL 8 (L)    ASSESSMENT & PLAN:   Assessment/Plan:  A 45 y.o. female with iro deficiency anemia.  When evaluating her labs today, she clearly has recurrent iron deficiency anemia for which I will arrange for her to receive IV iron in the forthcoming weeks.  However, I am concerned a blood clot may be emanating from around her port, which may be causing her right arm swelling and facial fullness. With her shortness of breath, I am also concerned about her having either congestive heart failure or a possible pulmonary embolus.  Due to her acute health issues, we will take her to the emergency room so she can be evaluated further.  She likely will need to be admitted.  I will see her back in 3 months for repeat clinical assessment.   The patient understands all the plans discussed today and is in agreement with them.    Field Staniszewski Macarthur Critchley, MD

## 2021-04-07 ENCOUNTER — Other Ambulatory Visit: Payer: Self-pay

## 2021-04-07 ENCOUNTER — Inpatient Hospital Stay (INDEPENDENT_AMBULATORY_CARE_PROVIDER_SITE_OTHER): Payer: Medicare Other | Admitting: Oncology

## 2021-04-07 ENCOUNTER — Other Ambulatory Visit: Payer: Self-pay | Admitting: Hematology and Oncology

## 2021-04-07 ENCOUNTER — Inpatient Hospital Stay: Payer: Medicare Other | Attending: Oncology

## 2021-04-07 VITALS — BP 146/79 | HR 96 | Temp 98.5°F | Resp 16 | Ht 61.0 in

## 2021-04-07 DIAGNOSIS — Z91041 Radiographic dye allergy status: Secondary | ICD-10-CM | POA: Diagnosis not present

## 2021-04-07 DIAGNOSIS — E119 Type 2 diabetes mellitus without complications: Secondary | ICD-10-CM | POA: Diagnosis not present

## 2021-04-07 DIAGNOSIS — R601 Generalized edema: Secondary | ICD-10-CM | POA: Diagnosis not present

## 2021-04-07 DIAGNOSIS — Z452 Encounter for adjustment and management of vascular access device: Secondary | ICD-10-CM | POA: Diagnosis not present

## 2021-04-07 DIAGNOSIS — F32A Depression, unspecified: Secondary | ICD-10-CM | POA: Diagnosis not present

## 2021-04-07 DIAGNOSIS — Z881 Allergy status to other antibiotic agents status: Secondary | ICD-10-CM | POA: Diagnosis not present

## 2021-04-07 DIAGNOSIS — D500 Iron deficiency anemia secondary to blood loss (chronic): Secondary | ICD-10-CM

## 2021-04-07 DIAGNOSIS — D509 Iron deficiency anemia, unspecified: Secondary | ICD-10-CM | POA: Insufficient documentation

## 2021-04-07 DIAGNOSIS — E876 Hypokalemia: Secondary | ICD-10-CM | POA: Diagnosis not present

## 2021-04-07 DIAGNOSIS — R600 Localized edema: Secondary | ICD-10-CM | POA: Diagnosis not present

## 2021-04-07 DIAGNOSIS — R569 Unspecified convulsions: Secondary | ICD-10-CM | POA: Diagnosis not present

## 2021-04-07 DIAGNOSIS — R2689 Other abnormalities of gait and mobility: Secondary | ICD-10-CM | POA: Diagnosis not present

## 2021-04-07 DIAGNOSIS — Z23 Encounter for immunization: Secondary | ICD-10-CM | POA: Diagnosis not present

## 2021-04-07 DIAGNOSIS — I10 Essential (primary) hypertension: Secondary | ICD-10-CM | POA: Diagnosis not present

## 2021-04-07 DIAGNOSIS — Z7952 Long term (current) use of systemic steroids: Secondary | ICD-10-CM | POA: Diagnosis not present

## 2021-04-07 DIAGNOSIS — Z91040 Latex allergy status: Secondary | ICD-10-CM | POA: Diagnosis not present

## 2021-04-07 DIAGNOSIS — Z888 Allergy status to other drugs, medicaments and biological substances status: Secondary | ICD-10-CM | POA: Diagnosis not present

## 2021-04-07 DIAGNOSIS — Z79899 Other long term (current) drug therapy: Secondary | ICD-10-CM | POA: Diagnosis not present

## 2021-04-07 DIAGNOSIS — M545 Low back pain, unspecified: Secondary | ICD-10-CM | POA: Diagnosis not present

## 2021-04-07 DIAGNOSIS — Z885 Allergy status to narcotic agent status: Secondary | ICD-10-CM | POA: Diagnosis not present

## 2021-04-07 DIAGNOSIS — G4733 Obstructive sleep apnea (adult) (pediatric): Secondary | ICD-10-CM | POA: Diagnosis not present

## 2021-04-07 DIAGNOSIS — J811 Chronic pulmonary edema: Secondary | ICD-10-CM | POA: Diagnosis not present

## 2021-04-07 DIAGNOSIS — R531 Weakness: Secondary | ICD-10-CM | POA: Diagnosis not present

## 2021-04-07 LAB — HEPATIC FUNCTION PANEL
ALT: 21 (ref 7–35)
AST: 27 (ref 13–35)
Alkaline Phosphatase: 137 — AB (ref 25–125)
Bilirubin, Total: 0.5

## 2021-04-07 LAB — CBC AND DIFFERENTIAL
HCT: 33 — AB (ref 36–46)
Hemoglobin: 9.9 — AB (ref 12.0–16.0)
Neutrophils Absolute: 5.47
Platelets: 286 (ref 150–399)
WBC: 7.1

## 2021-04-07 LAB — BASIC METABOLIC PANEL
BUN: 6 (ref 4–21)
CO2: 30 — AB (ref 13–22)
Chloride: 98 — AB (ref 99–108)
Creatinine: 0.7 (ref 0.5–1.1)
Glucose: 145
Potassium: 3.6 (ref 3.4–5.3)
Sodium: 136 — AB (ref 137–147)

## 2021-04-07 LAB — IRON AND TIBC
Iron: 58 ug/dL (ref 28–170)
Saturation Ratios: 12 % (ref 10.4–31.8)
TIBC: 501 ug/dL — ABNORMAL HIGH (ref 250–450)
UIBC: 443 ug/dL

## 2021-04-07 LAB — COMPREHENSIVE METABOLIC PANEL
Albumin: 4.3 (ref 3.5–5.0)
Calcium: 9 (ref 8.7–10.7)

## 2021-04-07 LAB — CBC: RBC: 4.84 (ref 3.87–5.11)

## 2021-04-07 LAB — FERRITIN: Ferritin: 8 ng/mL — ABNORMAL LOW (ref 11–307)

## 2021-04-08 ENCOUNTER — Other Ambulatory Visit: Payer: Medicare Other

## 2021-04-08 ENCOUNTER — Ambulatory Visit: Payer: Self-pay | Admitting: Oncology

## 2021-04-13 ENCOUNTER — Telehealth: Payer: Self-pay

## 2021-04-13 ENCOUNTER — Telehealth: Payer: Self-pay | Admitting: Oncology

## 2021-04-13 ENCOUNTER — Other Ambulatory Visit: Payer: Self-pay | Admitting: Oncology

## 2021-04-13 NOTE — Telephone Encounter (Signed)
Pt's mom called, LVM to get appt for pt's iron infusions as Neiva's iron level is really low.  I confirmed with Dr Bobby Rumpf, that pt needs IV iron.   I called Mardene Celeste, pt's mom back, and notified her that we will need to get the iron infusions authorize. Once authorized then schedulers will call her for appt.  I sent message to Beverley Fiedler and [employer]. I sent scheduling message as well.

## 2021-04-13 NOTE — Telephone Encounter (Signed)
04/13/21 Spoke with patients mother and scheduled IV iron appts

## 2021-04-14 ENCOUNTER — Other Ambulatory Visit: Payer: Self-pay

## 2021-04-14 ENCOUNTER — Inpatient Hospital Stay: Payer: Medicare Other

## 2021-04-14 VITALS — BP 139/75 | HR 77 | Temp 98.4°F | Resp 18 | Ht 61.0 in | Wt 309.4 lb

## 2021-04-14 DIAGNOSIS — D500 Iron deficiency anemia secondary to blood loss (chronic): Secondary | ICD-10-CM

## 2021-04-14 DIAGNOSIS — D509 Iron deficiency anemia, unspecified: Secondary | ICD-10-CM | POA: Diagnosis not present

## 2021-04-14 DIAGNOSIS — E876 Hypokalemia: Secondary | ICD-10-CM | POA: Diagnosis not present

## 2021-04-14 DIAGNOSIS — E1159 Type 2 diabetes mellitus with other circulatory complications: Secondary | ICD-10-CM | POA: Diagnosis not present

## 2021-04-14 DIAGNOSIS — Z7689 Persons encountering health services in other specified circumstances: Secondary | ICD-10-CM | POA: Diagnosis not present

## 2021-04-14 DIAGNOSIS — Z79899 Other long term (current) drug therapy: Secondary | ICD-10-CM | POA: Diagnosis not present

## 2021-04-14 MED ORDER — SODIUM CHLORIDE 0.9 % IV SOLN
510.0000 mg | Freq: Once | INTRAVENOUS | Status: AC
  Administered 2021-04-14: 510 mg via INTRAVENOUS
  Filled 2021-04-14: qty 17

## 2021-04-14 MED ORDER — SODIUM CHLORIDE 0.9 % IV SOLN
Freq: Once | INTRAVENOUS | Status: AC
  Filled 2021-04-14: qty 250

## 2021-04-14 MED ORDER — HEPARIN SOD (PORK) LOCK FLUSH 100 UNIT/ML IV SOLN
500.0000 [IU] | Freq: Once | INTRAVENOUS | Status: DC | PRN
  Filled 2021-04-14: qty 5

## 2021-04-14 NOTE — Patient Instructions (Signed)
Ferumoxytol injection What is this medicine? FERUMOXYTOL is an iron complex. Iron is used to make healthy red blood cells, which carry oxygen and nutrients throughout the body. This medicine is used to treat iron deficiency anemia. This medicine may be used for other purposes; ask your health care provider or pharmacist if you have questions. COMMON BRAND NAME(S): Feraheme What should I tell my health care provider before I take this medicine? They need to know if you have any of these conditions:  anemia not caused by low iron levels  high levels of iron in the blood  magnetic resonance imaging (MRI) test scheduled  an unusual or allergic reaction to iron, other medicines, foods, dyes, or preservatives  pregnant or trying to get pregnant  breast-feeding How should I use this medicine? This medicine is for injection into a vein. It is given by a health care professional in a hospital or clinic setting. Talk to your pediatrician regarding the use of this medicine in children. Special care may be needed. Overdosage: If you think you have taken too much of this medicine contact a poison control center or emergency room at once. NOTE: This medicine is only for you. Do not share this medicine with others. What if I miss a dose? It is important not to miss your dose. Call your doctor or health care professional if you are unable to keep an appointment. What may interact with this medicine? This medicine may interact with the following medications:  other iron products This list may not describe all possible interactions. Give your health care provider a list of all the medicines, herbs, non-prescription drugs, or dietary supplements you use. Also tell them if you smoke, drink alcohol, or use illegal drugs. Some items may interact with your medicine. What should I watch for while using this medicine? Visit your doctor or healthcare professional regularly. Tell your doctor or healthcare  professional if your symptoms do not start to get better or if they get worse. You may need blood work done while you are taking this medicine. You may need to follow a special diet. Talk to your doctor. Foods that contain iron include: whole grains/cereals, dried fruits, beans, or peas, leafy green vegetables, and organ meats (liver, kidney). What side effects may I notice from receiving this medicine? Side effects that you should report to your doctor or health care professional as soon as possible:  allergic reactions like skin rash, itching or hives, swelling of the face, lips, or tongue  breathing problems  changes in blood pressure  feeling faint or lightheaded, falls  fever or chills  flushing, sweating, or hot feelings  swelling of the ankles or feet Side effects that usually do not require medical attention (report to your doctor or health care professional if they continue or are bothersome):  diarrhea  headache  nausea, vomiting  stomach pain This list may not describe all possible side effects. Call your doctor for medical advice about side effects. You may report side effects to FDA at 1-800-FDA-1088. Where should I keep my medicine? This drug is given in a hospital or clinic and will not be stored at home. NOTE: This sheet is a summary. It may not cover all possible information. If you have questions about this medicine, talk to your doctor, pharmacist, or health care provider.  2021 Elsevier/Gold Standard (2017-01-24 20:21:10)  

## 2021-04-14 NOTE — Progress Notes (Signed)
1115 PT STABLE AT TIME OF DISCHARGE. 

## 2021-04-20 ENCOUNTER — Inpatient Hospital Stay: Payer: Medicare Other | Attending: Oncology

## 2021-04-20 ENCOUNTER — Other Ambulatory Visit: Payer: Self-pay

## 2021-04-20 VITALS — BP 168/82 | HR 120 | Resp 18 | Ht 61.0 in | Wt 344.5 lb

## 2021-04-20 DIAGNOSIS — D500 Iron deficiency anemia secondary to blood loss (chronic): Secondary | ICD-10-CM | POA: Insufficient documentation

## 2021-04-20 DIAGNOSIS — N920 Excessive and frequent menstruation with regular cycle: Secondary | ICD-10-CM | POA: Diagnosis not present

## 2021-04-20 MED ORDER — HEPARIN SOD (PORK) LOCK FLUSH 100 UNIT/ML IV SOLN
500.0000 [IU] | Freq: Once | INTRAVENOUS | Status: AC | PRN
  Administered 2021-04-20: 500 [IU]
  Filled 2021-04-20: qty 5

## 2021-04-20 MED ORDER — ALTEPLASE 2 MG IJ SOLR
2.0000 mg | Freq: Once | INTRAMUSCULAR | Status: AC | PRN
  Administered 2021-04-20: 2 mg
  Filled 2021-04-20: qty 2

## 2021-04-20 MED ORDER — SODIUM CHLORIDE 0.9 % IV SOLN
510.0000 mg | Freq: Once | INTRAVENOUS | Status: AC
  Administered 2021-04-20: 510 mg via INTRAVENOUS
  Filled 2021-04-20: qty 17

## 2021-04-20 MED ORDER — SODIUM CHLORIDE 0.9 % IV SOLN
Freq: Once | INTRAVENOUS | Status: AC
  Filled 2021-04-20: qty 250

## 2021-04-20 MED ORDER — ALTEPLASE 2 MG IJ SOLR
INTRAMUSCULAR | Status: AC
  Filled 2021-04-20: qty 2

## 2021-04-20 NOTE — Progress Notes (Signed)
1304:  Patient left accessed per orders of Beckey Rutter NP to dwell unable to retrieve cathflo or obtain blood return. Will return in am for flush and check port. PT STABLE AT TIME OF DISCHARGE

## 2021-04-20 NOTE — Patient Instructions (Signed)
Ferumoxytol injection What is this medicine? FERUMOXYTOL is an iron complex. Iron is used to make healthy red blood cells, which carry oxygen and nutrients throughout the body. This medicine is used to treat iron deficiency anemia. This medicine may be used for other purposes; ask your health care provider or pharmacist if you have questions. COMMON BRAND NAME(S): Feraheme What should I tell my health care provider before I take this medicine? They need to know if you have any of these conditions:  anemia not caused by low iron levels  high levels of iron in the blood  magnetic resonance imaging (MRI) test scheduled  an unusual or allergic reaction to iron, other medicines, foods, dyes, or preservatives  pregnant or trying to get pregnant  breast-feeding How should I use this medicine? This medicine is for injection into a vein. It is given by a health care professional in a hospital or clinic setting. Talk to your pediatrician regarding the use of this medicine in children. Special care may be needed. Overdosage: If you think you have taken too much of this medicine contact a poison control center or emergency room at once. NOTE: This medicine is only for you. Do not share this medicine with others. What if I miss a dose? It is important not to miss your dose. Call your doctor or health care professional if you are unable to keep an appointment. What may interact with this medicine? This medicine may interact with the following medications:  other iron products This list may not describe all possible interactions. Give your health care provider a list of all the medicines, herbs, non-prescription drugs, or dietary supplements you use. Also tell them if you smoke, drink alcohol, or use illegal drugs. Some items may interact with your medicine. What should I watch for while using this medicine? Visit your doctor or healthcare professional regularly. Tell your doctor or healthcare  professional if your symptoms do not start to get better or if they get worse. You may need blood work done while you are taking this medicine. You may need to follow a special diet. Talk to your doctor. Foods that contain iron include: whole grains/cereals, dried fruits, beans, or peas, leafy green vegetables, and organ meats (liver, kidney). What side effects may I notice from receiving this medicine? Side effects that you should report to your doctor or health care professional as soon as possible:  allergic reactions like skin rash, itching or hives, swelling of the face, lips, or tongue  breathing problems  changes in blood pressure  feeling faint or lightheaded, falls  fever or chills  flushing, sweating, or hot feelings  swelling of the ankles or feet Side effects that usually do not require medical attention (report to your doctor or health care professional if they continue or are bothersome):  diarrhea  headache  nausea, vomiting  stomach pain This list may not describe all possible side effects. Call your doctor for medical advice about side effects. You may report side effects to FDA at 1-800-FDA-1088. Where should I keep my medicine? This drug is given in a hospital or clinic and will not be stored at home. NOTE: This sheet is a summary. It may not cover all possible information. If you have questions about this medicine, talk to your doctor, pharmacist, or health care provider.  2021 Elsevier/Gold Standard (2017-01-24 20:21:10)  

## 2021-04-21 ENCOUNTER — Other Ambulatory Visit: Payer: Self-pay | Admitting: Oncology

## 2021-04-21 ENCOUNTER — Other Ambulatory Visit: Payer: Self-pay | Admitting: Hematology and Oncology

## 2021-04-21 ENCOUNTER — Inpatient Hospital Stay: Payer: Medicare Other

## 2021-04-21 VITALS — BP 171/79 | HR 115 | Resp 20 | Ht 61.0 in | Wt 344.2 lb

## 2021-04-21 DIAGNOSIS — Z95828 Presence of other vascular implants and grafts: Secondary | ICD-10-CM

## 2021-04-21 DIAGNOSIS — T829XXA Unspecified complication of cardiac and vascular prosthetic device, implant and graft, initial encounter: Secondary | ICD-10-CM

## 2021-04-21 DIAGNOSIS — D500 Iron deficiency anemia secondary to blood loss (chronic): Secondary | ICD-10-CM

## 2021-04-21 MED ORDER — HEPARIN SOD (PORK) LOCK FLUSH 100 UNIT/ML IV SOLN
500.0000 [IU] | Freq: Once | INTRAVENOUS | Status: AC
  Administered 2021-04-21: 500 [IU] via INTRAVENOUS
  Filled 2021-04-21: qty 5

## 2021-04-21 MED ORDER — PREDNISONE 50 MG PO TABS: ORAL_TABLET | ORAL | 0 refills | Status: DC

## 2021-04-21 MED ORDER — HEPARIN SOD (PORK) LOCK FLUSH 100 UNIT/ML IV SOLN
500.0000 [IU] | Freq: Once | INTRAVENOUS | Status: DC
  Filled 2021-04-21: qty 5

## 2021-04-21 NOTE — Progress Notes (Signed)
0840: Patient here today for port flush dwelled overnight with cathflo instilled. Unable to obtain blood return per policy. Orders per Rosanne Sack PAC for dye study. Patient allergy to contrast/dye. Rx sent to [facility] for 13 hr prep prior to study. Schedulers will call them with appt.. Dr Corena Pilgrim office alerted to issue. They will check study results and follow up for intervention needed per Butch Penny  at his office. Discussed plan with her Mother also agrees with all discussed today. 0910:PT STABLE AT TIME OF DISCHARGE

## 2021-04-23 DIAGNOSIS — E119 Type 2 diabetes mellitus without complications: Secondary | ICD-10-CM | POA: Diagnosis not present

## 2021-04-23 DIAGNOSIS — I10 Essential (primary) hypertension: Secondary | ICD-10-CM | POA: Diagnosis not present

## 2021-04-23 DIAGNOSIS — Z91040 Latex allergy status: Secondary | ICD-10-CM | POA: Diagnosis not present

## 2021-04-23 DIAGNOSIS — E876 Hypokalemia: Secondary | ICD-10-CM | POA: Diagnosis not present

## 2021-04-23 DIAGNOSIS — Z888 Allergy status to other drugs, medicaments and biological substances status: Secondary | ICD-10-CM | POA: Diagnosis not present

## 2021-04-23 DIAGNOSIS — F32A Depression, unspecified: Secondary | ICD-10-CM | POA: Diagnosis not present

## 2021-04-23 DIAGNOSIS — Z7952 Long term (current) use of systemic steroids: Secondary | ICD-10-CM | POA: Diagnosis not present

## 2021-04-23 DIAGNOSIS — Z881 Allergy status to other antibiotic agents status: Secondary | ICD-10-CM | POA: Diagnosis not present

## 2021-04-23 DIAGNOSIS — R531 Weakness: Secondary | ICD-10-CM | POA: Diagnosis not present

## 2021-04-23 DIAGNOSIS — Z91041 Radiographic dye allergy status: Secondary | ICD-10-CM | POA: Diagnosis not present

## 2021-04-23 DIAGNOSIS — Z885 Allergy status to narcotic agent status: Secondary | ICD-10-CM | POA: Diagnosis not present

## 2021-04-23 DIAGNOSIS — R601 Generalized edema: Secondary | ICD-10-CM | POA: Diagnosis not present

## 2021-04-23 DIAGNOSIS — D500 Iron deficiency anemia secondary to blood loss (chronic): Secondary | ICD-10-CM | POA: Diagnosis not present

## 2021-04-23 DIAGNOSIS — T82828A Fibrosis of vascular prosthetic devices, implants and grafts, initial encounter: Secondary | ICD-10-CM | POA: Diagnosis not present

## 2021-04-23 DIAGNOSIS — Z79899 Other long term (current) drug therapy: Secondary | ICD-10-CM | POA: Diagnosis not present

## 2021-04-23 DIAGNOSIS — G4733 Obstructive sleep apnea (adult) (pediatric): Secondary | ICD-10-CM | POA: Diagnosis not present

## 2021-04-29 DIAGNOSIS — G4733 Obstructive sleep apnea (adult) (pediatric): Secondary | ICD-10-CM | POA: Diagnosis not present

## 2021-05-05 ENCOUNTER — Encounter: Payer: Self-pay | Admitting: Oncology

## 2021-05-06 ENCOUNTER — Other Ambulatory Visit (HOSPITAL_COMMUNITY): Payer: Self-pay | Admitting: Psychiatry

## 2021-05-06 DIAGNOSIS — F4310 Post-traumatic stress disorder, unspecified: Secondary | ICD-10-CM

## 2021-05-07 DIAGNOSIS — Z713 Dietary counseling and surveillance: Secondary | ICD-10-CM | POA: Diagnosis not present

## 2021-05-07 DIAGNOSIS — E119 Type 2 diabetes mellitus without complications: Secondary | ICD-10-CM | POA: Diagnosis not present

## 2021-05-07 DIAGNOSIS — D500 Iron deficiency anemia secondary to blood loss (chronic): Secondary | ICD-10-CM | POA: Diagnosis not present

## 2021-05-11 DIAGNOSIS — E119 Type 2 diabetes mellitus without complications: Secondary | ICD-10-CM | POA: Diagnosis not present

## 2021-05-11 DIAGNOSIS — Z713 Dietary counseling and surveillance: Secondary | ICD-10-CM | POA: Diagnosis not present

## 2021-05-13 DIAGNOSIS — D500 Iron deficiency anemia secondary to blood loss (chronic): Secondary | ICD-10-CM | POA: Diagnosis not present

## 2021-05-13 DIAGNOSIS — I10 Essential (primary) hypertension: Secondary | ICD-10-CM | POA: Diagnosis not present

## 2021-05-13 DIAGNOSIS — G4733 Obstructive sleep apnea (adult) (pediatric): Secondary | ICD-10-CM | POA: Diagnosis not present

## 2021-05-13 DIAGNOSIS — E119 Type 2 diabetes mellitus without complications: Secondary | ICD-10-CM | POA: Diagnosis not present

## 2021-05-13 DIAGNOSIS — Z9989 Dependence on other enabling machines and devices: Secondary | ICD-10-CM | POA: Diagnosis not present

## 2021-05-14 DIAGNOSIS — I10 Essential (primary) hypertension: Secondary | ICD-10-CM | POA: Diagnosis not present

## 2021-05-14 DIAGNOSIS — R609 Edema, unspecified: Secondary | ICD-10-CM | POA: Diagnosis not present

## 2021-05-14 DIAGNOSIS — R5381 Other malaise: Secondary | ICD-10-CM | POA: Diagnosis not present

## 2021-05-14 DIAGNOSIS — E1159 Type 2 diabetes mellitus with other circulatory complications: Secondary | ICD-10-CM | POA: Diagnosis not present

## 2021-05-19 DIAGNOSIS — E119 Type 2 diabetes mellitus without complications: Secondary | ICD-10-CM | POA: Diagnosis not present

## 2021-05-19 DIAGNOSIS — G4733 Obstructive sleep apnea (adult) (pediatric): Secondary | ICD-10-CM | POA: Diagnosis not present

## 2021-05-19 DIAGNOSIS — Z9989 Dependence on other enabling machines and devices: Secondary | ICD-10-CM | POA: Diagnosis not present

## 2021-05-19 DIAGNOSIS — D500 Iron deficiency anemia secondary to blood loss (chronic): Secondary | ICD-10-CM | POA: Diagnosis not present

## 2021-05-19 DIAGNOSIS — I10 Essential (primary) hypertension: Secondary | ICD-10-CM | POA: Diagnosis not present

## 2021-05-20 DIAGNOSIS — E119 Type 2 diabetes mellitus without complications: Secondary | ICD-10-CM | POA: Diagnosis not present

## 2021-05-21 ENCOUNTER — Telehealth (HOSPITAL_COMMUNITY): Payer: Self-pay | Admitting: *Deleted

## 2021-05-21 ENCOUNTER — Other Ambulatory Visit (HOSPITAL_COMMUNITY): Payer: Self-pay | Admitting: *Deleted

## 2021-05-21 DIAGNOSIS — F4310 Post-traumatic stress disorder, unspecified: Secondary | ICD-10-CM

## 2021-05-21 DIAGNOSIS — Z9989 Dependence on other enabling machines and devices: Secondary | ICD-10-CM | POA: Diagnosis not present

## 2021-05-21 DIAGNOSIS — E119 Type 2 diabetes mellitus without complications: Secondary | ICD-10-CM | POA: Diagnosis not present

## 2021-05-21 DIAGNOSIS — G4733 Obstructive sleep apnea (adult) (pediatric): Secondary | ICD-10-CM | POA: Diagnosis not present

## 2021-05-21 DIAGNOSIS — D500 Iron deficiency anemia secondary to blood loss (chronic): Secondary | ICD-10-CM | POA: Diagnosis not present

## 2021-05-21 DIAGNOSIS — I10 Essential (primary) hypertension: Secondary | ICD-10-CM | POA: Diagnosis not present

## 2021-05-21 MED ORDER — TRAZODONE HCL 100 MG PO TABS: 50.0000 mg | ORAL_TABLET | Freq: Every day | ORAL | 2 refills | Status: DC

## 2021-05-21 NOTE — Telephone Encounter (Signed)
Ok to refill 

## 2021-05-21 NOTE — Telephone Encounter (Signed)
Received call form [facility] requesting refill of the Trazadone 100mg  half to whole tab qhs. Pt has an appointment scheduled for 06/17/21. Pt does have a refill for this month but since it's mail order pharmacy says in order not to interrupt regime refill needs to be sent soon. Ok to refill?

## 2021-05-26 DIAGNOSIS — I10 Essential (primary) hypertension: Secondary | ICD-10-CM | POA: Diagnosis not present

## 2021-05-26 DIAGNOSIS — Z9989 Dependence on other enabling machines and devices: Secondary | ICD-10-CM | POA: Diagnosis not present

## 2021-05-26 DIAGNOSIS — E119 Type 2 diabetes mellitus without complications: Secondary | ICD-10-CM | POA: Diagnosis not present

## 2021-05-26 DIAGNOSIS — D500 Iron deficiency anemia secondary to blood loss (chronic): Secondary | ICD-10-CM | POA: Diagnosis not present

## 2021-05-26 DIAGNOSIS — G4733 Obstructive sleep apnea (adult) (pediatric): Secondary | ICD-10-CM | POA: Diagnosis not present

## 2021-06-05 DIAGNOSIS — Z452 Encounter for adjustment and management of vascular access device: Secondary | ICD-10-CM | POA: Diagnosis not present

## 2021-06-05 DIAGNOSIS — D509 Iron deficiency anemia, unspecified: Secondary | ICD-10-CM | POA: Diagnosis not present

## 2021-06-05 DIAGNOSIS — D500 Iron deficiency anemia secondary to blood loss (chronic): Secondary | ICD-10-CM | POA: Diagnosis not present

## 2021-06-05 DIAGNOSIS — T82514A Breakdown (mechanical) of infusion catheter, initial encounter: Secondary | ICD-10-CM | POA: Diagnosis not present

## 2021-06-05 DIAGNOSIS — Z79899 Other long term (current) drug therapy: Secondary | ICD-10-CM | POA: Diagnosis not present

## 2021-06-05 DIAGNOSIS — T82868A Thrombosis of vascular prosthetic devices, implants and grafts, initial encounter: Secondary | ICD-10-CM | POA: Diagnosis not present

## 2021-06-05 DIAGNOSIS — I10 Essential (primary) hypertension: Secondary | ICD-10-CM | POA: Diagnosis not present

## 2021-06-05 DIAGNOSIS — J9811 Atelectasis: Secondary | ICD-10-CM | POA: Diagnosis not present

## 2021-06-05 DIAGNOSIS — T82594A Other mechanical complication of infusion catheter, initial encounter: Secondary | ICD-10-CM | POA: Diagnosis not present

## 2021-06-10 ENCOUNTER — Encounter: Payer: Self-pay | Admitting: Oncology

## 2021-06-17 ENCOUNTER — Telehealth (INDEPENDENT_AMBULATORY_CARE_PROVIDER_SITE_OTHER): Payer: Medicare Other | Admitting: Psychiatry

## 2021-06-17 ENCOUNTER — Encounter (HOSPITAL_COMMUNITY): Payer: Self-pay | Admitting: Psychiatry

## 2021-06-17 ENCOUNTER — Other Ambulatory Visit: Payer: Self-pay

## 2021-06-17 DIAGNOSIS — F330 Major depressive disorder, recurrent, mild: Secondary | ICD-10-CM | POA: Diagnosis not present

## 2021-06-17 DIAGNOSIS — F4310 Post-traumatic stress disorder, unspecified: Secondary | ICD-10-CM

## 2021-06-17 MED ORDER — TRAZODONE HCL 100 MG PO TABS: 50.0000 mg | ORAL_TABLET | Freq: Every day | ORAL | 2 refills | Status: DC

## 2021-06-17 MED ORDER — DESVENLAFAXINE ER 100 MG PO TB24: 100.0000 mg | ORAL_TABLET | Freq: Every day | ORAL | 2 refills | Status: DC

## 2021-06-17 NOTE — Progress Notes (Signed)
Virtual Visit via video Note  I connected with Jane Edwards on 06/17/21 at  2:20 PM EDT by video and verified that I am speaking with the correct person using two identifiers.  Location: Patient: Home Provider: Home Office   I discussed the limitations, risks, security and privacy concerns of performing an evaluation and management service by telephone and the availability of in person appointments. I also discussed with the patient that there may be a patient responsible charge related to this service. The patient expressed understanding and agreed to proceed.   History of Present Illness: Patient is evaluated by a video session.  Patient told in April she was briefly admitted at [facility] however we do not have any access to the notes.  Apparently patient had swelling and found to have increased blood sugar and possible clot at her right forearm port.  Patient told they diagnosed with diabetes and immediately stopped the steroids that she has been taking for a while.  Patient told since she stopped the steroids her sugar came down and she has lost a lot of weight.  Her swelling also came down.  She feels more active, start walking and have more energy.  She still have occasionally hallucination which she described chronic and sometimes she see spiders.  But overall she feels much better and noticed more optimistic about herself.  She still have seizures and last seizure was a few days ago.  She has a scheduled to see her neurologist.  She is taking multiple medication for her general health.  Her primary care physician Dr. Melvyn Novas is managing her chronic health issues.  Since swelling came down and blood sugar came down she had lost a lot of weight.  She is sleeping much better with the help of CPAP and trazodone that was started on the last visit.  She is taking 100 mg trazodone.  She denies any crying spells or any feeling of hopelessness or worthlessness.  However she is still  struggling with attention, concentration and she was told it could be due to swelling in the body and generally get better.  Patient currently not seeing any therapist but willing to see one if available to help her coping skills.   Past Psychiatric History: Reviewed. H/O PTSD and anxiety disorder. Saw psychiatrist in [place] state and given Minipress. Paxil caused frequent urination and Prozac caused interaction with Topamax. Minipress stopped working. No h/o suicidal attempt or inpatient treatment.       Psychiatric Specialty Exam: Physical Exam  Review of Systems  Weight (!) 320 lb (145.2 kg).Body mass index is 60.46 kg/m.  General Appearance: Fairly Groomed  Eye Contact:  Fair  Speech:  Slow  Volume:  Decreased  Mood:  Euthymic  Affect:  Congruent  Thought Process:  Descriptions of Associations: Intact  Orientation:  Full (Time, Place, and Person)  Thought Content:  Hallucinations: Visual Seeing spiders  Suicidal Thoughts:  No  Homicidal Thoughts:  No  Memory:  Immediate;   Fair Recent;   Fair Remote;   Fair  Judgement:  Fair  Insight:  Fair  Psychomotor Activity:  Decreased  Concentration:  Concentration: Fair and Attention Span: Fair  Recall:  [employer] of Knowledge:  Fair  Language:  Good  Akathisia:  No  Handed:  Right  AIMS (if indicated):     Assets:  Communication Skills Desire for Improvement Housing  ADL's:  Intact  Cognition:  WNL  Sleep:   better with trazodone and  CPAP    Assessment and Plan: PTSD.  Major depressive disorder, recurrent.  Patient is no longer taking the steroids after found to have a very high blood sugar, swelling due to steroids.  She had lost a lot of weight and her sleep is better.  I recommend that she can stop the Minipress as sleeping better from the trazodone and CPAP.  She is still taking multiple medication for her blood pressure and she will closely follow up with her PCP.  We will get records from her PCP Dr. Louanna Raw, [phone].  I encourage to keep appointment with neurologist as she continues to have seizures.  Patient like to have appointment with a therapist in our office.  We will provide names of the therapist as patient has unable to find a therapy appointment through the PCP.  I recommend to call us back if is any question or any concern.  We will continue Pristiq 100 mg daily and trazodone 100 mg at bedtime.  Discontinue Minipress.  Follow-up in 3 months.  Follow Up Instructions:    I discussed the assessment and treatment plan with the patient. The patient was provided an opportunity to ask questions and all were answered. The patient agreed with the plan and demonstrated an understanding of the instructions.   The patient was advised to call back or seek an in-person evaluation if the symptoms worsen or if the condition fails to improve as anticipated.  I provided 19 minutes of non-face-to-face time during this encounter.   Kathlee Nations, MD

## 2021-06-19 DIAGNOSIS — E119 Type 2 diabetes mellitus without complications: Secondary | ICD-10-CM | POA: Diagnosis not present

## 2021-06-19 DIAGNOSIS — Z9989 Dependence on other enabling machines and devices: Secondary | ICD-10-CM | POA: Diagnosis not present

## 2021-06-19 DIAGNOSIS — I10 Essential (primary) hypertension: Secondary | ICD-10-CM | POA: Diagnosis not present

## 2021-06-19 DIAGNOSIS — D500 Iron deficiency anemia secondary to blood loss (chronic): Secondary | ICD-10-CM | POA: Diagnosis not present

## 2021-06-19 DIAGNOSIS — G4733 Obstructive sleep apnea (adult) (pediatric): Secondary | ICD-10-CM | POA: Diagnosis not present

## 2021-07-03 DIAGNOSIS — D500 Iron deficiency anemia secondary to blood loss (chronic): Secondary | ICD-10-CM | POA: Diagnosis not present

## 2021-07-03 DIAGNOSIS — I872 Venous insufficiency (chronic) (peripheral): Secondary | ICD-10-CM | POA: Diagnosis not present

## 2021-07-07 ENCOUNTER — Ambulatory Visit (INDEPENDENT_AMBULATORY_CARE_PROVIDER_SITE_OTHER): Payer: Medicare Other | Admitting: Clinical

## 2021-07-07 ENCOUNTER — Other Ambulatory Visit: Payer: Self-pay

## 2021-07-07 DIAGNOSIS — F32A Depression, unspecified: Secondary | ICD-10-CM

## 2021-07-07 DIAGNOSIS — F419 Anxiety disorder, unspecified: Secondary | ICD-10-CM | POA: Diagnosis not present

## 2021-07-07 DIAGNOSIS — F4310 Post-traumatic stress disorder, unspecified: Secondary | ICD-10-CM | POA: Diagnosis not present
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
## 2021-07-11 ENCOUNTER — Other Ambulatory Visit (HOSPITAL_COMMUNITY): Payer: Self-pay | Admitting: Psychiatry

## 2021-07-11 DIAGNOSIS — F4310 Post-traumatic stress disorder, unspecified: Secondary | ICD-10-CM

## 2021-07-13 ENCOUNTER — Other Ambulatory Visit (HOSPITAL_COMMUNITY): Payer: Self-pay | Admitting: Psychiatry

## 2021-07-14 ENCOUNTER — Ambulatory Visit (INDEPENDENT_AMBULATORY_CARE_PROVIDER_SITE_OTHER): Payer: Medicare Other | Admitting: Neurology

## 2021-07-14 ENCOUNTER — Other Ambulatory Visit: Payer: Self-pay

## 2021-07-14 ENCOUNTER — Telehealth: Payer: Self-pay | Admitting: Neurology

## 2021-07-14 ENCOUNTER — Encounter: Payer: Self-pay | Admitting: Neurology

## 2021-07-14 VITALS — BP 126/79 | HR 71

## 2021-07-14 DIAGNOSIS — G40909 Epilepsy, unspecified, not intractable, without status epilepticus: Secondary | ICD-10-CM

## 2021-07-14 DIAGNOSIS — R799 Abnormal finding of blood chemistry, unspecified: Secondary | ICD-10-CM

## 2021-07-14 DIAGNOSIS — Z79899 Other long term (current) drug therapy: Secondary | ICD-10-CM

## 2021-07-14 NOTE — Patient Instructions (Signed)
Will send labs to Dr. Bobby Rumpf to be collected  Will get you set up with Dr. April Manson for seizure consultation
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
## 2021-07-14 NOTE — Addendum Note (Signed)
Addended by: Desmond Lope on: 07/14/2021 04:30 PM   Modules accepted: Orders

## 2021-07-14 NOTE — Telephone Encounter (Signed)
Patient has a new port a cath, hasn't been used yet, need to check labs, has appointment 8/12 with  [facility]. [facility]ology. She would like labs collected then, I am ok with this, I wasn't sure how to best place the ordered to ensure they are done, would need Topamax, carbamazepine level, I would like to see a CBC, CMP as well. Her appointment is 07/31/21.

## 2021-07-14 NOTE — Telephone Encounter (Signed)
I called the [facility] (ph: [phone]) and spoke to [place], [place]. Currently, she is not showing an appt for the patient but says is due to come in around that time in August. She is going to ask their appt schedulers to call and make the follow up with Dr. Bobby Rumpf. She requested we fax the lab orders to her attention at [phone]. They are also part of [facility] so the orders will also be available in Epic.

## 2021-07-15 ENCOUNTER — Encounter: Payer: Self-pay | Admitting: Neurology

## 2021-07-15 NOTE — Progress Notes (Signed)
I have read the note, and I agree with the clinical assessment and plan.  Nisreen Guise K Quill Grinder   

## 2021-07-15 NOTE — Telephone Encounter (Signed)
Judson Roch is out of the office today. Dr. Krista Blue signed the lab orders in her absence. Printed orders faxed and confirmed to number below.

## 2021-07-16 ENCOUNTER — Ambulatory Visit (INDEPENDENT_AMBULATORY_CARE_PROVIDER_SITE_OTHER): Payer: Medicare Other | Admitting: Clinical

## 2021-07-16 ENCOUNTER — Other Ambulatory Visit: Payer: Self-pay

## 2021-07-16 DIAGNOSIS — F32A Depression, unspecified: Secondary | ICD-10-CM

## 2021-07-16 DIAGNOSIS — F4310 Post-traumatic stress disorder, unspecified: Secondary | ICD-10-CM | POA: Diagnosis not present

## 2021-07-16 DIAGNOSIS — F419 Anxiety disorder, unspecified: Secondary | ICD-10-CM

## 2021-07-16 NOTE — Progress Notes (Signed)
   THERAPIST PROGRESS NOTE  Session Time: 2pm  Participation Level: Active  Behavioral Response: CasualAlertpleasant  Type of Therapy: Individual Therapy  Treatment Goals addressed: Coping  Interventions: Supportive Virtual Visit via Video Note  I connected with Jane Edwards on 07/16/21 at  2:00 PM EDT by a video enabled telemedicine application and verified that I am speaking with the correct person using two identifiers.  Location: Patient: home Provider: office   I discussed the limitations of evaluation and management by telemedicine and the availability of in person appointments. The patient expressed understanding and agreed to proceed.   I discussed the assessment and treatment plan with the patient. The patient was provided an opportunity to ask questions and all were answered. The patient agreed with the plan and demonstrated an understanding of the instructions.   The patient was advised to call back or seek an in-person evaluation if the symptoms worsen or if the condition fails to improve as anticipated.  I provided 40 minutes of non-face-to-face time during this encounter.   Summary: Jane Edwards is a 45 y.o. female who presents with a number of physical health problems that impairs her daily functioning. Pt reports she was recently dx with diabetes and working with medical team to manage it. Pt reports she is prescribed a number of medications that makes her sleepy throughout the day. Pt reports having a strong support system consisting of family members and friends. Pt lives with her parents.  Suicidal/Homicidal: Pt denies SI/HI no plan or intent to harm self or others reported.  Therapist Response: CSW assessed for changes in mood and behavior. CSW actively listened as pt discussed dynamics of relationship with members of her support system. CSW processed wit pt benefits of social support and probed for feedback on how she supports others. CSW discussed with  pt types of social support and how the emotional support that is offered can assist her with managing her emotions.  Plan: Return again in 1 weeks.  Diagnosis: Axis I: PTSD    Anxiety and depresion    Axis II: No diagnosis    Jane Rack, LCSW 07/16/2021

## 2021-07-17 DIAGNOSIS — E876 Hypokalemia: Secondary | ICD-10-CM | POA: Diagnosis not present

## 2021-07-17 DIAGNOSIS — I10 Essential (primary) hypertension: Secondary | ICD-10-CM | POA: Diagnosis not present

## 2021-07-17 DIAGNOSIS — E1159 Type 2 diabetes mellitus with other circulatory complications: Secondary | ICD-10-CM | POA: Diagnosis not present

## 2021-07-17 NOTE — Progress Notes (Signed)
[facility]  [address] [place],    [address] [phone]  Clinic Day:  07/31/2021  Referring physician: Leonides Sake, MD  This document serves as a record of services personally performed by Marice Potter, MD. It was created on their behalf by Curry,Lauren E, a trained medical scribe. The creation of this record is based on the scribe's personal observations and the provider's statements to them.  HISTORY OF PRESENT ILLNESS:  The patient is a 45 y.o. female with iron deficiency anemia secondary to heavy menstrual cycles.  In the past, she has received numerous courses of IV Feraheme to offset the severity of her iron deficiency from her menstrual cycles.  Her last course of IV Feraheme was given in October 2021.  She comes in today to reassess her iron and hemoglobin levels.  Since her last visit, she has been doing okay.  Outside of her menstrual cycles, she denies having other overt forms of blood loss.  PHYSICAL EXAM:  Blood pressure (!) 147/69, pulse 60, temperature 98 F (36.7 C), resp. rate 16, height '5\' 1"'$  (1.549 m), weight (!) 383 lb 8 oz (174 kg), SpO2 99 %. Body mass index is 72.46 kg/m. Weight 309 lb Performance status (ECOG): 2 Physical Exam Constitutional:      General: She is not in acute distress.    Appearance: Normal appearance. She is normal weight.  HENT:     Head: Normocephalic and atraumatic.  Eyes:     General: No scleral icterus.    Extraocular Movements: Extraocular movements intact.     Conjunctiva/sclera: Conjunctivae normal.     Pupils: Pupils are equal, round, and reactive to light.  Cardiovascular:     Rate and Rhythm: Normal rate and regular rhythm.     Pulses: Normal pulses.     Heart sounds: Normal heart sounds. No murmur heard.   No friction rub. No gallop.  Pulmonary:     Effort: Pulmonary effort is normal. No respiratory distress.     Breath sounds: Normal breath sounds.  Abdominal:      General: Bowel sounds are normal. There is no distension.     Palpations: Abdomen is soft. There is no hepatomegaly, splenomegaly or mass.     Tenderness: There is no abdominal tenderness.  Musculoskeletal:        General: Normal range of motion.     Cervical back: Normal range of motion and neck supple.     Right lower leg: No edema.     Left lower leg: No edema.  Lymphadenopathy:     Cervical: No cervical adenopathy.  Skin:    General: Skin is warm and dry.  Neurological:     General: No focal deficit present.     Mental Status: She is alert and oriented to person, place, and time. Mental status is at baseline.  Psychiatric:        Mood and Affect: Mood normal.        Behavior: Behavior normal.        Thought Content: Thought content normal.        Judgment: Judgment normal.   LABS:   CBC Latest Ref Rng & Units 07/31/2021 04/07/2021 01/14/2021  WBC - 4.8 7.1 7.7  Hemoglobin 12.0 - 16.0 11.8(A) 9.9(A) 12.8  Hematocrit 36 - 46 37 33(A) 40.0  Platelets 150 - 399 208 286 273   CMP Latest Ref Rng & Units 07/31/2021 04/07/2021 01/14/2021  Glucose 65 - 99 mg/dL - -  123(H)  BUN 4 - '21 9 6 8  '$ Creatinine 0.5 - 1.1 1.0 0.7 0.86  Sodium 137 - 147 138 136(A) 140  Potassium 3.4 - 5.3 4.0 3.6 4.4  Chloride 99 - 108 107 98(A) 109(H)  CO2 13 - 22 22 30(A) 18(L)  Calcium 8.7 - 10.7 8.9 9.0 9.1  Total Protein 6.0 - 8.5 g/dL - - 7.0  Total Bilirubin 0.0 - 1.2 mg/dL - - 0.3  Alkaline Phos 25 - 125 101 137(A) 108  AST 13 - 35 46(A) 27 24  ALT 7 - 35 32 21 18    Ref. Range 07/31/2021 10:45  Iron Latest Ref Range: 28 - 170 ug/dL 40  UIBC Latest Units: ug/dL 411  TIBC Latest Ref Range: 250 - 450 ug/dL 451 (H)  Saturation Ratios Latest Ref Range: 10.4 - 31.8 % 9 (L)  Ferritin Latest Ref Range: 11 - 307 ng/mL 11    ASSESSMENT & PLAN:  Assessment/Plan:  A 45 y.o. female with iro deficiency anemia.  When evaluating her labs today, I am pleased as her hemoglobin is definitely improved.  However,  she clearly has persistent iron deficiency for which I will arrange for her to receive IV iron in the forthcoming weeks.  Otherwise, I will see her back in 4 months for repeat clinical assessment.   The patient understands all the plans discussed today and is in agreement with them.     I, Rita Ohara, am acting as scribe for Marice Potter, MD    I have reviewed this report as typed by the medical scribe, and it is complete and accurate.  Dequincy Macarthur Critchley, MD

## 2021-07-20 ENCOUNTER — Other Ambulatory Visit: Payer: Self-pay | Admitting: Hematology and Oncology

## 2021-07-20 DIAGNOSIS — D500 Iron deficiency anemia secondary to blood loss (chronic): Secondary | ICD-10-CM

## 2021-07-21 DIAGNOSIS — E1159 Type 2 diabetes mellitus with other circulatory complications: Secondary | ICD-10-CM | POA: Diagnosis not present

## 2021-07-23 ENCOUNTER — Other Ambulatory Visit: Payer: Self-pay

## 2021-07-23 ENCOUNTER — Ambulatory Visit (INDEPENDENT_AMBULATORY_CARE_PROVIDER_SITE_OTHER): Payer: Medicare Other | Admitting: Clinical

## 2021-07-23 DIAGNOSIS — F4310 Post-traumatic stress disorder, unspecified: Secondary | ICD-10-CM | POA: Diagnosis not present

## 2021-07-23 DIAGNOSIS — F419 Anxiety disorder, unspecified: Secondary | ICD-10-CM | POA: Diagnosis not present

## 2021-07-23 DIAGNOSIS — F32A Depression, unspecified: Secondary | ICD-10-CM
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
## 2021-07-24 ENCOUNTER — Encounter: Payer: Self-pay | Admitting: Oncology

## 2021-07-24 ENCOUNTER — Other Ambulatory Visit (HOSPITAL_COMMUNITY): Payer: Self-pay | Admitting: Psychiatry

## 2021-07-28 DIAGNOSIS — G4733 Obstructive sleep apnea (adult) (pediatric): Secondary | ICD-10-CM | POA: Diagnosis not present

## 2021-07-30 ENCOUNTER — Ambulatory Visit (INDEPENDENT_AMBULATORY_CARE_PROVIDER_SITE_OTHER): Payer: Medicare Other | Admitting: Clinical

## 2021-07-30 ENCOUNTER — Other Ambulatory Visit: Payer: Self-pay

## 2021-07-30 DIAGNOSIS — F419 Anxiety disorder, unspecified: Secondary | ICD-10-CM | POA: Diagnosis not present

## 2021-07-30 DIAGNOSIS — F4310 Post-traumatic stress disorder, unspecified: Secondary | ICD-10-CM

## 2021-07-30 DIAGNOSIS — M545 Low back pain, unspecified: Secondary | ICD-10-CM | POA: Diagnosis not present

## 2021-07-30 DIAGNOSIS — F32A Depression, unspecified: Secondary | ICD-10-CM

## 2021-07-30 DIAGNOSIS — R569 Unspecified convulsions: Secondary | ICD-10-CM | POA: Diagnosis not present

## 2021-07-30 DIAGNOSIS — R2689 Other abnormalities of gait and mobility: Secondary | ICD-10-CM | POA: Diagnosis not present

## 2021-07-30 NOTE — Progress Notes (Signed)
   THERAPIST PROGRESS NOTE  Session Time: 2pm  Participation Level: Active  Behavioral Response: NAAlertEuthymic  Type of Therapy: Individual Therapy  Treatment Goals addressed: Coping  Interventions: CBT Virtual Visit via Telephone Note  I connected with Jane Edwards on 07/30/21 at  2:00 PM EDT by telephone and verified that I am speaking with the correct person using two identifiers.  Location: Patient: home Provider: office   I discussed the limitations, risks, security and privacy concerns of performing an evaluation and management service by telephone and the availability of in person appointments. I also discussed with the patient that there may be a patient responsible charge related to this service. The patient expressed understanding and agreed to proceed.   I discussed the assessment and treatment plan with the patient. The patient was provided an opportunity to ask questions and all were answered. The patient agreed with the plan and demonstrated an understanding of the instructions.   The patient was advised to call back or seek an in-person evaluation if the symptoms worsen or if the condition fails to improve as anticipated.  I provided 40 minutes of non-face-to-face time during this encounter.   Summary: Jane Edwards is a 45 y.o. female who reports her self care routine consists of taking her prescription medications as prescribed, playing video game, performing chair exercises 5 out of 7 days per week. Pt discussed feeling guilty about being disabled. Pt states she feels like she is not being helpful when she is not able to be physically present or engage in physical activity.  Suicidal/Homicidal: Pt denies SI/HI no plan, intent or attempt to harm self or others reported  Therapist Response: CSW reviewed and provided pt with self care tips. CSW discussed with pt various types of social support and the benefits of social support. CSW prompted pt to identify how  she supports others.CSW assigned homework assignment=identify 3 things grateful for each day, to be discussed during next session.  Plan: Return again in 1 weeks.  Diagnosis: Axis I: PTSD                                     Anxiety and depresion      Axis II: No diagnosis    Yvette Rack, LCSW 07/30/2021

## 2021-07-31 ENCOUNTER — Inpatient Hospital Stay: Payer: Medicare Other | Attending: Oncology | Admitting: Oncology

## 2021-07-31 ENCOUNTER — Encounter: Payer: Self-pay | Admitting: Oncology

## 2021-07-31 ENCOUNTER — Other Ambulatory Visit: Payer: Self-pay

## 2021-07-31 ENCOUNTER — Other Ambulatory Visit: Payer: Self-pay | Admitting: Oncology

## 2021-07-31 ENCOUNTER — Inpatient Hospital Stay: Payer: Medicare Other

## 2021-07-31 ENCOUNTER — Telehealth: Payer: Self-pay | Admitting: Oncology

## 2021-07-31 VITALS — BP 147/69 | HR 60 | Temp 98.0°F | Resp 16 | Ht 61.0 in | Wt 383.5 lb

## 2021-07-31 DIAGNOSIS — D500 Iron deficiency anemia secondary to blood loss (chronic): Secondary | ICD-10-CM | POA: Insufficient documentation

## 2021-07-31 DIAGNOSIS — Z79899 Other long term (current) drug therapy: Secondary | ICD-10-CM | POA: Insufficient documentation

## 2021-07-31 DIAGNOSIS — Z452 Encounter for adjustment and management of vascular access device: Secondary | ICD-10-CM | POA: Diagnosis not present

## 2021-07-31 DIAGNOSIS — N920 Excessive and frequent menstruation with regular cycle: Secondary | ICD-10-CM | POA: Insufficient documentation

## 2021-07-31 DIAGNOSIS — I872 Venous insufficiency (chronic) (peripheral): Secondary | ICD-10-CM | POA: Diagnosis not present

## 2021-07-31 LAB — COMPREHENSIVE METABOLIC PANEL
Albumin: 4 (ref 3.5–5.0)
Calcium: 8.9 (ref 8.7–10.7)

## 2021-07-31 LAB — LIPID PANEL
Cholesterol: 213 mg/dL — ABNORMAL HIGH (ref 0–200)
HDL: 75 mg/dL (ref >40)
LDL Cholesterol: 122 mg/dL — ABNORMAL HIGH (ref 0–99)
Total CHOL/HDL Ratio: 2.8 RATIO
Triglycerides: 79 mg/dL (ref <150)
VLDL: 16 mg/dL (ref 0–40)

## 2021-07-31 LAB — IRON AND TIBC
Iron: 40 ug/dL (ref 28–170)
Saturation Ratios: 9 % — ABNORMAL LOW (ref 10.4–31.8)
TIBC: 451 ug/dL — ABNORMAL HIGH (ref 250–450)
UIBC: 411 ug/dL

## 2021-07-31 LAB — BASIC METABOLIC PANEL
BUN: 9 (ref 4–21)
CO2: 22 (ref 13–22)
Chloride: 107 (ref 99–108)
Creatinine: 1 (ref 0.5–1.1)
Glucose: 95
Potassium: 4 (ref 3.4–5.3)
Sodium: 138 (ref 137–147)

## 2021-07-31 LAB — CBC AND DIFFERENTIAL
HCT: 37 (ref 36–46)
Hemoglobin: 11.8 — AB (ref 12.0–16.0)
Neutrophils Absolute: 3.22
Platelets: 208 (ref 150–399)
WBC: 4.8

## 2021-07-31 LAB — CBC: RBC: 4.6 (ref 3.87–5.11)

## 2021-07-31 LAB — HEPATIC FUNCTION PANEL
ALT: 32 (ref 7–35)
AST: 46 — AB (ref 13–35)
Alkaline Phosphatase: 101 (ref 25–125)
Bilirubin, Total: 0.3

## 2021-07-31 LAB — FERRITIN: Ferritin: 11 ng/mL (ref 11–307)

## 2021-07-31 NOTE — Progress Notes (Signed)
Left port accessed using sterile technique per policy. Immediate flash of blood return, but it would not draw enough blood back for labs to be drawn. I flushed the port with several saline flushes & it flushed very easily.  I repositioned pt to recliner lying back, had her to cough, turn her head, but nothing helped to get more blood return. I asked Levada Dy Evans,LPN, to look at port. She also confirmed that port flushed easily but would not return blood. She removed the [facility] needle and reaccessed the port with a new Huber needle, using sterile technique. She received a flash of blood back, but again not enough for lab draw. She then flushed the port with Heparin & deaccessed the port. 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 Blood was obtained via venipuncture with butterfly using Left AC per Angela,LPN.

## 2021-07-31 NOTE — Telephone Encounter (Signed)
Per 8/12 Los next appt scheduled and given to patient

## 2021-08-03 ENCOUNTER — Telehealth: Payer: Self-pay | Admitting: Neurology

## 2021-08-03 DIAGNOSIS — E119 Type 2 diabetes mellitus without complications: Secondary | ICD-10-CM | POA: Diagnosis not present

## 2021-08-03 NOTE — Telephone Encounter (Signed)
Called and spoke with pt's mother to schedule consult with Dr. April Manson per Lucita Ferrara recommendation, she stated that she would like to wait until October to set this up and would call us back.

## 2021-08-04 ENCOUNTER — Telehealth: Payer: Self-pay

## 2021-08-04 ENCOUNTER — Other Ambulatory Visit: Payer: Self-pay | Admitting: Pharmacist

## 2021-08-04 ENCOUNTER — Encounter: Payer: Self-pay | Admitting: Oncology

## 2021-08-04 NOTE — Telephone Encounter (Signed)
08/05/21 Appt's have been made by schedulers.   08/04/21 - Pt LVM on triage line asking for iron results. I called her mom back and told her that Dr Bobby Rumpf wants her to have IV iron again. The schedulers will call her to set up appt.

## 2021-08-05 ENCOUNTER — Encounter: Payer: Self-pay | Admitting: Oncology

## 2021-08-06 ENCOUNTER — Other Ambulatory Visit: Payer: Self-pay

## 2021-08-06 ENCOUNTER — Other Ambulatory Visit: Payer: Self-pay | Admitting: Pharmacist

## 2021-08-06 ENCOUNTER — Encounter: Payer: Self-pay | Admitting: Oncology

## 2021-08-06 ENCOUNTER — Inpatient Hospital Stay: Payer: Medicare Other

## 2021-08-06 ENCOUNTER — Ambulatory Visit (HOSPITAL_COMMUNITY): Payer: Medicare Other | Admitting: Clinical

## 2021-08-06 ENCOUNTER — Other Ambulatory Visit: Payer: Self-pay | Admitting: Hematology and Oncology

## 2021-08-06 VITALS — BP 135/67 | HR 63 | Temp 98.1°F | Resp 18 | Ht 61.0 in | Wt 342.8 lb

## 2021-08-06 DIAGNOSIS — D500 Iron deficiency anemia secondary to blood loss (chronic): Secondary | ICD-10-CM | POA: Diagnosis not present

## 2021-08-06 DIAGNOSIS — Z79899 Other long term (current) drug therapy: Secondary | ICD-10-CM | POA: Diagnosis not present

## 2021-08-06 MED ORDER — SODIUM CHLORIDE 0.9% FLUSH: 10.0000 mL | Freq: Once | INTRAVENOUS | Status: DC | PRN

## 2021-08-06 MED ORDER — SODIUM CHLORIDE 0.9% FLUSH: 10.0000 mL | INTRAVENOUS | Status: DC | PRN

## 2021-08-06 MED ORDER — SODIUM CHLORIDE 0.9 % IV SOLN: Freq: Once | INTRAVENOUS | Status: DC

## 2021-08-06 MED ORDER — SODIUM CHLORIDE 0.9% FLUSH: 3.0000 mL | Freq: Once | INTRAVENOUS | Status: DC | PRN

## 2021-08-06 MED ORDER — ALTEPLASE 2 MG IJ SOLR
2.0000 mg | Freq: Once | INTRAMUSCULAR | Status: AC | PRN
  Administered 2021-08-06: 2 mg

## 2021-08-06 MED ORDER — HEPARIN SOD (PORK) LOCK FLUSH 100 UNIT/ML IV SOLN: 500.0000 [IU] | Freq: Once | INTRAVENOUS | Status: DC | PRN

## 2021-08-06 MED ORDER — SODIUM CHLORIDE 0.9 % IV SOLN
510.0000 mg | Freq: Once | INTRAVENOUS | Status: DC
  Filled 2021-08-06: qty 17

## 2021-08-06 MED ORDER — HEPARIN SOD (PORK) LOCK FLUSH 100 UNIT/ML IV SOLN
500.0000 [IU] | Freq: Once | INTRAVENOUS | Status: AC | PRN
  Administered 2021-08-06: 500 [IU]

## 2021-08-06 MED ORDER — ALTEPLASE 2 MG IJ SOLR
2.0000 mg | Freq: Once | INTRAMUSCULAR | Status: AC | PRN
  Administered 2021-08-06: 2 mg
  Filled 2021-08-06: qty 2

## 2021-08-06 MED ORDER — ALTEPLASE 2 MG IJ SOLR
2.0000 mg | Freq: Once | INTRAMUSCULAR | Status: DC | PRN
  Filled 2021-08-06: qty 2

## 2021-08-06 MED ORDER — HEPARIN SOD (PORK) LOCK FLUSH 100 UNIT/ML IV SOLN
250.0000 [IU] | Freq: Once | INTRAVENOUS | Status: AC | PRN
  Administered 2021-08-06: 250 [IU]

## 2021-08-06 NOTE — Patient Instructions (Signed)
Ferumoxytol injection What is this medication? FERUMOXYTOL is an iron complex. Iron is used to make healthy red blood cells, which carry oxygen and nutrients throughout the body. This medicine is used totreat iron deficiency anemia. This medicine may be used for other purposes; ask your health care provider orpharmacist if you have questions. COMMON BRAND NAME(S): Feraheme What should I tell my care team before I take this medication? They need to know if you have any of these conditions: anemia not caused by low iron levels high levels of iron in the blood magnetic resonance imaging (MRI) test scheduled an unusual or allergic reaction to iron, other medicines, foods, dyes, or preservatives pregnant or trying to get pregnant breast-feeding How should I use this medication? This medicine is for injection into a vein. It is given by a health careprofessional in a hospital or clinic setting. Talk to your pediatrician regarding the use of this medicine in children.Special care may be needed. Overdosage: If you think you have taken too much of this medicine contact apoison control center or emergency room at once. NOTE: This medicine is only for you. Do not share this medicine with others. What if I miss a dose? It is important not to miss your dose. Call your doctor or health careprofessional if you are unable to keep an appointment. What may interact with this medication? This medicine may interact with the following medications: other iron products This list may not describe all possible interactions. Give your health care provider a list of all the medicines, herbs, non-prescription drugs, or dietary supplements you use. Also tell them if you smoke, drink alcohol, or use illegaldrugs. Some items may interact with your medicine. What should I watch for while using this medication? Visit your doctor or healthcare professional regularly. Tell your doctor or healthcare professional if your  symptoms do not start to get better or if theyget worse. You may need blood work done while you are taking this medicine. You may need to follow a special diet. Talk to your doctor. Foods that contain iron include: whole grains/cereals, dried fruits, beans, or peas, leafy greenvegetables, and organ meats (liver, kidney). What side effects may I notice from receiving this medication? Side effects that you should report to your doctor or health care professionalas soon as possible: allergic reactions like skin rash, itching or hives, swelling of the face, lips, or tongue breathing problems changes in blood pressure feeling faint or lightheaded, falls fever or chills flushing, sweating, or hot feelings swelling of the ankles or feet Side effects that usually do not require medical attention (report to yourdoctor or health care professional if they continue or are bothersome): diarrhea headache nausea, vomiting stomach pain This list may not describe all possible side effects. Call your doctor for medical advice about side effects. You may report side effects to FDA at1-800-FDA-1088. Where should I keep my medication? This drug is given in a hospital or clinic and will not be stored at home. NOTE: This sheet is a summary. It may not cover all possible information. If you have questions about this medicine, talk to your doctor, pharmacist, orhealth care provider.  2022 Elsevier/Gold Standard (2017-01-24 20:21:10)  

## 2021-08-06 NOTE — Progress Notes (Signed)
1354:  Unable to obtain IV access. LPAC without blood return. Cathflo x 2 with no blood. Called for Dye study orders received. PT STABLE AT TIME OF DISCHARGE

## 2021-08-07 ENCOUNTER — Encounter: Payer: Self-pay | Admitting: Oncology

## 2021-08-10 ENCOUNTER — Encounter: Payer: Self-pay | Admitting: *Deleted

## 2021-08-10 ENCOUNTER — Encounter: Payer: Self-pay | Admitting: Cardiology

## 2021-08-10 ENCOUNTER — Ambulatory Visit: Payer: Self-pay

## 2021-08-10 ENCOUNTER — Other Ambulatory Visit: Payer: Self-pay

## 2021-08-10 ENCOUNTER — Inpatient Hospital Stay: Payer: Medicare Other

## 2021-08-10 ENCOUNTER — Other Ambulatory Visit: Payer: Self-pay | Admitting: Pharmacist

## 2021-08-10 VITALS — BP 148/81 | HR 88 | Temp 97.4°F | Resp 18 | Ht 61.0 in | Wt 333.2 lb

## 2021-08-10 DIAGNOSIS — Z452 Encounter for adjustment and management of vascular access device: Secondary | ICD-10-CM | POA: Diagnosis not present

## 2021-08-10 DIAGNOSIS — Z79899 Other long term (current) drug therapy: Secondary | ICD-10-CM | POA: Diagnosis not present

## 2021-08-10 DIAGNOSIS — D500 Iron deficiency anemia secondary to blood loss (chronic): Secondary | ICD-10-CM

## 2021-08-10 DIAGNOSIS — I82B22 Chronic embolism and thrombosis of left subclavian vein: Secondary | ICD-10-CM | POA: Diagnosis not present

## 2021-08-10 MED ORDER — SODIUM CHLORIDE 0.9 % IV SOLN: Freq: Once | INTRAVENOUS | Status: AC

## 2021-08-10 MED ORDER — SODIUM CHLORIDE 0.9 % IV SOLN
510.0000 mg | Freq: Once | INTRAVENOUS | Status: AC
  Administered 2021-08-10: 510 mg via INTRAVENOUS
  Filled 2021-08-10: qty 510

## 2021-08-10 MED ORDER — SODIUM CHLORIDE 0.9% FLUSH
10.0000 mL | Freq: Once | INTRAVENOUS | Status: AC | PRN
  Administered 2021-08-10: 10 mL

## 2021-08-10 MED ORDER — HEPARIN SOD (PORK) LOCK FLUSH 100 UNIT/ML IV SOLN
250.0000 [IU] | Freq: Once | INTRAVENOUS | Status: AC | PRN
  Administered 2021-08-10: 250 [IU]

## 2021-08-10 NOTE — Progress Notes (Signed)
1312: PT STABLE AT TIME OF DISCHARGE

## 2021-08-10 NOTE — Patient Instructions (Signed)
Ferumoxytol injection What is this medication? FERUMOXYTOL is an iron complex. Iron is used to make healthy red blood cells, which carry oxygen and nutrients throughout the body. This medicine is used totreat iron deficiency anemia. This medicine may be used for other purposes; ask your health care provider orpharmacist if you have questions. COMMON BRAND NAME(S): Feraheme What should I tell my care team before I take this medication? They need to know if you have any of these conditions: anemia not caused by low iron levels high levels of iron in the blood magnetic resonance imaging (MRI) test scheduled an unusual or allergic reaction to iron, other medicines, foods, dyes, or preservatives pregnant or trying to get pregnant breast-feeding How should I use this medication? This medicine is for injection into a vein. It is given by a health careprofessional in a hospital or clinic setting. Talk to your pediatrician regarding the use of this medicine in children.Special care may be needed. Overdosage: If you think you have taken too much of this medicine contact apoison control center or emergency room at once. NOTE: This medicine is only for you. Do not share this medicine with others. What if I miss a dose? It is important not to miss your dose. Call your doctor or health careprofessional if you are unable to keep an appointment. What may interact with this medication? This medicine may interact with the following medications: other iron products This list may not describe all possible interactions. Give your health care provider a list of all the medicines, herbs, non-prescription drugs, or dietary supplements you use. Also tell them if you smoke, drink alcohol, or use illegaldrugs. Some items may interact with your medicine. What should I watch for while using this medication? Visit your doctor or healthcare professional regularly. Tell your doctor or healthcare professional if your  symptoms do not start to get better or if theyget worse. You may need blood work done while you are taking this medicine. You may need to follow a special diet. Talk to your doctor. Foods that contain iron include: whole grains/cereals, dried fruits, beans, or peas, leafy greenvegetables, and organ meats (liver, kidney). What side effects may I notice from receiving this medication? Side effects that you should report to your doctor or health care professionalas soon as possible: allergic reactions like skin rash, itching or hives, swelling of the face, lips, or tongue breathing problems changes in blood pressure feeling faint or lightheaded, falls fever or chills flushing, sweating, or hot feelings swelling of the ankles or feet Side effects that usually do not require medical attention (report to yourdoctor or health care professional if they continue or are bothersome): diarrhea headache nausea, vomiting stomach pain This list may not describe all possible side effects. Call your doctor for medical advice about side effects. You may report side effects to FDA at1-800-FDA-1088. Where should I keep my medication? This drug is given in a hospital or clinic and will not be stored at home. NOTE: This sheet is a summary. It may not cover all possible information. If you have questions about this medicine, talk to your doctor, pharmacist, orhealth care provider.  2022 Elsevier/Gold Standard (2017-01-24 20:21:10)  

## 2021-08-11 ENCOUNTER — Ambulatory Visit: Payer: Self-pay

## 2021-08-12 ENCOUNTER — Telehealth: Payer: Self-pay

## 2021-08-12 ENCOUNTER — Other Ambulatory Visit: Payer: Self-pay

## 2021-08-12 ENCOUNTER — Other Ambulatory Visit: Payer: Self-pay | Admitting: Diagnostic Radiology

## 2021-08-12 ENCOUNTER — Encounter: Payer: Self-pay | Admitting: Oncology

## 2021-08-12 DIAGNOSIS — I871 Compression of vein: Secondary | ICD-10-CM

## 2021-08-12 MED ORDER — PREDNISONE 50 MG PO TABS: ORAL_TABLET | ORAL | 0 refills | Status: DC

## 2021-08-12 NOTE — Addendum Note (Signed)
Addended by: Juanetta Beets on: 08/12/2021 09:09 AM   Modules accepted: Orders

## 2021-08-12 NOTE — Telephone Encounter (Addendum)
Phone call to patient to review instructions for 13 hr prep for CT w/ contrast on 08/21/21 at 10:00AM at [facility]. Prescription called into [facility]. Spoke with patients mother Patricia/primary caretaker, aware and verbalized understanding of instructions. Will use home supply of Benadryl for 13HR PREP.    Prescription: Pt to take 50 mg of prednisone on 08/20/21 at 9:00PM, 50 mg of prednisone on 08/21/21 at 03:00AM, and 50 mg of prednisone on 08/21/21 at 09:00AM. Pt is also to take 50 mg of benadryl on 08/21/21 at 09:00AM. Please call [phone] with any questions.

## 2021-08-13 ENCOUNTER — Ambulatory Visit: Payer: Self-pay

## 2021-08-13 ENCOUNTER — Ambulatory Visit (INDEPENDENT_AMBULATORY_CARE_PROVIDER_SITE_OTHER): Payer: Medicare Other | Admitting: Clinical

## 2021-08-13 ENCOUNTER — Other Ambulatory Visit (HOSPITAL_COMMUNITY): Payer: Self-pay | Admitting: *Deleted

## 2021-08-13 ENCOUNTER — Encounter: Payer: Self-pay | Admitting: Oncology

## 2021-08-13 ENCOUNTER — Inpatient Hospital Stay: Payer: Medicare Other

## 2021-08-13 ENCOUNTER — Telehealth (HOSPITAL_COMMUNITY): Payer: Self-pay | Admitting: *Deleted

## 2021-08-13 ENCOUNTER — Other Ambulatory Visit: Payer: Self-pay

## 2021-08-13 VITALS — BP 123/70 | HR 68 | Temp 98.7°F | Resp 18 | Ht 61.0 in | Wt 331.2 lb

## 2021-08-13 DIAGNOSIS — F4310 Post-traumatic stress disorder, unspecified: Secondary | ICD-10-CM

## 2021-08-13 DIAGNOSIS — D500 Iron deficiency anemia secondary to blood loss (chronic): Secondary | ICD-10-CM | POA: Diagnosis not present

## 2021-08-13 DIAGNOSIS — F419 Anxiety disorder, unspecified: Secondary | ICD-10-CM

## 2021-08-13 DIAGNOSIS — F32A Depression, unspecified: Secondary | ICD-10-CM | POA: Diagnosis not present

## 2021-08-13 DIAGNOSIS — Z79899 Other long term (current) drug therapy: Secondary | ICD-10-CM | POA: Diagnosis not present

## 2021-08-13 MED ORDER — HEPARIN SOD (PORK) LOCK FLUSH 100 UNIT/ML IV SOLN
250.0000 [IU] | Freq: Once | INTRAVENOUS | Status: AC | PRN
  Administered 2021-08-13: 250 [IU]

## 2021-08-13 MED ORDER — SODIUM CHLORIDE 0.9% FLUSH: 3.0000 mL | Freq: Once | INTRAVENOUS | Status: DC | PRN

## 2021-08-13 MED ORDER — TRAZODONE HCL 100 MG PO TABS: 50.0000 mg | ORAL_TABLET | Freq: Every day | ORAL | 0 refills | Status: DC

## 2021-08-13 MED ORDER — SODIUM CHLORIDE 0.9% FLUSH
10.0000 mL | Freq: Once | INTRAVENOUS | Status: AC | PRN
  Administered 2021-08-13: 10 mL

## 2021-08-13 MED ORDER — SODIUM CHLORIDE 0.9 % IV SOLN
510.0000 mg | Freq: Once | INTRAVENOUS | Status: AC
  Administered 2021-08-13: 510 mg via INTRAVENOUS
  Filled 2021-08-13: qty 510

## 2021-08-13 MED ORDER — HEPARIN SOD (PORK) LOCK FLUSH 100 UNIT/ML IV SOLN: 250.0000 [IU] | Freq: Once | INTRAVENOUS | Status: DC

## 2021-08-13 MED ORDER — ALTEPLASE 2 MG IJ SOLR
INTRAMUSCULAR | Status: AC
  Administered 2021-08-13: 2 mg
  Filled 2021-08-13: qty 2

## 2021-08-13 MED ORDER — ALTEPLASE 2 MG IJ SOLR: 2.0000 mg | Freq: Once | INTRAMUSCULAR | Status: DC | PRN

## 2021-08-13 MED ORDER — SODIUM CHLORIDE 0.9 % IV SOLN: Freq: Once | INTRAVENOUS | Status: AC

## 2021-08-13 MED ORDER — HEPARIN SOD (PORK) LOCK FLUSH 100 UNIT/ML IV SOLN: 500.0000 [IU] | Freq: Once | INTRAVENOUS | Status: DC | PRN

## 2021-08-13 NOTE — Progress Notes (Signed)
   THERAPIST PROGRESS NOTE  Session Time: 2pm  Participation Level: Active  Behavioral Response: NAAlert"Blah"  Type of Therapy: Individual Therapy  Treatment Goals addressed: Coping  Interventions: Supportive Virtual Visit via Telephone Note  I connected with Jane Edwards on 08/13/21 at  2:00 PM EDT by telephone and verified that I am speaking with the correct person using two identifiers.  Location: Patient: home Provider: office   I discussed the limitations, risks, security and privacy concerns of performing an evaluation and management service by telephone and the availability of in person appointments. I also discussed with the patient that there may be a patient responsible charge related to this service. The patient expressed understanding and agreed to proceed.   I discussed the assessment and treatment plan with the patient. The patient was provided an opportunity to ask questions and all were answered. The patient agreed with the plan and demonstrated an understanding of the instructions.   The patient was advised to call back or seek an in-person evaluation if the symptoms worsen or if the condition fails to improve as anticipated.  I provided 40 minutes of non-face-to-face time during this encounter.  Summary: Jane Edwards is a 45 y.o. female who describes her mood as "blah" today. Pt reports having physical health problems that impact the way she feels. Nevertheless, pt states having a strong support system that play significant role in her well being. Pt identifies her mother, family members and nursing staff who care for her as protective factors. Pt states her mother offers a great deal of support and uplifts her when she experiences low mood. Pt discussed having panic attacks when she is away from her support system.   Suicidal/Homicidal:Pt denies SI/HI no plan, intent or attempt to harm self or others reported     Therapist Response: CSW actively listened  as pt discussed protective factors what she is most grateful for. CSW discussed with pt creating a gratitude journal to reflect upon 1-3 things she is most grateful for each day. Pt says she is agreeable to do so and will be further discussed next session. CSW provided pt with grounding techniques to assist with managing anxiety inducing situations.   Plan: Return again in 2 weeks.  Diagnosis: Axis I: PTSD                                     Anxiety and depresion    Axis II: No diagnosis    Yvette Rack, LCSW 08/13/2021

## 2021-08-13 NOTE — Telephone Encounter (Signed)
She is more than 30 early. No early refills.

## 2021-08-13 NOTE — Patient Instructions (Signed)
Ferumoxytol injection What is this medication? FERUMOXYTOL is an iron complex. Iron is used to make healthy red blood cells, which carry oxygen and nutrients throughout the body. This medicine is used totreat iron deficiency anemia. This medicine may be used for other purposes; ask your health care provider orpharmacist if you have questions. COMMON BRAND NAME(S): Feraheme What should I tell my care team before I take this medication? They need to know if you have any of these conditions: anemia not caused by low iron levels high levels of iron in the blood magnetic resonance imaging (MRI) test scheduled an unusual or allergic reaction to iron, other medicines, foods, dyes, or preservatives pregnant or trying to get pregnant breast-feeding How should I use this medication? This medicine is for injection into a vein. It is given by a health careprofessional in a hospital or clinic setting. Talk to your pediatrician regarding the use of this medicine in children.Special care may be needed. Overdosage: If you think you have taken too much of this medicine contact apoison control center or emergency room at once. NOTE: This medicine is only for you. Do not share this medicine with others. What if I miss a dose? It is important not to miss your dose. Call your doctor or health careprofessional if you are unable to keep an appointment. What may interact with this medication? This medicine may interact with the following medications: other iron products This list may not describe all possible interactions. Give your health care provider a list of all the medicines, herbs, non-prescription drugs, or dietary supplements you use. Also tell them if you smoke, drink alcohol, or use illegaldrugs. Some items may interact with your medicine. What should I watch for while using this medication? Visit your doctor or healthcare professional regularly. Tell your doctor or healthcare professional if your  symptoms do not start to get better or if theyget worse. You may need blood work done while you are taking this medicine. You may need to follow a special diet. Talk to your doctor. Foods that contain iron include: whole grains/cereals, dried fruits, beans, or peas, leafy greenvegetables, and organ meats (liver, kidney). What side effects may I notice from receiving this medication? Side effects that you should report to your doctor or health care professionalas soon as possible: allergic reactions like skin rash, itching or hives, swelling of the face, lips, or tongue breathing problems changes in blood pressure feeling faint or lightheaded, falls fever or chills flushing, sweating, or hot feelings swelling of the ankles or feet Side effects that usually do not require medical attention (report to yourdoctor or health care professional if they continue or are bothersome): diarrhea headache nausea, vomiting stomach pain This list may not describe all possible side effects. Call your doctor for medical advice about side effects. You may report side effects to FDA at1-800-FDA-1088. Where should I keep my medication? This drug is given in a hospital or clinic and will not be stored at home. NOTE: This sheet is a summary. It may not cover all possible information. If you have questions about this medicine, talk to your doctor, pharmacist, orhealth care provider.  2022 Elsevier/Gold Standard (2017-01-24 20:21:10)  

## 2021-08-13 NOTE — Telephone Encounter (Signed)
Pt mail order pharmacy, [place], called requesting refill of Trazodone 100 mg 0.5 tablet to a whole qhs. Pt has an appointment 09/17/21. Pt should have enough to last until next appointment but as is mail order we would need to send refill now. Ok? Last visit note states pt sleeping well with medication. Thanks.

## 2021-08-13 NOTE — Progress Notes (Signed)
1315: [place] line unable to obtain blood return. 1328: Cathflo to PICC to dwell per standing orders verified by IR @ [place] (recently placed there)Schedule to revise [facility] and remove [place] then if successful access. 1422: 6cc blood and cathflo removed from [facility] line.Started Feraheme per orders. 1531: PT STABLE AT TIME OF DISCHARGE

## 2021-08-19 ENCOUNTER — Encounter: Payer: Self-pay | Admitting: Oncology

## 2021-08-21 ENCOUNTER — Other Ambulatory Visit: Payer: Self-pay

## 2021-08-21 ENCOUNTER — Ambulatory Visit (HOSPITAL_COMMUNITY)
Admission: RE | Admit: 2021-08-21 | Discharge: 2021-08-21 | Disposition: A | Discharge status: Home / Self Care | Payer: Medicare Other | Source: Ambulatory Visit | Attending: Diagnostic Radiology | Admitting: Diagnostic Radiology

## 2021-08-21 DIAGNOSIS — I871 Compression of vein: Secondary | ICD-10-CM | POA: Insufficient documentation

## 2021-08-21 DIAGNOSIS — I82290 Acute embolism and thrombosis of other thoracic veins: Secondary | ICD-10-CM | POA: Diagnosis not present

## 2021-08-21 DIAGNOSIS — I82210 Acute embolism and thrombosis of superior vena cava: Secondary | ICD-10-CM | POA: Diagnosis not present

## 2021-08-21 DIAGNOSIS — Z452 Encounter for adjustment and management of vascular access device: Secondary | ICD-10-CM | POA: Diagnosis not present

## 2021-08-21 MED ORDER — IOHEXOL 350 MG/ML SOLN
100.0000 mL | Freq: Once | INTRAVENOUS | Status: AC | PRN
  Administered 2021-08-21: 100 mL via INTRAVENOUS

## 2021-08-27 ENCOUNTER — Other Ambulatory Visit: Payer: Self-pay

## 2021-08-27 ENCOUNTER — Inpatient Hospital Stay: Payer: Medicare Other | Attending: Oncology

## 2021-08-27 VITALS — BP 137/68 | HR 71 | Temp 98.4°F | Resp 18 | Ht 61.0 in | Wt 330.5 lb

## 2021-08-27 DIAGNOSIS — N920 Excessive and frequent menstruation with regular cycle: Secondary | ICD-10-CM | POA: Insufficient documentation

## 2021-08-27 DIAGNOSIS — Z452 Encounter for adjustment and management of vascular access device: Secondary | ICD-10-CM | POA: Insufficient documentation

## 2021-08-27 DIAGNOSIS — D500 Iron deficiency anemia secondary to blood loss (chronic): Secondary | ICD-10-CM | POA: Diagnosis not present

## 2021-08-27 MED ORDER — HEPARIN SOD (PORK) LOCK FLUSH 100 UNIT/ML IV SOLN
250.0000 [IU] | Freq: Once | INTRAVENOUS | Status: AC
  Administered 2021-08-27: 250 [IU]

## 2021-08-27 MED ORDER — SODIUM CHLORIDE 0.9% FLUSH
10.0000 mL | Freq: Once | INTRAVENOUS | Status: AC
  Administered 2021-08-27: 10 mL

## 2021-08-27 NOTE — Patient Instructions (Signed)
Heparin injection What is this medication? HEPARIN (HEP a rin) is an anticoagulant. It is used to treat or prevent clots in the veins, arteries, lungs, or heart. It stops clots from forming or getting bigger. This medicine prevents clotting during open-heart surgery, dialysis, or in patients who are confined to bed. This medicine may be used for other purposes; ask your health care provider or pharmacist if you have questions. COMMON BRAND NAME(S): Hep-Lock, Hep-Lock U/P, Hepflush-10, Monoject Prefill Advanced Heparin Lock Flush, SASH Normal Saline and Heparin What should I tell my care team before I take this medication? They need to know if you have any of these conditions: bleeding disorders, such as hemophilia or low blood platelets bowel disease or diverticulitis endocarditis high blood pressure liver disease recent surgery or delivery of a baby stomach ulcers an unusual or allergic reaction to heparin, benzyl alcohol, sulfites, other medicines, foods, dyes, or preservatives pregnant or trying to get pregnant breast-feeding How should I use this medication? This medicine is given by injection or infusion into a vein. It can also be given by injection of small amounts under the skin. It is usually given by a health care professional in a hospital or clinic setting. If you get this medicine at home, you will be taught how to prepare and give this medicine. Use exactly as directed. Take your medicine at regular intervals. Do not take it more often than directed. Do not stop taking except on your doctor's advice. Stopping this medicine may increase your risk of a blot clot. Be sure to refill your prescription before you run out of medicine. It is important that you put your used needles and syringes in a special sharps container. Do not put them in a trash can. If you do not have a sharps container, call your pharmacist or healthcare provider to get one. Talk to your pediatrician regarding the  use of this medicine in children. While this medicine may be prescribed for children for selected conditions, precautions do apply. Overdosage: If you think you have taken too much of this medicine contact a poison control center or emergency room at once. NOTE: This medicine is only for you. Do not share this medicine with others. What if I miss a dose? If you miss a dose, take it as soon as you can. If it is almost time for your next dose, take only that dose. Do not take double or extra doses. What may interact with this medication? Do not take this medicine with any of the following medications: aspirin and aspirin-like drugs mifepristone medicines that treat or prevent blood clots like warfarin, enoxaparin, and dalteparin palifermin protamine This medicine may also interact with the following medications: dextran digoxin hydroxychloroquine medicines for treating colds or allergies nicotine NSAIDs, medicines for pain and inflammation, like ibuprofen or naproxen phenylbutazone tetracycline antibiotics This list may not describe all possible interactions. Give your health care provider a list of all the medicines, herbs, non-prescription drugs, or dietary supplements you use. Also tell them if you smoke, drink alcohol, or use illegal drugs. Some items may interact with your medicine. What should I watch for while using this medication? Visit your healthcare professional for regular checks on your progress. You may need blood work done while you are taking this medicine. Your condition will be monitored carefully while you are receiving this medicine. It is important not to miss any appointments. Wear a medical ID bracelet or chain, and carry a card that describes your disease and details   of your medicine and dosage times. Notify your doctor or healthcare professional at once if you have cold, blue hands or feet. If you are going to need surgery or other procedure, tell your healthcare  professional that you are using this medicine. Avoid sports and activities that might cause injury while you are using this medicine. Severe falls or injuries can cause unseen bleeding. Be careful when using sharp tools or knives. Consider using an Copy. Take special care brushing or flossing your teeth. Report any injuries, bruising, or red spots on the skin to your healthcare professional. Using this medicine for a long time may weaken your bones and increase the risk of bone fractures. You should make sure that you get enough calcium and vitamin D while you are taking this medicine. Discuss the foods you eat and the vitamins you take with your healthcare professional. Wear a medical ID bracelet or chain. Carry a card that describes your disease and details of your medicine and dosage times. What side effects may I notice from receiving this medication? Side effects that you should report to your doctor or health care professional as soon as possible: allergic reactions like skin rash, itching or hives, swelling of the face, lips, or tongue bone pain fever, chills nausea, vomiting signs and symptoms of bleeding such as bloody or black, tarry stools; red or dark-brown urine; spitting up blood or brown material that looks like coffee grounds; red spots on the skin; unusual bruising or bleeding from the eye, gums, or nose signs and symptoms of a blood clot such as chest pain; shortness of breath; pain, swelling, or warmth in the leg signs and symptoms of a stroke such as changes in vision; confusion; trouble speaking or understanding; severe headaches; sudden numbness or weakness of the face, arm or leg; trouble walking; dizziness; loss of coordination Side effects that usually do not require medical attention (report to your doctor or health care professional if they continue or are bothersome): hair loss pain, redness, or irritation at site where injected This list may not describe all  possible side effects. Call your doctor for medical advice about side effects. You may report side effects to FDA at 1-800-FDA-1088. Where should I keep my medication? Keep out of the reach of children. Store unopened vials at room temperature between 15 and 30 degrees C (59 and 86 degrees F). Do not freeze. Do not use if solution is discolored or particulate matter is present. Throw away any unused medicine after the expiration date. NOTE: This sheet is a summary. It may not cover all possible information. If you have questions about this medicine, talk to your doctor, pharmacist, or health care provider.  2022 Elsevier/Gold Standard (2021-01-14 11:02:57)

## 2021-08-27 NOTE — Progress Notes (Signed)
1415: PT STABLE AT TIME OF DISCHARGE

## 2021-08-29 ENCOUNTER — Encounter: Payer: Self-pay | Admitting: Oncology

## 2021-08-31 ENCOUNTER — Other Ambulatory Visit: Payer: Self-pay

## 2021-08-31 DIAGNOSIS — N920 Excessive and frequent menstruation with regular cycle: Secondary | ICD-10-CM | POA: Insufficient documentation

## 2021-08-31 DIAGNOSIS — E559 Vitamin D deficiency, unspecified: Secondary | ICD-10-CM | POA: Insufficient documentation

## 2021-08-31 DIAGNOSIS — J45909 Unspecified asthma, uncomplicated: Secondary | ICD-10-CM | POA: Insufficient documentation

## 2021-08-31 DIAGNOSIS — R609 Edema, unspecified: Secondary | ICD-10-CM | POA: Insufficient documentation

## 2021-08-31 DIAGNOSIS — R5381 Other malaise: Secondary | ICD-10-CM | POA: Insufficient documentation

## 2021-08-31 DIAGNOSIS — F418 Other specified anxiety disorders: Secondary | ICD-10-CM | POA: Insufficient documentation

## 2021-08-31 DIAGNOSIS — D219 Benign neoplasm of connective and other soft tissue, unspecified: Secondary | ICD-10-CM | POA: Insufficient documentation

## 2021-08-31 DIAGNOSIS — D649 Anemia, unspecified: Secondary | ICD-10-CM | POA: Insufficient documentation

## 2021-08-31 DIAGNOSIS — E876 Hypokalemia: Secondary | ICD-10-CM | POA: Insufficient documentation

## 2021-08-31 DIAGNOSIS — R5382 Chronic fatigue, unspecified: Secondary | ICD-10-CM | POA: Insufficient documentation

## 2021-08-31 DIAGNOSIS — E1159 Type 2 diabetes mellitus with other circulatory complications: Secondary | ICD-10-CM | POA: Insufficient documentation

## 2021-08-31 DIAGNOSIS — I10 Essential (primary) hypertension: Secondary | ICD-10-CM | POA: Insufficient documentation

## 2021-09-01 ENCOUNTER — Other Ambulatory Visit: Payer: Self-pay

## 2021-09-01 DIAGNOSIS — E7800 Pure hypercholesterolemia, unspecified: Secondary | ICD-10-CM | POA: Insufficient documentation

## 2021-09-02 ENCOUNTER — Encounter: Payer: Self-pay | Admitting: Cardiology

## 2021-09-02 ENCOUNTER — Encounter: Payer: Self-pay | Admitting: Oncology

## 2021-09-02 ENCOUNTER — Ambulatory Visit (INDEPENDENT_AMBULATORY_CARE_PROVIDER_SITE_OTHER): Payer: Medicare Other | Admitting: Cardiology

## 2021-09-02 ENCOUNTER — Other Ambulatory Visit: Payer: Self-pay

## 2021-09-02 VITALS — BP 100/68 | HR 64 | Ht 61.6 in | Wt 325.6 lb

## 2021-09-02 DIAGNOSIS — G4733 Obstructive sleep apnea (adult) (pediatric): Secondary | ICD-10-CM

## 2021-09-02 DIAGNOSIS — E7800 Pure hypercholesterolemia, unspecified: Secondary | ICD-10-CM

## 2021-09-02 DIAGNOSIS — Z6841 Body Mass Index (BMI) 40.0 and over, adult: Secondary | ICD-10-CM

## 2021-09-02 DIAGNOSIS — E662 Morbid (severe) obesity with alveolar hypoventilation: Secondary | ICD-10-CM

## 2021-09-02 DIAGNOSIS — Z9989 Dependence on other enabling machines and devices: Secondary | ICD-10-CM | POA: Diagnosis not present

## 2021-09-02 DIAGNOSIS — R011 Cardiac murmur, unspecified: Secondary | ICD-10-CM

## 2021-09-02 DIAGNOSIS — I10 Essential (primary) hypertension: Secondary | ICD-10-CM | POA: Diagnosis not present

## 2021-09-02 NOTE — Patient Instructions (Signed)
Medication Instructions:  No medication changes. *If you need a refill on your cardiac medications before your next appointment, please call your pharmacy*   Lab Work: None ordered If you have labs (blood work) drawn today and your tests are completely normal, you will receive your results only by: [place] (if you have MyChart) OR A paper copy in the mail If you have any lab test that is abnormal or we need to change your treatment, we will call you to review the results.   Testing/Procedures: Your physician has requested that you have an echocardiogram. Echocardiography is a painless test that uses sound waves to create images of your heart. It provides your doctor with information about the size and shape of your heart and how well your heart's chambers and valves are working. This procedure takes approximately one hour. There are no restrictions for this procedure.    Follow-Up: At [facility], you and your health needs are our priority.  As part of our continuing mission to provide you with exceptional heart care, we have created designated Provider Care Teams.  These Care Teams include your primary Cardiologist (physician) and Advanced Practice Providers (APPs -  Physician Assistants and Nurse Practitioners) who all work together to provide you with the care you need, when you need it.  We recommend signing up for the patient portal called "MyChart".  Sign up information is provided on this After Visit Summary.  MyChart is used to connect with patients for Virtual Visits (Telemedicine).  Patients are able to view lab/test results, encounter notes, upcoming appointments, etc.  Non-urgent messages can be sent to your provider as well.   To learn more about what you can do with MyChart, go to [email or web address].    Your next appointment:   6 month(s)  The format for your next appointment:   In Person  Provider:   Jyl Heinz, MD   Other Instructions
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
## 2021-09-03 ENCOUNTER — Encounter: Payer: Self-pay | Admitting: Oncology

## 2021-09-03 ENCOUNTER — Emergency Department (HOSPITAL_COMMUNITY): Payer: Medicare Other

## 2021-09-03 ENCOUNTER — Inpatient Hospital Stay (HOSPITAL_COMMUNITY)
Admission: EM | Admit: 2021-09-03 | Discharge: 2021-09-07 | DRG: 315 | Disposition: A | Discharge status: Home / Self Care | Payer: Medicare Other | Attending: Internal Medicine | Admitting: Internal Medicine

## 2021-09-03 ENCOUNTER — Inpatient Hospital Stay: Payer: Medicare Other

## 2021-09-03 ENCOUNTER — Other Ambulatory Visit: Payer: Self-pay

## 2021-09-03 VITALS — BP 114/59 | HR 62 | Temp 98.3°F | Resp 18

## 2021-09-03 DIAGNOSIS — G4730 Sleep apnea, unspecified: Secondary | ICD-10-CM | POA: Diagnosis not present

## 2021-09-03 DIAGNOSIS — H9311 Tinnitus, right ear: Secondary | ICD-10-CM | POA: Diagnosis present

## 2021-09-03 DIAGNOSIS — Z95828 Presence of other vascular implants and grafts: Secondary | ICD-10-CM | POA: Diagnosis not present

## 2021-09-03 DIAGNOSIS — T82868A Thrombosis of vascular prosthetic devices, implants and grafts, initial encounter: Secondary | ICD-10-CM | POA: Diagnosis present

## 2021-09-03 DIAGNOSIS — D500 Iron deficiency anemia secondary to blood loss (chronic): Secondary | ICD-10-CM

## 2021-09-03 DIAGNOSIS — Z743 Need for continuous supervision: Secondary | ICD-10-CM | POA: Diagnosis not present

## 2021-09-03 DIAGNOSIS — I82210 Acute embolism and thrombosis of superior vena cava: Secondary | ICD-10-CM | POA: Diagnosis not present

## 2021-09-03 DIAGNOSIS — Y712 Prosthetic and other implants, materials and accessory cardiovascular devices associated with adverse incidents: Secondary | ICD-10-CM | POA: Diagnosis present

## 2021-09-03 DIAGNOSIS — Z20822 Contact with and (suspected) exposure to covid-19: Secondary | ICD-10-CM | POA: Diagnosis present

## 2021-09-03 DIAGNOSIS — I5031 Acute diastolic (congestive) heart failure: Secondary | ICD-10-CM | POA: Diagnosis present

## 2021-09-03 DIAGNOSIS — G40822 Epileptic spasms, not intractable, without status epilepticus: Secondary | ICD-10-CM | POA: Diagnosis not present

## 2021-09-03 DIAGNOSIS — I10 Essential (primary) hypertension: Secondary | ICD-10-CM | POA: Diagnosis present

## 2021-09-03 DIAGNOSIS — R5382 Chronic fatigue, unspecified: Secondary | ICD-10-CM | POA: Diagnosis present

## 2021-09-03 DIAGNOSIS — Z6841 Body Mass Index (BMI) 40.0 and over, adult: Secondary | ICD-10-CM

## 2021-09-03 DIAGNOSIS — T82514A Breakdown (mechanical) of infusion catheter, initial encounter: Secondary | ICD-10-CM | POA: Diagnosis not present

## 2021-09-03 DIAGNOSIS — Z91041 Radiographic dye allergy status: Secondary | ICD-10-CM | POA: Diagnosis not present

## 2021-09-03 DIAGNOSIS — T82898A Other specified complication of vascular prosthetic devices, implants and grafts, initial encounter: Secondary | ICD-10-CM | POA: Diagnosis not present

## 2021-09-03 DIAGNOSIS — E1159 Type 2 diabetes mellitus with other circulatory complications: Secondary | ICD-10-CM | POA: Diagnosis not present

## 2021-09-03 DIAGNOSIS — I871 Compression of vein: Secondary | ICD-10-CM | POA: Diagnosis not present

## 2021-09-03 DIAGNOSIS — J45909 Unspecified asthma, uncomplicated: Secondary | ICD-10-CM | POA: Diagnosis present

## 2021-09-03 DIAGNOSIS — Z79899 Other long term (current) drug therapy: Secondary | ICD-10-CM

## 2021-09-03 DIAGNOSIS — I110 Hypertensive heart disease with heart failure: Secondary | ICD-10-CM | POA: Diagnosis not present

## 2021-09-03 DIAGNOSIS — R609 Edema, unspecified: Secondary | ICD-10-CM

## 2021-09-03 DIAGNOSIS — E7800 Pure hypercholesterolemia, unspecified: Secondary | ICD-10-CM | POA: Diagnosis present

## 2021-09-03 DIAGNOSIS — Z981 Arthrodesis status: Secondary | ICD-10-CM | POA: Diagnosis not present

## 2021-09-03 DIAGNOSIS — Z452 Encounter for adjustment and management of vascular access device: Secondary | ICD-10-CM | POA: Diagnosis not present

## 2021-09-03 DIAGNOSIS — F4310 Post-traumatic stress disorder, unspecified: Secondary | ICD-10-CM | POA: Diagnosis present

## 2021-09-03 DIAGNOSIS — G40309 Generalized idiopathic epilepsy and epileptic syndromes, not intractable, without status epilepticus: Secondary | ICD-10-CM | POA: Diagnosis present

## 2021-09-03 LAB — COMPREHENSIVE METABOLIC PANEL
ALT: 69 U/L — ABNORMAL HIGH (ref 0–44)
AST: 76 U/L — ABNORMAL HIGH (ref 15–41)
Albumin: 3.6 g/dL (ref 3.5–5.0)
Alkaline Phosphatase: 93 U/L (ref 38–126)
Anion gap: 12 (ref 5–15)
BUN: 6 mg/dL (ref 6–20)
CO2: 20 mmol/L — ABNORMAL LOW (ref 22–32)
Calcium: 8.7 mg/dL — ABNORMAL LOW (ref 8.9–10.3)
Chloride: 105 mmol/L (ref 98–111)
Creatinine, Ser: 1.12 mg/dL — ABNORMAL HIGH (ref 0.44–1.00)
GFR, Estimated: >60 mL/min (ref >60)
Glucose, Bld: 125 mg/dL — ABNORMAL HIGH (ref 70–99)
Potassium: 3.9 mmol/L (ref 3.5–5.1)
Sodium: 137 mmol/L (ref 135–145)
Total Bilirubin: 0.6 mg/dL (ref 0.3–1.2)
Total Protein: 6.7 g/dL (ref 6.5–8.1)

## 2021-09-03 LAB — CBC WITH DIFFERENTIAL/PLATELET
Abs Immature Granulocytes: 0.01 10*3/uL (ref 0.00–0.07)
Basophils Absolute: 0 10*3/uL (ref 0.0–0.1)
Basophils Relative: 0 %
Eosinophils Absolute: 0.1 10*3/uL (ref 0.0–0.5)
Eosinophils Relative: 2 %
HCT: 42.7 % (ref 36.0–46.0)
Hemoglobin: 12.7 g/dL (ref 12.0–15.0)
Immature Granulocytes: 0 %
Lymphocytes Relative: 14 %
Lymphs Abs: 0.8 10*3/uL (ref 0.7–4.0)
MCH: 26.6 pg (ref 26.0–34.0)
MCHC: 29.7 g/dL — ABNORMAL LOW (ref 30.0–36.0)
MCV: 89.3 fL (ref 80.0–100.0)
Monocytes Absolute: 0.3 10*3/uL (ref 0.1–1.0)
Monocytes Relative: 6 %
Neutro Abs: 4.6 10*3/uL (ref 1.7–7.7)
Neutrophils Relative %: 78 %
Platelets: 161 10*3/uL (ref 150–400)
RBC: 4.78 MIL/uL (ref 3.87–5.11)
RDW: 17.8 % — ABNORMAL HIGH (ref 11.5–15.5)
WBC: 5.9 10*3/uL (ref 4.0–10.5)
nRBC: 0 % (ref 0.0–0.2)

## 2021-09-03 LAB — CBG MONITORING, ED: Glucose-Capillary: 104 mg/dL — ABNORMAL HIGH (ref 70–99)

## 2021-09-03 LAB — BRAIN NATRIURETIC PEPTIDE: B Natriuretic Peptide: 14.9 pg/mL (ref 0.0–100.0)

## 2021-09-03 MED ORDER — DIPHENHYDRAMINE HCL 25 MG PO CAPS: 50.0000 mg | ORAL_CAPSULE | Freq: Once | ORAL | Status: DC

## 2021-09-03 MED ORDER — HEPARIN SOD (PORK) LOCK FLUSH 100 UNIT/ML IV SOLN: 250.0000 [IU] | Freq: Once | INTRAVENOUS | Status: DC

## 2021-09-03 MED ORDER — DIPHENHYDRAMINE HCL 50 MG/ML IJ SOLN: 50.0000 mg | Freq: Once | INTRAMUSCULAR | Status: DC

## 2021-09-03 MED ORDER — ALTEPLASE 2 MG IJ SOLR
2.0000 mg | Freq: Once | INTRAMUSCULAR | Status: AC
  Administered 2021-09-03: 2 mg
  Filled 2021-09-03: qty 2

## 2021-09-03 MED ORDER — HYDROCORTISONE SOD SUC (PF) 250 MG IJ SOLR
200.0000 mg | Freq: Once | INTRAMUSCULAR | Status: DC
  Filled 2021-09-03: qty 200

## 2021-09-03 MED ORDER — SODIUM CHLORIDE 0.9% FLUSH: 10.0000 mL | Freq: Once | INTRAVENOUS | Status: DC

## 2021-09-03 NOTE — ED Provider Notes (Addendum)
Emergency Medicine Provider Triage Evaluation Note  Jane Edwards , a 45 y.o. female  was evaluated in triage.  Pt complains of concern for clot of her picc line per ems. Per notes from oncology service, they were concerned for facial swelling, pt is a poor historian  Review of Systems  Positive: Picc line problem Negative: fever  Physical Exam  There were no vitals taken for this visit. Gen:   Awake, no distress   Resp:  Normal effort  MSK:   Moves extremities without difficulty  Other:  No TTP to the picc line noted  Medical Decision Making  Medically screening exam initiated at 3:39 PM.  Appropriate orders placed.  [name] was informed that the remainder of the evaluation will be completed by another provider, this initial triage assessment does not replace that evaluation, and the importance of remaining in the ED until their evaluation is complete.  7:20 PM pt had episode of shaking that was witnessed in triage. Hx seizures and nonepileptic seizures. Pt will be prioritized for a room.    Rodney Booze, PA-C 09/03/21 1546    Lorelle Gibbs, DO 09/03/21 1640    Angelik Walls S, PA-C 09/03/21 1926    Lorelle Gibbs, DO 09/05/21 1540

## 2021-09-03 NOTE — Patient Instructions (Signed)
Heparin injection What is this medication? HEPARIN (HEP a rin) is an anticoagulant. It is used to treat or prevent clots in the veins, arteries, lungs, or heart. It stops clots from forming or getting bigger. This medicine prevents clotting during open-heart surgery, dialysis, or in patients who are confined to bed. This medicine may be used for other purposes; ask your health care provider or pharmacist if you have questions. COMMON BRAND NAME(S): Hep-Lock, Hep-Lock U/P, Hepflush-10, Monoject Prefill Advanced Heparin Lock Flush, SASH Normal Saline and Heparin What should I tell my care team before I take this medication? They need to know if you have any of these conditions: bleeding disorders, such as hemophilia or low blood platelets bowel disease or diverticulitis endocarditis high blood pressure liver disease recent surgery or delivery of a baby stomach ulcers an unusual or allergic reaction to heparin, benzyl alcohol, sulfites, other medicines, foods, dyes, or preservatives pregnant or trying to get pregnant breast-feeding How should I use this medication? This medicine is given by injection or infusion into a vein. It can also be given by injection of small amounts under the skin. It is usually given by a health care professional in a hospital or clinic setting. If you get this medicine at home, you will be taught how to prepare and give this medicine. Use exactly as directed. Take your medicine at regular intervals. Do not take it more often than directed. Do not stop taking except on your doctor's advice. Stopping this medicine may increase your risk of a blot clot. Be sure to refill your prescription before you run out of medicine. It is important that you put your used needles and syringes in a special sharps container. Do not put them in a trash can. If you do not have a sharps container, call your pharmacist or healthcare provider to get one. Talk to your pediatrician regarding the  use of this medicine in children. While this medicine may be prescribed for children for selected conditions, precautions do apply. Overdosage: If you think you have taken too much of this medicine contact a poison control center or emergency room at once. NOTE: This medicine is only for you. Do not share this medicine with others. What if I miss a dose? If you miss a dose, take it as soon as you can. If it is almost time for your next dose, take only that dose. Do not take double or extra doses. What may interact with this medication? Do not take this medicine with any of the following medications: aspirin and aspirin-like drugs mifepristone medicines that treat or prevent blood clots like warfarin, enoxaparin, and dalteparin palifermin protamine This medicine may also interact with the following medications: dextran digoxin hydroxychloroquine medicines for treating colds or allergies nicotine NSAIDs, medicines for pain and inflammation, like ibuprofen or naproxen phenylbutazone tetracycline antibiotics This list may not describe all possible interactions. Give your health care provider a list of all the medicines, herbs, non-prescription drugs, or dietary supplements you use. Also tell them if you smoke, drink alcohol, or use illegal drugs. Some items may interact with your medicine. What should I watch for while using this medication? Visit your healthcare professional for regular checks on your progress. You may need blood work done while you are taking this medicine. Your condition will be monitored carefully while you are receiving this medicine. It is important not to miss any appointments. Wear a medical ID bracelet or chain, and carry a card that describes your disease and details   of your medicine and dosage times. Notify your doctor or healthcare professional at once if you have cold, blue hands or feet. If you are going to need surgery or other procedure, tell your healthcare  professional that you are using this medicine. Avoid sports and activities that might cause injury while you are using this medicine. Severe falls or injuries can cause unseen bleeding. Be careful when using sharp tools or knives. Consider using an Copy. Take special care brushing or flossing your teeth. Report any injuries, bruising, or red spots on the skin to your healthcare professional. Using this medicine for a long time may weaken your bones and increase the risk of bone fractures. You should make sure that you get enough calcium and vitamin D while you are taking this medicine. Discuss the foods you eat and the vitamins you take with your healthcare professional. Wear a medical ID bracelet or chain. Carry a card that describes your disease and details of your medicine and dosage times. What side effects may I notice from receiving this medication? Side effects that you should report to your doctor or health care professional as soon as possible: allergic reactions like skin rash, itching or hives, swelling of the face, lips, or tongue bone pain fever, chills nausea, vomiting signs and symptoms of bleeding such as bloody or black, tarry stools; red or dark-brown urine; spitting up blood or brown material that looks like coffee grounds; red spots on the skin; unusual bruising or bleeding from the eye, gums, or nose signs and symptoms of a blood clot such as chest pain; shortness of breath; pain, swelling, or warmth in the leg signs and symptoms of a stroke such as changes in vision; confusion; trouble speaking or understanding; severe headaches; sudden numbness or weakness of the face, arm or leg; trouble walking; dizziness; loss of coordination Side effects that usually do not require medical attention (report to your doctor or health care professional if they continue or are bothersome): hair loss pain, redness, or irritation at site where injected This list may not describe all  possible side effects. Call your doctor for medical advice about side effects. You may report side effects to FDA at 1-800-FDA-1088. Where should I keep my medication? Keep out of the reach of children. Store unopened vials at room temperature between 15 and 30 degrees C (59 and 86 degrees F). Do not freeze. Do not use if solution is discolored or particulate matter is present. Throw away any unused medicine after the expiration date. NOTE: This sheet is a summary. It may not cover all possible information. If you have questions about this medicine, talk to your doctor, pharmacist, or health care provider.  2022 Elsevier/Gold Standard (2021-01-14 11:02:57)

## 2021-09-03 NOTE — ED Notes (Signed)
Pt asked if she could take her 6PM medications. I notified Triage PA and they stated it is fine. Pt did not have medications with her. I then notified triage RN, and when returned to pt, pt's hand and body was trembling. Triage RN notified. Pt then brought back to triage.

## 2021-09-03 NOTE — Progress Notes (Signed)
Spoke to Dr. Eulis Foster regarding pt concern of swelling in arm. Recommend considering US/CT. Dr. Eulis Foster to assess.
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
## 2021-09-03 NOTE — Progress Notes (Signed)
1345: Patient presented with increase in face/shest upper body edema. Orbits bil almost swolen shut. Saw Dr Julianne Rice yesterday. Patient unable to follow train of thoughts and subject matter today but alert and oriented x 3.Reported to [facility] NP orders to transport to ER for evaluation mother agrees with plan. 1442: Discharged via EMS to transport to ER at [facility] ER per mother request. Report given VSS at transport.

## 2021-09-03 NOTE — ED Triage Notes (Addendum)
Pt here from [place] center for possible clot in PICC line. Per facility, they cannot flush or draw blood off of it. Pt is aox3, does not know why she is here. Pt has facial edema that she says is painful and doesn't know how long it's been going on. Pt reports she is scared she's going to be tied down and kept in the hospital.

## 2021-09-04 ENCOUNTER — Observation Stay (HOSPITAL_COMMUNITY): Payer: Medicare Other

## 2021-09-04 ENCOUNTER — Ambulatory Visit (HOSPITAL_COMMUNITY): Payer: Medicare Other | Admitting: Clinical

## 2021-09-04 ENCOUNTER — Encounter: Payer: Self-pay | Admitting: Oncology

## 2021-09-04 DIAGNOSIS — D500 Iron deficiency anemia secondary to blood loss (chronic): Secondary | ICD-10-CM | POA: Diagnosis not present

## 2021-09-04 DIAGNOSIS — I110 Hypertensive heart disease with heart failure: Secondary | ICD-10-CM | POA: Diagnosis present

## 2021-09-04 DIAGNOSIS — Z981 Arthrodesis status: Secondary | ICD-10-CM | POA: Diagnosis not present

## 2021-09-04 DIAGNOSIS — G40309 Generalized idiopathic epilepsy and epileptic syndromes, not intractable, without status epilepticus: Secondary | ICD-10-CM | POA: Diagnosis present

## 2021-09-04 DIAGNOSIS — Z20822 Contact with and (suspected) exposure to covid-19: Secondary | ICD-10-CM | POA: Diagnosis present

## 2021-09-04 DIAGNOSIS — E7800 Pure hypercholesterolemia, unspecified: Secondary | ICD-10-CM | POA: Diagnosis present

## 2021-09-04 DIAGNOSIS — I10 Essential (primary) hypertension: Secondary | ICD-10-CM | POA: Diagnosis not present

## 2021-09-04 DIAGNOSIS — T82868A Thrombosis of vascular prosthetic devices, implants and grafts, initial encounter: Secondary | ICD-10-CM | POA: Diagnosis present

## 2021-09-04 DIAGNOSIS — T82528A Displacement of other cardiac and vascular devices and implants, initial encounter: Secondary | ICD-10-CM | POA: Diagnosis not present

## 2021-09-04 DIAGNOSIS — H9311 Tinnitus, right ear: Secondary | ICD-10-CM | POA: Diagnosis present

## 2021-09-04 DIAGNOSIS — Z9889 Other specified postprocedural states: Secondary | ICD-10-CM | POA: Diagnosis not present

## 2021-09-04 DIAGNOSIS — G40822 Epileptic spasms, not intractable, without status epilepticus: Secondary | ICD-10-CM

## 2021-09-04 DIAGNOSIS — I871 Compression of vein: Secondary | ICD-10-CM

## 2021-09-04 DIAGNOSIS — Z452 Encounter for adjustment and management of vascular access device: Secondary | ICD-10-CM | POA: Diagnosis not present

## 2021-09-04 DIAGNOSIS — R5382 Chronic fatigue, unspecified: Secondary | ICD-10-CM | POA: Diagnosis present

## 2021-09-04 DIAGNOSIS — T82898A Other specified complication of vascular prosthetic devices, implants and grafts, initial encounter: Secondary | ICD-10-CM

## 2021-09-04 DIAGNOSIS — Z79899 Other long term (current) drug therapy: Secondary | ICD-10-CM | POA: Diagnosis not present

## 2021-09-04 DIAGNOSIS — E1159 Type 2 diabetes mellitus with other circulatory complications: Secondary | ICD-10-CM

## 2021-09-04 DIAGNOSIS — Y712 Prosthetic and other implants, materials and accessory cardiovascular devices associated with adverse incidents: Secondary | ICD-10-CM | POA: Diagnosis present

## 2021-09-04 DIAGNOSIS — J45909 Unspecified asthma, uncomplicated: Secondary | ICD-10-CM | POA: Diagnosis present

## 2021-09-04 DIAGNOSIS — Z91041 Radiographic dye allergy status: Secondary | ICD-10-CM | POA: Diagnosis not present

## 2021-09-04 DIAGNOSIS — I82210 Acute embolism and thrombosis of superior vena cava: Secondary | ICD-10-CM | POA: Diagnosis present

## 2021-09-04 DIAGNOSIS — G4730 Sleep apnea, unspecified: Secondary | ICD-10-CM | POA: Diagnosis present

## 2021-09-04 DIAGNOSIS — Z95828 Presence of other vascular implants and grafts: Secondary | ICD-10-CM | POA: Diagnosis not present

## 2021-09-04 DIAGNOSIS — I5031 Acute diastolic (congestive) heart failure: Secondary | ICD-10-CM | POA: Diagnosis not present

## 2021-09-04 DIAGNOSIS — R609 Edema, unspecified: Secondary | ICD-10-CM | POA: Diagnosis not present

## 2021-09-04 DIAGNOSIS — T82514A Breakdown (mechanical) of infusion catheter, initial encounter: Secondary | ICD-10-CM | POA: Diagnosis present

## 2021-09-04 DIAGNOSIS — Z6841 Body Mass Index (BMI) 40.0 and over, adult: Secondary | ICD-10-CM | POA: Diagnosis not present

## 2021-09-04 DIAGNOSIS — F4310 Post-traumatic stress disorder, unspecified: Secondary | ICD-10-CM | POA: Diagnosis present

## 2021-09-04 LAB — URINALYSIS, COMPLETE (UACMP) WITH MICROSCOPIC
Glucose, UA: NEGATIVE mg/dL
Ketones, ur: NEGATIVE mg/dL
Nitrite: NEGATIVE
Protein, ur: NEGATIVE mg/dL
Specific Gravity, Urine: 1.015 (ref 1.005–1.030)
Squamous Epithelial / HPF: NONE SEEN (ref 0–5)
pH: 6 (ref 5.0–8.0)

## 2021-09-04 LAB — RESP PANEL BY RT-PCR (FLU A&B, COVID) ARPGX2
Influenza A by PCR: NEGATIVE
Influenza B by PCR: NEGATIVE
SARS Coronavirus 2 by RT PCR: NEGATIVE

## 2021-09-04 LAB — PROTIME-INR
INR: 1 (ref 0.8–1.2)
Prothrombin Time: 13.6 seconds (ref 11.4–15.2)

## 2021-09-04 LAB — GLUCOSE, CAPILLARY: Glucose-Capillary: 144 mg/dL — ABNORMAL HIGH (ref 70–99)

## 2021-09-04 LAB — APTT: aPTT: 29 seconds (ref 24–36)

## 2021-09-04 LAB — HEPARIN LEVEL (UNFRACTIONATED): Heparin Unfractionated: 0.37 IU/mL (ref 0.30–0.70)

## 2021-09-04 LAB — HIV ANTIBODY (ROUTINE TESTING W REFLEX): HIV Screen 4th Generation wRfx: NONREACTIVE

## 2021-09-04 MED ORDER — LIDOCAINE HCL 1 % IJ SOLN
INTRAMUSCULAR | Status: AC
  Filled 2021-09-04: qty 20

## 2021-09-04 MED ORDER — TOPIRAMATE ER 150 MG PO SPRINKLE CAP24: 300.0000 mg | EXTENDED_RELEASE_CAPSULE | Freq: Every day | ORAL | Status: DC

## 2021-09-04 MED ORDER — CHLORHEXIDINE GLUCONATE 4 % EX LIQD
CUTANEOUS | Status: AC
  Filled 2021-09-04: qty 15

## 2021-09-04 MED ORDER — AMLODIPINE BESYLATE 10 MG PO TABS
10.0000 mg | ORAL_TABLET | Freq: Every day | ORAL | Status: DC
  Administered 2021-09-04 – 2021-09-07 (×4): 10 mg via ORAL
  Filled 2021-09-04: qty 2
  Filled 2021-09-04 (×3): qty 1

## 2021-09-04 MED ORDER — ONDANSETRON HCL 4 MG PO TABS: 4.0000 mg | ORAL_TABLET | Freq: Four times a day (QID) | ORAL | Status: DC | PRN

## 2021-09-04 MED ORDER — HYDROCHLOROTHIAZIDE 25 MG PO TABS
25.0000 mg | ORAL_TABLET | Freq: Every day | ORAL | Status: DC
  Administered 2021-09-04 – 2021-09-07 (×4): 25 mg via ORAL
  Filled 2021-09-04 (×4): qty 1

## 2021-09-04 MED ORDER — TOPIRAMATE ER 100 MG PO SPRINKLE CAP24
300.0000 mg | EXTENDED_RELEASE_CAPSULE | Freq: Every day | ORAL | Status: DC
  Administered 2021-09-04 – 2021-09-07 (×4): 300 mg via ORAL
  Filled 2021-09-04 (×4): qty 3

## 2021-09-04 MED ORDER — ONDANSETRON HCL 4 MG/2ML IJ SOLN: 4.0000 mg | Freq: Four times a day (QID) | INTRAMUSCULAR | Status: DC | PRN

## 2021-09-04 MED ORDER — MAGNESIUM OXIDE -MG SUPPLEMENT 400 (240 MG) MG PO TABS
400.0000 mg | ORAL_TABLET | Freq: Every day | ORAL | Status: DC
  Administered 2021-09-04 – 2021-09-07 (×4): 400 mg via ORAL
  Filled 2021-09-04 (×4): qty 1

## 2021-09-04 MED ORDER — CHLORHEXIDINE GLUCONATE CLOTH 2 % EX PADS
6.0000 | MEDICATED_PAD | Freq: Every day | CUTANEOUS | Status: DC
  Administered 2021-09-04 – 2021-09-07 (×3): 6 via TOPICAL

## 2021-09-04 MED ORDER — ACETAMINOPHEN 650 MG RE SUPP: 650.0000 mg | Freq: Four times a day (QID) | RECTAL | Status: DC | PRN

## 2021-09-04 MED ORDER — POTASSIUM CHLORIDE CRYS ER 20 MEQ PO TBCR
20.0000 meq | EXTENDED_RELEASE_TABLET | Freq: Two times a day (BID) | ORAL | Status: DC
  Administered 2021-09-04 – 2021-09-06 (×6): 20 meq via ORAL
  Filled 2021-09-04 (×6): qty 1

## 2021-09-04 MED ORDER — FUROSEMIDE 40 MG PO TABS
40.0000 mg | ORAL_TABLET | Freq: Every day | ORAL | Status: DC
  Administered 2021-09-04 – 2021-09-05 (×2): 40 mg via ORAL
  Filled 2021-09-04: qty 2
  Filled 2021-09-04: qty 1

## 2021-09-04 MED ORDER — ACETAMINOPHEN 325 MG PO TABS: 650.0000 mg | ORAL_TABLET | Freq: Four times a day (QID) | ORAL | Status: DC | PRN

## 2021-09-04 MED ORDER — CARVEDILOL 25 MG PO TABS
25.0000 mg | ORAL_TABLET | Freq: Two times a day (BID) | ORAL | Status: DC
  Administered 2021-09-04 – 2021-09-07 (×7): 25 mg via ORAL
  Filled 2021-09-04 (×6): qty 1
  Filled 2021-09-04: qty 2

## 2021-09-04 MED ORDER — TRAZODONE HCL 50 MG PO TABS
50.0000 mg | ORAL_TABLET | Freq: Every day | ORAL | Status: DC
  Administered 2021-09-04: 50 mg via ORAL
  Administered 2021-09-05 – 2021-09-06 (×2): 100 mg via ORAL
  Filled 2021-09-04: qty 1
  Filled 2021-09-04 (×2): qty 2

## 2021-09-04 MED ORDER — CARBAMAZEPINE 200 MG PO TABS
300.0000 mg | ORAL_TABLET | Freq: Two times a day (BID) | ORAL | Status: DC
  Administered 2021-09-04 – 2021-09-07 (×7): 300 mg via ORAL
  Filled 2021-09-04 (×10): qty 1.5

## 2021-09-04 MED ORDER — HEPARIN (PORCINE) 25000 UT/250ML-% IV SOLN
1600.0000 [IU]/h | INTRAVENOUS | Status: DC
Start: 2021-09-04 — End: 2021-09-07
  Administered 2021-09-04: 1500 [IU]/h via INTRAVENOUS
  Administered 2021-09-05 – 2021-09-06 (×3): 1600 [IU]/h via INTRAVENOUS
  Filled 2021-09-04 (×4): qty 250

## 2021-09-04 MED ORDER — DESVENLAFAXINE SUCCINATE ER 100 MG PO TB24
100.0000 mg | ORAL_TABLET | Freq: Every day | ORAL | Status: DC
  Administered 2021-09-04 – 2021-09-07 (×4): 100 mg via ORAL
  Filled 2021-09-04 (×4): qty 1

## 2021-09-04 MED ORDER — HYDROCHLOROTHIAZIDE 25 MG PO TABS: 25.0000 mg | ORAL_TABLET | Freq: Every day | ORAL | Status: DC

## 2021-09-04 MED ORDER — FERROUS SULFATE 325 (65 FE) MG PO TABS
325.0000 mg | ORAL_TABLET | Freq: Every day | ORAL | Status: DC
  Administered 2021-09-04 – 2021-09-07 (×4): 325 mg via ORAL
  Filled 2021-09-04 (×4): qty 1

## 2021-09-04 MED ORDER — HEPARIN SOD (PORK) LOCK FLUSH 100 UNIT/ML IV SOLN
INTRAVENOUS | Status: AC
  Filled 2021-09-04: qty 5

## 2021-09-04 MED ORDER — LIDOCAINE HCL 1 % IJ SOLN
INTRAMUSCULAR | Status: AC
  Administered 2021-09-04: 10 mL
  Filled 2021-09-04: qty 20

## 2021-09-04 MED ORDER — HEPARIN BOLUS VIA INFUSION
6000.0000 [IU] | Freq: Once | INTRAVENOUS | Status: AC
  Administered 2021-09-04: 6000 [IU] via INTRAVENOUS
  Filled 2021-09-04: qty 6000

## 2021-09-04 NOTE — Procedures (Signed)
PROCEDURE SUMMARY:  Successful image-guided exchange of existing single lumen right upper extremity PICC line. Length 38 cm. Tip at upper SVC - unable to pass PICC into lower SVC/RA due to known occlusion. The catheter is past the axillary line and as such may still be used as a central line. No complications. EBL = < 1 mL. Flushed/aspirated/heparin locked. Ready for immediate use.  Please see imaging section of Epic for full dictation.   Joaquim Nam PA-C 09/04/2021 1:04 PM

## 2021-09-04 NOTE — Progress Notes (Signed)
Alteplase removed with no blood return., but brisk flush. PICC tip is suboptimal in subclavian vein. Notified primary RN that PICC may need to be replaced. Please contact IR if this is the desired action.

## 2021-09-04 NOTE — ED Notes (Signed)
IV Team at bedside to obtain peripheral access for CT Venogram chest, HX allergic reaction to contrast requiring pre-medication, and concerns for PICC patency.
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
## 2021-09-04 NOTE — Progress Notes (Signed)
Spoke with Primary RN re: IV consult for PICC problem, PICC is malpositioned. As per recommendation, for IR evaluation.

## 2021-09-04 NOTE — Progress Notes (Signed)
ANTICOAGULATION CONSULT NOTE - Follow Up Consult  Pharmacy Consult for heparin Indication:  SVC occlusion  Labs: Recent Labs    09/03/21 1546 09/04/21 1344 09/04/21 2228  HGB 12.7  --   --   HCT 42.7  --   --   PLT 161  --   --   APTT  --  29  --   LABPROT  --  13.6  --   INR  --  1.0  --   HEPARINUNFRC  --   --  0.37  CREATININE 1.12*  --   --     Assessment: 44yo female therapeutic on heparin with initial dosing but at very low end of goal; no infusion issues or signs of bleeding per RN.  Goal of Therapy:  Heparin level 0.3-0.7 units/ml   Plan:  Will increase heparin infusion slightly to 1600 units/hr and check level in 6 hours.    Wynona Neat, PharmD, BCPS  09/04/2021,11:53 PM

## 2021-09-04 NOTE — Progress Notes (Signed)
Patient arrived to 4E11 from ED. CHG bath completed. Patient placed on telemetry and CCMD notified. Bed low and locked with bed alarm in engaged, call bell within reach.

## 2021-09-04 NOTE — ED Notes (Signed)
Pt transported to IR 

## 2021-09-04 NOTE — ED Notes (Signed)
Discussed PICC line patency concerns with Dr Jacelyn Grip. Informed inappropriate placement of PICC with questionable patency, likely will require replacement.

## 2021-09-04 NOTE — ED Notes (Signed)
This RN spoke with the IV team about pt's PICC line. Pt's PICC is currently placed near the subclavian and will not draw back. Per IV team PICC needs to be replaced, they do not recommend further use. MD notified via secure chat. Will continue to monitor pt.

## 2021-09-04 NOTE — ED Notes (Signed)
Attempted peripheral IV, unsuccessful. Per previous conversation with IV team RN Danny, questionable position of PICC line. New order for IV team concult placed pending response
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
## 2021-09-04 NOTE — Progress Notes (Addendum)
Patient seen and examined.  Admitted early morning hours by nighttime hospitalist.  Admit patient and her mother at the bedside.  Patient was fairly sleepy and looked comfortable.  She was snoring and tired being awake all night.  Mother supplemented history at the bedside.  45 year old female with extensive medical issues including morbid obesity, hypertension, type 2 diabetes on diet control, seizure disorder, Jehovah's Witness with chronic iron-deficiency anemia on chronic iron infusions every 3 months, dysfunctional implanted port, dysfunctional PICC line presented with swelling of the right arm extending to right neck and right side of the face.  Recently diagnosed with right-sided SVC syndrome with occlusion of the SVC at the junction of brachiocephalic veins.  Presented to the ER from cardiology office with significant swelling and discomfort.  Case discussed with interventional radiology, patient was in the process of rescheduling for SVC stenting and new port placement with removal of present port as well as PICC line.  IR to follow-up if any inpatient procedures can be done.  Patient currently with no IV access, discussed with interventional radiology for PICC line exchange for temporary IV access before definitive procedure planned.  Other medical issues remained stable.  Discharge plan remains on hold pending interventional radiology planning/recommendations.   No charge visit.  Addendum.  Patient seen and examined by interventional radiology providers.  Case discussed.  Patient will need to be planning for all the procedures that were scheduled including SVC stenting.  Patient is hemodynamically stable at this time. IR was able to exchange her PICC line. Suggested restarting on anticoagulation and monitoring for response.  If she has adequate improvement of SVC symptoms on anticoagulation, planning for long-term anticoagulation. IR will continue to plan for SVC stenting, port placement  in the future.

## 2021-09-04 NOTE — ED Notes (Signed)
Admitting provider Alcario Drought discussed issue with PICC line, pending CT venogram, and medications. MD aware pt does not currently have peripheral access. Per MD pt to go to IR later this morning. MD at bedside

## 2021-09-04 NOTE — ED Notes (Signed)
Pt provided with hospital gown and placed on purwick. Mother at bedside concerned for urinary infection. Message relayed to Dr. Alcario Drought.

## 2021-09-04 NOTE — ED Notes (Signed)
Central Line cart outside pt room. Pt updated and rounded. Family remains at bedside. Delay explained to patient

## 2021-09-04 NOTE — ED Provider Notes (Signed)
  Physical Exam  BP 134/75   Pulse 65   Temp 98.2 F (36.8 C) (Oral)   Resp (!) 23   SpO2 100%   Physical Exam  ED Course/Procedures   Clinical Course as of 09/04/21 0647  [facility] Sep 04, 2021  [id number] I spoke with Dr. Stanford Breed, vascular surgery. Recommends IR c/s. No need for emergent treatment. [AW]  0343 I spoke with IR on call. OK to admit to hospitalist service. OK to admit with no peripheral access. [AW]  0617 I spoke with Dr. Alcario Drought who will admit the patient. [AW]    Clinical Course User Index [AW] Arnaldo Natal, MD    Procedures  MDM  Shift Change:  Wenona Magnone is awaiting CT venogram.  She has an IV contrast dye allergy and is being pretreated.  I received the above signout.  Shortly after the patient was signed out to me, her nurse came to me and notify me that she had no access.  She was unable to receive either pretreatment or a CT venogram.    I reassessed the patient and spoke with her mother who was also in the room.  The patient has had worsening right arm swelling and swelling noted to her right neck and face consistent with her diagnosis of superior vena cava syndrome.  I had entertain the idea of placing a central line in order to obtain pretreatment and a CT venogram.  However, after talking to the patient and examining her, it became clear that she had had multiple different central access points.  IR procedure had been planned to address her current malfunctioning PICC line.  Instead of attempting to obtain further access, I called vascular surgery and then interventional radiology.  This ultimately resulted in her being admitted for more urgent interventional radiology treatment.         Arnaldo Natal, MD 09/04/21 [phone]

## 2021-09-04 NOTE — Progress Notes (Signed)
ANTICOAGULATION CONSULT NOTE - Follow Up Consult  Pharmacy Consult for heparin Indication:  SVC occlusion  Allergies  Allergen Reactions   Gabapentin Anaphylaxis   Ibuprofen Anaphylaxis   Iodinated Diagnostic Agents Hives   Latex Hives   Codeine    Dilaudid [Hydromorphone]     Caused seizure   Lisinopril    Rocephin [Ceftriaxone]     Patient Measurements: Height: 5' 1.6" (156.5 cm) Weight: (!) 147.7 kg (325 lb 9.9 oz) IBW/kg (Calculated) : 49.18 Heparin Dosing Weight: 87.3 kg  Vital Signs: Temp: 98.6 F (37 C) (09/16 1128) Temp Source: Oral (09/16 1128) BP: 118/60 (09/16 1128) Pulse Rate: 82 (09/16 1128)  Labs: Recent Labs    09/03/21 1546  HGB 12.7  HCT 42.7  PLT 161  CREATININE 1.12*    Estimated Creatinine Clearance: 90.6 mL/min (A) (by C-G formula based on SCr of 1.12 mg/dL (H)).   Medications: see MAR  Assessment: 45 yo F w/ multiple medical issues, found to have R sided SVC syndrome with occlusion. Heparin consult for therapeutic AC. No AC PTA. CBC wnl.   Goal of Therapy:  Heparin level 0.3-0.7 units/ml Monitor platelets by anticoagulation protocol: Yes   Plan:  Give 6000 units bolus x 1 Start heparin infusion at 1500 units/hr Check anti-Xa level in 6 hours and daily while on heparin Continue to monitor H&H and platelets  Joetta Manners, PharmD, [facility] Emergency Medicine Clinical Pharmacist ED RPh Phone: [place]: [phone]

## 2021-09-05 LAB — CBC
HCT: 37.9 % (ref 36.0–46.0)
Hemoglobin: 12.1 g/dL (ref 12.0–15.0)
MCH: 26.5 pg (ref 26.0–34.0)
MCHC: 31.9 g/dL (ref 30.0–36.0)
MCV: 83.1 fL (ref 80.0–100.0)
Platelets: 191 10*3/uL (ref 150–400)
RBC: 4.56 MIL/uL (ref 3.87–5.11)
RDW: 17.5 % — ABNORMAL HIGH (ref 11.5–15.5)
WBC: 4.8 10*3/uL (ref 4.0–10.5)
nRBC: 0 % (ref 0.0–0.2)

## 2021-09-05 LAB — GLUCOSE, CAPILLARY
Glucose-Capillary: 117 mg/dL — ABNORMAL HIGH (ref 70–99)
Glucose-Capillary: 133 mg/dL — ABNORMAL HIGH (ref 70–99)
Glucose-Capillary: 145 mg/dL — ABNORMAL HIGH (ref 70–99)
Glucose-Capillary: 141 mg/dL — ABNORMAL HIGH (ref 70–99)

## 2021-09-05 LAB — HEPARIN LEVEL (UNFRACTIONATED): Heparin Unfractionated: 0.48 IU/mL (ref 0.30–0.70)

## 2021-09-05 MED ORDER — FUROSEMIDE 10 MG/ML IJ SOLN
40.0000 mg | Freq: Two times a day (BID) | INTRAMUSCULAR | Status: DC
  Administered 2021-09-05 – 2021-09-07 (×4): 40 mg via INTRAVENOUS
  Filled 2021-09-05 (×4): qty 4

## 2021-09-05 NOTE — Progress Notes (Addendum)
ANTICOAGULATION CONSULT NOTE - Follow Up Consult  Pharmacy Consult for heparin Indication:  SVC occlusion  Labs: Recent Labs    09/03/21 1546 09/04/21 1344 09/04/21 2228 09/05/21 0614  HGB 12.7  --   --  12.1  HCT 42.7  --   --  37.9  PLT 161  --   --  191  APTT  --  29  --   --   LABPROT  --  13.6  --   --   INR  --  1.0  --   --   HEPARINUNFRC  --   --  0.37 0.48  CREATININE 1.12*  --   --   --      Assessment: 44yo female therapeutic on heparin with most recent heparin level at 0.48; no infusion issues or signs of bleeding per RN. CBC stable, will continue to monitor.  Goal of Therapy:  Heparin level 0.3-0.7 units/ml   Plan:  Continue heparin at 1600 units/hr  Daily heparin level and CBC  Thank you for allowing pharmacy to participate in this patient's care.  Reatha Harps, PharmD PGY1 Pharmacy Resident 09/05/2021 7:38 AM Check [email or web address] for unit specific pharmacy number  ADDENDUM:  Nursing reported occlusion in PICC line 9/16. Heparin level therapeutic and PICC line exchanged. Will continue to monitor.

## 2021-09-05 NOTE — Progress Notes (Signed)
Referring Physician(s): Barb Merino  Supervising Physician: Markus Daft  Patient Status:  [facility] - In-pt  Chief Complaint: Follow up SVC syndrome  Subjective:  Parents at bedside, Dr. Anselm Pancoast reviewed the known clot around the site of her previous right sided port and that her eft sided port is blocked at site of where veins in arms and neck drain back to heart and that the blood is not draining as quickly as it should which is she is swollen. We also reviewed plans to remove port/venogram/attempt to traverse blockage/balloon angioplasty and stenting in the near future, however due to inability to obtain the appropriate supplies at this time we cannot proceed as soon as hoped.  Jeslie is unsure if her swelling is better, she complains of ankle pain and chest pain but has difficulty describing the pain and when it started. She denies any trouble breathing, jaw pain or  arm pain. Her parents think swelling has not improved and they are requesting to "remove some more of the fluid if possible," her mother expresses particular concerned about "fluid on her face" and is wondering if the fluid pills can be increased. She states that if the swelling can be improved some she would be comfortable taking Sherisse home to care for her, however with the swelling she is not herself and is not able to engage as she usually does. She is also requesting a repeat COVID test prior to returning home. We discussed potential benefits of anticoagulation at this juncture if her swelling improves with heparin drip, including continued improvement in swelling and prevention of further clot formation/enlargement of known clots - her mother expresses significant concern with anticoagulation due to a history of frequent seizures which cause her to hit her head. She is not comfortable administering Lovenox shots at home. She is concerned that the PICC line sticks out of her arm farther than the previous one and that she will pull this  out when she has a seizure, they have previously tried placing a sock over this area which did not help. We reviewed that PICC is in a vein and inadvertent removal is very unlikely to cause life threatening bleeding, we also reviewed that if the PICC were to be removed she should hold pressure at the exit site until the bleeding stops and place a bandaid over the area.   Discussed that it is possible that unable to successfully traverse the blockage and if the procedure is unsuccessful she will likely compensate with collaterals. However we would discuss the next steps once she has undergone the procedure. They express concern about her having diabetes and needing to take steroids prior to contrast exams which spikes her blood sugar and feel that a lot of tests are being repeated unnecessarily.  Allergies: Gabapentin, Ibuprofen, Iodinated diagnostic agents, Latex, Codeine, Dilaudid [hydromorphone], Lisinopril, and Rocephin [ceftriaxone]  Medications: Prior to Admission medications   Medication Sig Start Date End Date Taking? Authorizing Provider  amLODipine (NORVASC) 10 MG tablet Take 10 mg by mouth daily. 01/04/20  Yes [provider]  carbamazepine (TEGRETOL) 200 MG tablet Take 1.5 tablets (300 mg total) by mouth 2 (two) times daily. 02/05/21  Yes Kathrynn Ducking, MD  carvedilol (COREG) 25 MG tablet Take 25 mg by mouth 2 (two) times daily with a meal. 11/06/18  Yes [provider]  Cholecalciferol (D3-50) 1.25 MG (50000 UT) capsule Take 50,000 Units by mouth once a week.   Yes [provider]  desvenlafaxine (PRISTIQ) 100 MG  24 hr tablet Take 100 mg by mouth daily. 07/30/21  Yes [provider]  ferrous sulfate 325 (65 FE) MG tablet Take 325 mg by mouth daily with breakfast.   Yes [provider]  furosemide (LASIX) 40 MG tablet Take 40 mg by mouth daily. 07/30/21  Yes [provider]  magnesium oxide (MAG-OX) 400 MG tablet Take 400 mg by mouth  daily.   Yes [provider]  medroxyPROGESTERone Acetate 150 MG/ML SUSY Inject 1 mL into the muscle every 3 (three) months. 07/11/21  Yes [provider]  potassium chloride (KLOR-CON) 10 MEQ tablet Take 20 mEq by mouth 2 (two) times daily. 06/28/21  Yes [provider]  topiramate ER (QUDEXY XR) 150 MG CS24 sprinkle capsule Take 2 capsules (300 mg total) by mouth daily. 02/05/21  Yes Kathrynn Ducking, MD  traZODone (DESYREL) 100 MG tablet Take 0.5-1 tablets (50-100 mg total) by mouth at bedtime. 08/13/21  Yes Arfeen, Arlyce Harman, MD     Vital Signs: BP 114/63 (BP Location: Left Wrist)   Pulse 76   Temp 98.2 F (36.8 C) (Oral)   Resp 20   Ht 5' 1.6" (1.565 m)   Wt (!) 325 lb 9.9 oz (147.7 kg)   SpO2 97%   BMI 60.33 kg/m   Physical Exam Vitals and nursing note reviewed.  Constitutional:      General: She is not in acute distress.    Comments: Talkative, remembers myself and Dr. Anselm Pancoast from previous conversations. Parents at bedside today.  HENT:     Head: Normocephalic.  Cardiovascular:     Rate and Rhythm: Normal rate.  Pulmonary:     Effort: Pulmonary effort is normal.  Skin:    General: Skin is warm and dry.     Comments: 1+ non pitting edema to BUE, chest and face - similar appearance to my exam yesterday.  Neurological:     Mental Status: She is alert. Mental status is at baseline.    Imaging: DG Chest 2 View  Result Date: 09/03/2021 CLINICAL DATA:  PICC problem EXAM: CHEST - 2 VIEW COMPARISON:  06/05/2021, interventional images 08/10/2021 FINDINGS: Right upper extremity central venous catheter appears slightly withdrawn as compared with spot fluoroscopic image from 08/10/2021. The tip projects over the subclavian region. A left-sided central venous port remains in place with tip over the SVC confluence. No focal opacity or pleural effusion. Normal cardiac size. No pneumothorax IMPRESSION: 1. Right upper extremity central venous catheter tip appears  slightly retracted, it projects over the subclavian region, beneath the medial right clavicle 2. Lung fields are clear Electronically Signed   By: Donavan Foil M.D.   On: 09/03/2021 16:51   IR PICC REPLACEMENT RIGHT INC IMG GUIDE  Result Date: 09/04/2021 INDICATION: Patient with history of SVC syndrome with known occlusion of the superior aspect of the SVC at the junction of the bilateral brachiocephalic veins as well as multiple hypertrophied mediastinal venous collaterals with non occlusive thrombus about the central aspect of the left subclavian vein approach port a catheter based on CT venogram chest 08/21/21. Patient has right upper extremity PICC not placed in IR which was noted to be retracted into the subclavian vein and does not aspirate. Request for exchange of existing right upper extremity PICC for ongoing venous access. EXAM: RIGHT UPPER EXTREMITY PICC LINE EXCHANGE WITH FLUOROSCOPIC GUIDANCE MEDICATIONS: None. ANESTHESIA/SEDATION: None. FLUOROSCOPY TIME:  Fluoroscopy Time: 3 minutes 54 seconds (83 mGy). COMPLICATIONS: None immediate. PROCEDURE: The patient was advised  of the possible risks and complications and agreed to undergo the procedure. The patient was then brought to the angiographic suite for the procedure. The right arm was prepped with chlorhexidine, draped in the usual sterile fashion using maximum barrier technique (cap and mask, sterile gown, sterile gloves, large sterile sheet, hand hygiene and cutaneous antisepsis) and infiltrated locally with 1% Lidocaine. A 0.018 wire was introduced in to the vein via the existing PICC line, the wire was noted to preferentially enter the left subclavian vein and was unable to be passed into the lower SVC/right atrium. After multiple attempts the wire was able to be advanced to the upper portion of the SVC near the tip of the existing left port. Over this, a 5 [place] single lumen power-injectable PICC was advanced to the upper portion of the SVC.  Fluoroscopy during the procedure and fluoro spot radiograph confirms appropriate catheter position. The catheter was flushed, aspirated, heparin locked and covered with a sterile dressing. Catheter length: 38 cm IMPRESSION: Successful right arm Power PICC line exchange with fluoroscopic guidance. The catheter tip terminates beyond the right axillary line within the upper portion of the SVC. Read by Candiss Norse, PA-C Electronically Signed   By: Aletta Edouard M.D.   On: 09/04/2021 13:53    Labs:  CBC: Recent Labs    04/07/21 0000 07/31/21 0000 09/03/21 1546 09/05/21 0614  WBC 7.1 4.8 5.9 4.8  HGB 9.9* 11.8* 12.7 12.1  HCT 33* 37 42.7 37.9  PLT 286 208 161 191    COAGS: Recent Labs    09/04/21 1344  INR 1.0  APTT 29    BMP: Recent Labs    01/14/21 1150 04/07/21 0000 07/31/21 0000 09/03/21 1546  NA 140 136* 138 137  K 4.4 3.6 4.0 3.9  CL 109* 98* 107 105  CO2 18* 30* 22 20*  GLUCOSE 123*  --   --  125*  BUN '8 6 9 6  '$ CALCIUM 9.1 9.0 8.9 8.7*  CREATININE 0.86 0.7 1.0 1.12*  GFRNONAA 83  --   --  >60  GFRAA 96  --   --   --     LIVER FUNCTION TESTS: Recent Labs    01/14/21 1150 04/07/21 0000 07/31/21 0000 09/03/21 1546  BILITOT 0.3  --   --  0.6  AST 24 27 46* 76*  ALT 18 21 32 69*  ALKPHOS 108 137* 101 93  PROT 7.0  --   --  6.7  ALBUMIN 4.1 4.3 4.0 3.6    Assessment and Plan:  45 y/o F with history of known SVC syndrome due to multiple ports/iron infusions followed by IR and planned for intervention as an outpatient who presented to [facility] ED yesterday with worsening upper extremity and facial edema seen today for follow up of yesterday's discussion.  Parents at bedside today, patient and parents do not feel that the swelling has improved after starting heparin gtt. They express significant concern regarding the swelling and are requesting that her diuretics be increased, they wish to improve her swelling and take her home if she not planned for an IR  procedure in the near future.   Extensive discussion was had with patient, her parents and Dr. Anselm Pancoast about the cause of her swelling, our intentions for possible treatment, the potential of the procedure being unsuccessful and the potential plans if that were the case. Specifically we discussed possibly starting oral anticoagulation upon discharge while she is awaiting IR procedure with the hopes that  this would continue to improve her swelling and prevent further clot formation, however her mother tells [name] that Hermoine has seizure frequently and often hits her head. Given that the patient and her family are stating that her swelling does not seem to be improved after starting heparin gtt and the high risk of intracranial bleeding on anticoagulation we do not plan on continuing oral anticoagulation at discharge. We discussed the possibility of using Lovenox instead of NOAC however her parents are not comfortable giving Lovenox injections.   Plan: - Continue heparin gtt while in house if swelling is felt to be improving by primary team evaluation - No oral anticoagulation at discharge due to high risk of intracranial hemorrhage 2/2 seizures. Lovenox was considered however the parents are uncomfortable administering and patient cannot administer herself - Continue PICC line at home for infusions - We will continue to work on timing for central venogram with angioplasty/stent placement, removal of left sided port and placement of new port once we are able to procure the appropriate equipment. We will contact the patient's mother once this can be arranged as an outpatient. - From IR perspective she can be discharged if that is what her family is comfortable with and she is medically stable for discharge per primary team.  Discussed above with Dr. Sloan Leiter via phone today, he will assess the patient and decide on discharge planning. We are happy to return to discuss anything further with Ceriah and her family. I  have placed our contact information in the AVS.    Electronically Signed: Joaquim Nam, PA-C 09/05/2021, 11:14 AM   I spent a total of 25 Minutes at the the patient's bedside AND on the patient's hospital floor or unit, greater than 50% of which was counseling/coordinating care for SVC syndrome follow up.

## 2021-09-05 NOTE — Progress Notes (Signed)
PROGRESS NOTE    Jane Edwards  [id number] DOB: December 01, 1976 DOA: 09/03/2021 PCP: Leonides Sake, MD    Brief Narrative:  45 year old female with extensive medical issues including morbid obesity, hypertension, type 2 diabetes on diet control, seizure disorder, Je[employer]'s [employer] with chronic iron-deficiency anemia on chronic iron infusions every 3 months, dysfunctional implanted port, dysfunctional PICC line presented with swelling of the right arm extending to right neck and right side of the face.  Recently diagnosed with right-sided SVC syndrome with occlusion of the SVC at the junction of brachiocephalic veins.  Presented to the ER from cardiology office with significant swelling and discomfort.  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 -Admitted with significant symptoms.  Treated with empiric heparin with plan to change to oral anticoagulation on discharge, family disagreed.  Continue heparin while in the hospital. -Interventional radiology planning for removal of the port with venogram and balloon angioplasty and stenting, logistically not possible so waiting. -We will check echocardiogram and empirically treat with IV diuretics today to see if she has any improvement on her overall generalized swelling.  Essential hypertension: Blood pressure stable on home medications.  Type 2 diabetes: Well-controlled.  Diet controlled.  No indication for treatment.  Seizure disorder: On Tegretol and topiramate.   Continue.  Chronic iron-deficiency anemia: Stable.  Sleep apnea: Uses CPAP at night.   DVT prophylaxis: SCDs Start: 09/04/21 0417   Code Status: Full code Family Communication: Mother and father at bedside Disposition Plan: Status is: Inpatient  Remains inpatient appropriate because:Inpatient level of care appropriate due to severity of illness  Dispo: The patient is from: Home              Anticipated d/c is to: Home              Patient currently is not medically stable to d/c.   Difficult to place patient No         Consultants:  Interventional radiology  Procedures:  None  Antimicrobials:  None   Subjective: Patient seen and examined.  In the morning rounds, she looks fairly well and denied any chest pain shortness of breath or difficulty swallowing.  She was eating her breakfast. Her parents arrived, discussed with patient and family.  They are extremely worried about swelling of the face and arms. They are okay with continuing heparin while in the hospital, she does not want to do Lovenox at home. Patient and family are frustrated with unable to have procedure and unable to have improvement on the facial and hand swelling.  Objective: Vitals:   09/04/21 2351 09/05/21 0323 09/05/21 0834 09/05/21 1100  BP: 106/64 (!) 132/94 127/66 114/63  Pulse: 69 65 73 76  Resp: '20 13 16 20  '$ Temp: 98.1 F (36.7 C) 98.4 F (36.9 C) 98.2 F (36.8 C) 98.2 F (36.8 C)  TempSrc: Oral Oral Oral Oral  SpO2: 96% 99% 99% 97%  Weight:      Height:        Intake/Output Summary (Last 24 hours) at 09/05/2021 1302 Last data filed  at 09/05/2021 0834 Gross per 24 hour  Intake 931.39 ml  Output 300 ml  Net 631.39 ml   Filed Weights   09/04/21 1300  Weight: (!) 147.7 kg    Examination:  General exam: Chronically sick looking.  Morbidly obese.  Looks comfortable on room air.  Talkative. Respiratory system: No added sounds. Cardiovascular system: S1 & S2 heard, RRR.   Gastrointestinal system: Obese and pendulous.  Bowel sounds present. Central nervous system: Alert and oriented. No focal neurological deficits. Extremities: Symmetric 5 x 5 power. Generalized swelling. Right hand is more puffier than left hand.    Data Reviewed: I have personally reviewed following labs and imaging studies  CBC: Recent Labs  Lab 09/03/21 1546 09/05/21 0614  WBC 5.9 4.8  NEUTROABS 4.6  --   HGB 12.7 12.1  HCT 42.7 37.9  MCV 89.3 83.1  PLT 161 [id number]   Basic Metabolic Panel: Recent Labs  Lab 09/03/21 1546  NA 137  K 3.9  CL 105  CO2 20*  GLUCOSE 125*  BUN 6  CREATININE 1.12*  CALCIUM 8.7*   GFR: Estimated Creatinine Clearance: 90.6 mL/min (A) (by C-G formula based on SCr of 1.12 mg/dL (H)). Liver Function Tests: Recent Labs  Lab 09/03/21 1546  AST 76*  ALT 69*  ALKPHOS 93  BILITOT 0.6  PROT 6.7  ALBUMIN 3.6   No results for input(s): LIPASE, AMYLASE in the last 168 hours. No results for input(s): AMMONIA in the last 168 hours. Coagulation Profile: Recent Labs  Lab 09/04/21 1344  INR 1.0   Cardiac Enzymes: No results for input(s): CKTOTAL, CKMB, CKMBINDEX, TROPONINI in the last 168 hours. BNP (last 3 results) No results for input(s): PROBNP in the last 8760 hours. HbA1C: No results for input(s): HGBA1C in the last 72 hours. CBG: Recent Labs  Lab 09/03/21 2300 09/04/21 2120 09/05/21 0619 09/05/21 1057  GLUCAP 104* 144* 117* 133*   Lipid Profile: No results for input(s): CHOL, HDL, LDLCALC, TRIG, CHOLHDL, LDLDIRECT in the last 72 hours. Thyroid Function Tests: No results for input(s): TSH, T4TOTAL, FREET4, T3FREE, THYROIDAB in the last 72 hours. Anemia Panel: No results for input(s): VITAMINB12, FOLATE, FERRITIN, TIBC, IRON, RETICCTPCT in the last 72 hours. Sepsis Labs: No results for input(s): PROCALCITON, LATICACIDVEN in the last 168 hours.  Recent Results (from the past 240 hour(s))  Resp Panel by RT-PCR (Flu A&B, Covid)  Nasopharyngeal Swab     Status: None   Collection Time: 09/04/21  2:06 AM   Specimen: Nasopharyngeal Swab; Nasopharyngeal(NP) swabs in vial transport medium  Result Value Ref Range Status   SARS Coronavirus 2 by RT PCR NEGATIVE NEGATIVE Final    Comment: (NOTE) SARS-CoV-2 target nucleic acids are NOT DETECTED.  The SARS-CoV-2 RNA is generally detectable in upper respiratory specimens during the acute phase of infection. The lowest concentration of SARS-CoV-2 viral copies this assay can detect is 138 copies/mL. A negative result does not preclude SARS-Cov-2 infection and should not be used as the sole basis for treatment or other patient management decisions. A negative result may occur with  improper specimen collection/handling, submission of specimen other than nasopharyngeal swab, presence of viral mutation(s) within the areas targeted by this assay, and inadequate number of viral copies(<138 copies/mL). A negative result must be combined with clinical observations, patient history, and epidemiological information. The expected result is Negative.  Fact Sheet for Patients:  EntrepreneurPulse.com.au  Fact Sheet for Healthcare Providers:  IncredibleEmployment.be  This test is no t yet approved or  cleared by the [place] and  has been authorized for detection and/or diagnosis of SARS-CoV-2 by FDA under an Emergency Use Authorization (EUA). This EUA will remain  in effect (meaning this test can be used) for the duration of the COVID-19 declaration under Section 564(b)(1) of the Act, 21 U.S.C.section 360bbb-3(b)(1), unless the authorization is terminated  or revoked sooner.       Influenza A by PCR NEGATIVE NEGATIVE Final   Influenza B by PCR NEGATIVE NEGATIVE Final    Comment: (NOTE) The Xpert Xpress SARS-CoV-2/FLU/RSV plus assay is intended as an aid in the diagnosis of influenza from Nasopharyngeal swab specimens and should not be  used as a sole basis for treatment. Nasal washings and aspirates are unacceptable for Xpert Xpress SARS-CoV-2/FLU/RSV testing.  Fact Sheet for Patients: EntrepreneurPulse.com.au  Fact Sheet for Healthcare Providers: IncredibleEmployment.be  This test is not yet approved or cleared by the [place] FDA and has been authorized for detection and/or diagnosis of SARS-CoV-2 by FDA under an Emergency Use Authorization (EUA). This EUA will remain in effect (meaning this test can be used) for the duration of the COVID-19 declaration under Section 564(b)(1) of the Act, 21 U.S.C. section 360bbb-3(b)(1), unless the authorization is terminated or revoked.  Performed at [facility], [place] [address] [place], [place] [address]          Radiology Studies: DG Chest 2 View  Result Date: 09/03/2021 CLINICAL DATA:  PICC problem EXAM: CHEST - 2 VIEW COMPARISON:  06/05/2021, interventional images 08/10/2021 FINDINGS: Right upper extremity central venous catheter appears slightly withdrawn as compared with spot fluoroscopic image from 08/10/2021. The tip projects over the subclavian region. A left-sided central venous port remains in place with tip over the SVC confluence. No focal opacity or pleural effusion. Normal cardiac size. No pneumothorax IMPRESSION: 1. Right upper extremity central venous catheter tip appears slightly retracted, it projects over the subclavian region, beneath the medial right clavicle 2. Lung fields are clear Electronically Signed   By: Donavan Foil M.D.   On: 09/03/2021 16:51   IR PICC REPLACEMENT RIGHT INC IMG GUIDE  Result Date: 09/04/2021 INDICATION: Patient with history of SVC syndrome with known occlusion of the superior aspect of the SVC at the junction of the bilateral brachiocephalic veins as well as multiple hypertrophied mediastinal venous collaterals with non occlusive thrombus about the central aspect of the left  subclavian vein approach port a catheter based on CT venogram chest 08/21/21. Patient has right upper extremity PICC not placed in IR which was noted to be retracted into the subclavian vein and does not aspirate. Request for exchange of existing right upper extremity PICC for ongoing venous access. EXAM: RIGHT UPPER EXTREMITY PICC LINE EXCHANGE WITH FLUOROSCOPIC GUIDANCE MEDICATIONS: None. ANESTHESIA/SEDATION: None. FLUOROSCOPY TIME:  Fluoroscopy Time: 3 minutes 54 seconds (83 mGy). COMPLICATIONS: None immediate. PROCEDURE: The patient was advised of the possible risks and complications and agreed to undergo the procedure. The patient was then brought to the angiographic suite for the procedure. The right arm was prepped with chlorhexidine, draped in the usual sterile fashion using maximum barrier technique (cap and mask, sterile gown, sterile gloves, large sterile sheet, hand hygiene and cutaneous antisepsis) and infiltrated locally with 1% Lidocaine. A 0.018 wire was introduced in to the vein via the existing PICC line, the wire was noted to preferentially enter the left subclavian vein and was unable to be passed into the lower SVC/right atrium. After multiple attempts the wire was able  to be advanced to the upper portion of the SVC near the tip of the existing left port. Over this, a 5 [place] single lumen power-injectable PICC was advanced to the upper portion of the SVC. Fluoroscopy during the procedure and fluoro spot radiograph confirms appropriate catheter position. The catheter was flushed, aspirated, heparin locked and covered with a sterile dressing. Catheter length: 38 cm IMPRESSION: Successful right arm Power PICC line exchange with fluoroscopic guidance. The catheter tip terminates beyond the right axillary line within the upper portion of the SVC. Read by Candiss Norse, PA-C Electronically Signed   By: Aletta Edouard M.D.   On: 09/04/2021 13:53        Scheduled Meds:  amLODipine  10 mg Oral  Daily   carbamazepine  300 mg Oral BID   carvedilol  25 mg Oral BID WC   Chlorhexidine Gluconate Cloth  6 each Topical Daily   desvenlafaxine  100 mg Oral Daily   ferrous sulfate  325 mg Oral Q breakfast   furosemide  40 mg Intravenous Q12H   hydrochlorothiazide  25 mg Oral Daily   magnesium oxide  400 mg Oral Daily   potassium chloride  20 mEq Oral BID   topiramate ER  300 mg Oral Daily   traZODone  50-100 mg Oral QHS   Continuous Infusions:  heparin 1,600 Units/hr (09/05/21 0530)     LOS: 1 day    Time spent: 30 minutes    Barb Merino, MD [facility] Pager [phone]

## 2021-09-06 ENCOUNTER — Inpatient Hospital Stay (HOSPITAL_COMMUNITY): Payer: Medicare Other

## 2021-09-06 DIAGNOSIS — I5031 Acute diastolic (congestive) heart failure: Secondary | ICD-10-CM | POA: Diagnosis not present

## 2021-09-06 HISTORY — PX: IR REMOVAL TUN ACCESS W/ PORT W/O FL MOD SED: IMG2290

## 2021-09-06 LAB — HEPARIN LEVEL (UNFRACTIONATED): Heparin Unfractionated: 0.37 IU/mL (ref 0.30–0.70)

## 2021-09-06 LAB — ECHOCARDIOGRAM COMPLETE
AR max vel: 2.16 cm2
AV Area VTI: 2.32 cm2
AV Area mean vel: 2.12 cm2
AV Mean grad: 4 mmHg
AV Peak grad: 7 mmHg
Ao pk vel: 1.32 m/s
Area-P 1/2: 3.6 cm2
Height: 61.6 in
S' Lateral: 2.9 cm
Weight: 5209.91 oz

## 2021-09-06 LAB — CBC
HCT: 36.4 % (ref 36.0–46.0)
Hemoglobin: 11.9 g/dL — ABNORMAL LOW (ref 12.0–15.0)
MCH: 27.2 pg (ref 26.0–34.0)
MCHC: 32.7 g/dL (ref 30.0–36.0)
MCV: 83.1 fL (ref 80.0–100.0)
Platelets: 196 10*3/uL (ref 150–400)
RBC: 4.38 MIL/uL (ref 3.87–5.11)
RDW: 17.4 % — ABNORMAL HIGH (ref 11.5–15.5)
WBC: 4.7 10*3/uL (ref 4.0–10.5)
nRBC: 0 % (ref 0.0–0.2)

## 2021-09-06 LAB — GLUCOSE, CAPILLARY
Glucose-Capillary: 112 mg/dL — ABNORMAL HIGH (ref 70–99)
Glucose-Capillary: 149 mg/dL — ABNORMAL HIGH (ref 70–99)
Glucose-Capillary: 141 mg/dL — ABNORMAL HIGH (ref 70–99)

## 2021-09-06 MED ORDER — LIDOCAINE HCL 1 % IJ SOLN
INTRAMUSCULAR | Status: DC | PRN
  Administered 2021-09-06: 15 mL

## 2021-09-06 MED ORDER — FENTANYL CITRATE (PF) 100 MCG/2ML IJ SOLN
INTRAMUSCULAR | Status: DC | PRN
  Administered 2021-09-06 (×2): 50 ug via INTRAVENOUS

## 2021-09-06 MED ORDER — FENTANYL CITRATE (PF) 100 MCG/2ML IJ SOLN
INTRAMUSCULAR | Status: AC
  Filled 2021-09-06: qty 2

## 2021-09-06 MED ORDER — LIDOCAINE HCL 1 % IJ SOLN
INTRAMUSCULAR | Status: AC
  Filled 2021-09-06: qty 20

## 2021-09-06 NOTE — Progress Notes (Signed)
ANTICOAGULATION CONSULT NOTE - Follow Up Consult  Pharmacy Consult for heparin Indication:  SVC occlusion  Labs: Recent Labs    09/03/21 1546 09/04/21 1344 09/04/21 2228 09/05/21 0614 09/06/21 0120  HGB 12.7  --   --  12.1 11.9*  HCT 42.7  --   --  37.9 36.4  PLT 161  --   --  191 196  APTT  --  29  --   --   --   LABPROT  --  13.6  --   --   --   INR  --  1.0  --   --   --   HEPARINUNFRC  --   --  0.37 0.48 0.37  CREATININE 1.12*  --   --   --   --      Assessment: 44yo female therapeutic on heparin with most recent heparin level at 0.37; no infusion issues or signs of bleeding per RN. CBC stable, will continue to monitor. Currently there are no plans for DOAC use at discharge due to risk for ICH with seizures. Family does not wish to administer lovenox. Will continue to follow plans for discharge.  Goal of Therapy:  Heparin level 0.3-0.7 units/ml   Plan:  Continue heparin at 1600 units/hr  Daily heparin level and CBC  Thank you for allowing pharmacy to participate in this patient's care.  Reatha Harps, PharmD PGY1 Pharmacy Resident 09/06/2021 7:39 AM Check [email or web address] for unit specific pharmacy number

## 2021-09-06 NOTE — Progress Notes (Signed)
Per Dr. Anselm Pancoast plan to remove left side port today as this is likely just worsening her symptoms and could potentially be risking further thrombus formation - will attempt with lidocaine only but will hold further PO intake in case patient cannot tolerate.  Discussed port removal with patient's mother Ames Coupe via phone today who gives verbal consent. She is asking to be kept updated regarding central venogram with angioplasty/stent placement/new port placement - I will have our schedulers reach out to her as soon as we are able to obtain the appropriate equipment to move forward, I have also placed our contact information in patient's AVS if she has any questions or concerns in the interim.  IR will call for patient when ready, heparin gtt to be held x 1 hour prior to procedure.  Candiss Norse, PA-C

## 2021-09-06 NOTE — Sedation Documentation (Signed)
Pt are breakfast fentanyl only

## 2021-09-06 NOTE — Progress Notes (Signed)
PROGRESS NOTE    Jane Edwards  [id number] DOB: [birth year] DOA: 09/03/2021 PCP: Leonides Sake, MD    Brief Narrative:  45 year old female with extensive medical issues including morbid obesity, hypertension, type 2 diabetes on diet control, seizure disorder, [employer]'s [employer] with chronic iron-deficiency anemia on chronic iron infusions every 3 months, dysfunctional implanted port, dysfunctional PICC line presented with swelling of the right arm extending to right neck and right side of the face.  Recently diagnosed with right-sided SVC syndrome with occlusion of the SVC at the junction of brachiocephalic veins.  Presented to the ER from cardiology office with significant swelling and discomfort.  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 -Admitted with significant symptoms.  Treated with empiric heparin with plan to change to oral anticoagulation on discharge but family disagreed.  Continue heparin while in the hospital.  No anticoagulation on discharge. -Interventional radiology planning for removal of the port with venogram and balloon angioplasty and stenting, logistically not possible so waiting. -We will check echocardiogram and empirically treat with IV diuretics to see if she has any improvement on her overall generalized swelling.  Essential hypertension: Blood pressure stable on home medications.  Type 2 diabetes: Well-controlled.  Diet controlled.  No indication for treatment.  Seizure disorder: On Tegretol  and topiramate.  Continue.  Reportedly pseudoseizure.  Chronic iron-deficiency anemia: Stable.  Sleep apnea: Uses CPAP at night.   DVT prophylaxis: SCDs Start: 09/04/21 0417   Code Status: Full code Family Communication: Mother and father at bedside 9/17. Disposition Plan: Status is: Inpatient  Remains inpatient appropriate because:Inpatient level of care appropriate due to severity of illness  Dispo: The patient is from: Home              Anticipated d/c is to: Home              Patient currently is not medically stable to d/c.   Difficult to place patient No         Consultants:  Interventional radiology  Procedures:  None  Antimicrobials:  None   Subjective: Patient seen and examined.  Sleeping in bed and eating breakfast.  Patient herself denies any complaints.  She was not sure how she is feeling. Denies any difficulty breathing or arm pain.  Objective: Vitals:   09/05/21 2212 09/05/21 2350 09/06/21 0431 09/06/21 0754  BP:  122/82 120/83 131/76  Pulse: 71 73 67 66  Resp: '18 20 17 16  '$ Temp:  98.5 F (36.9 C) 97.9 F (36.6 C) 97.9 F (36.6 C)  TempSrc:  Oral Oral Oral  SpO2: 97% 98% 98% 100%  Weight:      Height:        Intake/Output Summary (Last 24 hours) at 09/06/2021 1120 Last data filed at 09/06/2021 1100 Gross per 24 hour  Intake 294.46 ml  Output 4200 ml  Net -3905.54 ml   Filed Weights   09/04/21 1300  Weight: (!) 147.7 kg    Examination:  General exam: Chronically sick looking.  Morbidly obese.  Looks comfortable on room air.  Not in any distress. Respiratory system: No added sounds. Cardiovascular system: S1 & S2 heard, RRR.  Gastrointestinal system: Obese and pendulous.  Bowel sounds present. Central nervous system: Alert and oriented. No focal neurological deficits. Extremities: Symmetric 5 x 5 power. Obese , no pitting edema. Right hand is slightly more puffier than left hand.    Data Reviewed: I have personally reviewed  following labs and imaging studies  CBC: Recent Labs  Lab 09/03/21 1546 09/05/21 0614 09/06/21 0120  WBC 5.9 4.8 4.7  NEUTROABS 4.6  --   --   HGB 12.7 12.1 11.9*  HCT 42.7 37.9 36.4  MCV 89.3 83.1 83.1  PLT 161 191 [id number]   Basic Metabolic Panel: Recent Labs  Lab 09/03/21 1546  NA 137  K 3.9  CL 105  CO2 20*  GLUCOSE 125*  BUN 6  CREATININE 1.12*  CALCIUM 8.7*   GFR: Estimated Creatinine Clearance: 90.6 mL/min (A) (by C-G formula based on SCr of 1.12 mg/dL (H)). Liver Function Tests: Recent Labs  Lab 09/03/21 1546  AST 76*  ALT 69*  ALKPHOS 93  BILITOT 0.6  PROT 6.7  ALBUMIN 3.6   No results for input(s): LIPASE, AMYLASE in the last 168 hours. No results for input(s): AMMONIA in the last 168 hours. Coagulation Profile: Recent Labs  Lab 09/04/21 1344  INR 1.0   Cardiac Enzymes: No results for input(s): CKTOTAL, CKMB, CKMBINDEX, TROPONINI in the last 168 hours. BNP (last 3 results) No results for input(s): PROBNP in the last 8760 hours. HbA1C: No results for input(s): HGBA1C in the last 72 hours. CBG: Recent Labs  Lab 09/05/21 0619 09/05/21 1057 09/05/21 1714 09/05/21 2136 09/06/21 0607  GLUCAP 117* 133* 145* 141* 112*   Lipid Profile: No results for input(s): CHOL, HDL, LDLCALC, TRIG, CHOLHDL, LDLDIRECT in the last 72 hours. Thyroid Function Tests: No results for input(s): TSH, T4TOTAL, FREET4, T3FREE, THYROIDAB in the last 72 hours. Anemia Panel: No results for input(s): VITAMINB12, FOLATE, FERRITIN, TIBC, IRON, RETICCTPCT in the last 72 hours. Sepsis Labs: No results for input(s): PROCALCITON, LATICACIDVEN in the last 168 hours.  Recent Results (from the past 240 hour(s))  Resp Panel by RT-PCR (Flu A&B, Covid) Nasopharyngeal Swab     Status: None   Collection Time: 09/04/21  2:06 AM   Specimen: Nasopharyngeal Swab; Nasopharyngeal(NP) swabs in vial transport medium  Result Value Ref Range Status   SARS Coronavirus 2 by RT PCR NEGATIVE  NEGATIVE Final    Comment: (NOTE) SARS-CoV-2 target nucleic acids are NOT DETECTED.  The SARS-CoV-2 RNA is generally detectable in upper respiratory specimens during the acute phase of infection. The lowest concentration of SARS-CoV-2 viral copies this assay can detect is 138 copies/mL. A negative result does not preclude SARS-Cov-2 infection and should not be used as the sole basis for treatment or other patient management decisions. A negative result may occur with  improper specimen collection/handling, submission of specimen other than nasopharyngeal swab, presence of viral mutation(s) within the areas targeted by this assay, and inadequate number of viral copies(<138 copies/mL). A negative result must be combined with clinical observations, patient history, and epidemiological information. The expected result is Negative.  Fact Sheet for Patients:  EntrepreneurPulse.com.au  Fact Sheet for Healthcare Providers:  IncredibleEmployment.be  This test is no t yet approved or cleared by the [place] FDA and  has been authorized for detection and/or diagnosis of SARS-CoV-2 by FDA under an Emergency Use Authorization (EUA). This EUA will remain  in effect (  meaning this test can be used) for the duration of the COVID-19 declaration under Section 564(b)(1) of the Act, 21 U.S.C.section 360bbb-3(b)(1), unless the authorization is terminated  or revoked sooner.       Influenza A by PCR NEGATIVE NEGATIVE Final   Influenza B by PCR NEGATIVE NEGATIVE Final    Comment: (NOTE) The Xpert Xpress SARS-CoV-2/FLU/RSV plus assay is intended as an aid in the diagnosis of influenza from Nasopharyngeal swab specimens and should not be used as a sole basis for treatment. Nasal washings and aspirates are unacceptable for Xpert Xpress SARS-CoV-2/FLU/RSV testing.  Fact Sheet for Patients: EntrepreneurPulse.com.au  Fact Sheet for Healthcare  Providers: IncredibleEmployment.be  This test is not yet approved or cleared by the [place] FDA and has been authorized for detection and/or diagnosis of SARS-CoV-2 by FDA under an Emergency Use Authorization (EUA). This EUA will remain in effect (meaning this test can be used) for the duration of the COVID-19 declaration under Section 564(b)(1) of the Act, 21 U.S.C. section 360bbb-3(b)(1), unless the authorization is terminated or revoked.  Performed at [facility], [place] [address] [place], [place] [address]          Radiology Studies: IR PICC REPLACEMENT RIGHT INC IMG GUIDE  Result Date: 09/04/2021 INDICATION: Patient with history of SVC syndrome with known occlusion of the superior aspect of the SVC at the junction of the bilateral brachiocephalic veins as well as multiple hypertrophied mediastinal venous collaterals with non occlusive thrombus about the central aspect of the left subclavian vein approach port a catheter based on CT venogram chest 08/21/21. Patient has right upper extremity PICC not placed in IR which was noted to be retracted into the subclavian vein and does not aspirate. Request for exchange of existing right upper extremity PICC for ongoing venous access. EXAM: RIGHT UPPER EXTREMITY PICC LINE EXCHANGE WITH FLUOROSCOPIC GUIDANCE MEDICATIONS: None. ANESTHESIA/SEDATION: None. FLUOROSCOPY TIME:  Fluoroscopy Time: 3 minutes 54 seconds (83 mGy). COMPLICATIONS: None immediate. PROCEDURE: The patient was advised of the possible risks and complications and agreed to undergo the procedure. The patient was then brought to the angiographic suite for the procedure. The right arm was prepped with chlorhexidine, draped in the usual sterile fashion using maximum barrier technique (cap and mask, sterile gown, sterile gloves, large sterile sheet, hand hygiene and cutaneous antisepsis) and infiltrated locally with 1% Lidocaine. A 0.018 wire was introduced in  to the vein via the existing PICC line, the wire was noted to preferentially enter the left subclavian vein and was unable to be passed into the lower SVC/right atrium. After multiple attempts the wire was able to be advanced to the upper portion of the SVC near the tip of the existing left port. Over this, a 5 [place] single lumen power-injectable PICC was advanced to the upper portion of the SVC. Fluoroscopy during the procedure and fluoro spot radiograph confirms appropriate catheter position. The catheter was flushed, aspirated, heparin locked and covered with a sterile dressing. Catheter length: 38 cm IMPRESSION: Successful right arm Power PICC line exchange with fluoroscopic guidance. The catheter tip terminates beyond the right axillary line within the upper portion of the SVC. Read by Candiss Norse, PA-C Electronically Signed   By: Aletta Edouard M.D.   On: 09/04/2021 13:53        Scheduled Meds:  amLODipine  10 mg Oral Daily   carbamazepine  300 mg Oral BID   carvedilol  25 mg Oral BID WC   Chlorhexidine Gluconate Cloth  6 each Topical Daily   desvenlafaxine  100 mg Oral Daily   ferrous sulfate  325 mg Oral Q breakfast   furosemide  40 mg Intravenous Q12H   hydrochlorothiazide  25 mg Oral Daily   magnesium oxide  400 mg Oral Daily   potassium chloride  20 mEq Oral BID   topiramate ER  300 mg Oral Daily   traZODone  50-100 mg Oral QHS   Continuous Infusions:  heparin Stopped (09/06/21 1042)     LOS: 2 days    Time spent: 30 minutes    Barb Merino, MD [facility] Pager [phone]

## 2021-09-06 NOTE — Progress Notes (Signed)
  Echocardiogram 2D Echocardiogram has been performed.  Merrie Roof F 09/06/2021, 2:43 PM

## 2021-09-06 NOTE — Procedures (Signed)
Interventional Radiology Procedure:   Indications: SVC syndrome with known SVC occlusion secondary to central venous catheters.  Existing left chest port is non-functioning.    Procedure: Removal of left chest port  Findings: Complete removal of left chest port.  Complications: No immediate complications noted.     EBL: Minimal  Plan: Keep incision dry for 24 hours.  Do not submerge the incision for at least 1 week.    Jane Edwards [name], MD  Pager: [phone]

## 2021-09-07 ENCOUNTER — Encounter: Payer: Self-pay | Admitting: Oncology

## 2021-09-07 ENCOUNTER — Encounter (HOSPITAL_COMMUNITY): Payer: Self-pay | Admitting: Interventional Radiology

## 2021-09-07 ENCOUNTER — Other Ambulatory Visit: Payer: Self-pay | Admitting: Radiology

## 2021-09-07 ENCOUNTER — Other Ambulatory Visit: Payer: Self-pay

## 2021-09-07 LAB — BASIC METABOLIC PANEL
Anion gap: 12 (ref 5–15)
BUN: 13 mg/dL (ref 6–20)
CO2: 26 mmol/L (ref 22–32)
Calcium: 9.1 mg/dL (ref 8.9–10.3)
Chloride: 95 mmol/L — ABNORMAL LOW (ref 98–111)
Creatinine, Ser: 0.94 mg/dL (ref 0.44–1.00)
GFR, Estimated: >60 mL/min (ref >60)
Glucose, Bld: 133 mg/dL — ABNORMAL HIGH (ref 70–99)
Potassium: 3.3 mmol/L — ABNORMAL LOW (ref 3.5–5.1)
Sodium: 133 mmol/L — ABNORMAL LOW (ref 135–145)

## 2021-09-07 LAB — CBC
HCT: 39.5 % (ref 36.0–46.0)
Hemoglobin: 12.5 g/dL (ref 12.0–15.0)
MCH: 26.7 pg (ref 26.0–34.0)
MCHC: 31.6 g/dL (ref 30.0–36.0)
MCV: 84.2 fL (ref 80.0–100.0)
Platelets: 171 10*3/uL (ref 150–400)
RBC: 4.69 MIL/uL (ref 3.87–5.11)
RDW: 17.5 % — ABNORMAL HIGH (ref 11.5–15.5)
WBC: 5 10*3/uL (ref 4.0–10.5)
nRBC: 0 % (ref 0.0–0.2)

## 2021-09-07 LAB — HEPARIN LEVEL (UNFRACTIONATED): Heparin Unfractionated: 0.45 IU/mL (ref 0.30–0.70)

## 2021-09-07 LAB — GLUCOSE, CAPILLARY
Glucose-Capillary: 109 mg/dL — ABNORMAL HIGH (ref 70–99)
Glucose-Capillary: 166 mg/dL — ABNORMAL HIGH (ref 70–99)

## 2021-09-07 LAB — MAGNESIUM: Magnesium: 1.7 mg/dL (ref 1.7–2.4)

## 2021-09-07 LAB — PHOSPHORUS: Phosphorus: 4.2 mg/dL (ref 2.5–4.6)

## 2021-09-07 MED ORDER — FUROSEMIDE 40 MG PO TABS: 40.0000 mg | ORAL_TABLET | Freq: Two times a day (BID) | ORAL | 0 refills | Status: AC

## 2021-09-07 MED ORDER — POTASSIUM CHLORIDE CRYS ER 20 MEQ PO TBCR
40.0000 meq | EXTENDED_RELEASE_TABLET | Freq: Two times a day (BID) | ORAL | Status: DC
  Administered 2021-09-07: 40 meq via ORAL
  Filled 2021-09-07: qty 2

## 2021-09-07 MED ORDER — HEPARIN SOD (PORK) LOCK FLUSH 100 UNIT/ML IV SOLN
250.0000 [IU] | INTRAVENOUS | Status: AC | PRN
  Administered 2021-09-07: 250 [IU]
  Filled 2021-09-07: qty 2.5

## 2021-09-07 MED ORDER — CONTRAST ALLERGY PREMED PACK 3 X 50 MG & 1 X 50 MG PO KIT: 50.0000 mg | PACK | ORAL | 0 refills | Status: AC
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
## 2021-09-07 NOTE — Progress Notes (Signed)
PCP - Dr. Lisbeth Ply Cardiologist - denies EKG - 09/02/21 Chest x-ray - 09/03/21 ECHO - 09/07/21 Cardiac Cath -  CPAP -   Fasting Blood Sugar:  <100 Checks Blood Sugar:  1x/day  Blood Thinner Instructions:  Aspirin Instructions:   ERAS Protcol - n/a  COVID TEST- n/a  Anesthesia review: yes  -------------  SDW INSTRUCTIONS:  Your procedure is scheduled on Tuesday 9/20. Please report to [facility] Main Entrance "A" at 1030 A.M., and check in at the Admitting office. Call this number if you have problems the morning of surgery: [phone]   Remember: Do not eat or drink after midnight the night before your surgery   Medications to take morning of surgery with a sip of water include: amLODipine (NORVASC)  carbamazepine (TEGRETOL) carvedilol (COREG) desvenlafaxine (PRISTIQ)   As of today, STOP taking any Aspirin (unless otherwise instructed by your surgeon), Aleve, Naproxen, Ibuprofen, Motrin, Advil, Goody's, BC's, all herbal medications, fish oil, and all vitamins.    The Morning of Surgery Do not wear jewelry, make-up or nail polish. Do not wear lotions, powders, or perfumes/colognes, or deodorant Do not shave 48 hours prior to surgery.   Men may shave face and neck. Do not bring valuables to the hospital. [facility] is not responsible for any belongings or valuables.  If you are a smoker, DO NOT Smoke 24 hours prior to surgery  If you wear a CPAP at night please bring your mask the morning of surgery   Remember that you must have someone to transport you home after your surgery, and remain with you for 24 hours if you are discharged the same day.  Please bring cases for contacts, glasses, hearing aids, dentures or bridgework because it cannot be worn into surgery.   Patients discharged the day of surgery will not be allowed to drive home.   Please shower the NIGHT BEFORE/MORNING OF SURGERY (use antibacterial soap like DIAL soap if possible). Wear comfortable  clothes the morning of surgery. Oral Hygiene is also important to reduce your risk of infection.  Remember - BRUSH YOUR TEETH THE MORNING OF SURGERY WITH YOUR REGULAR TOOTHPASTE  Patient denies shortness of breath, fever, cough and chest pain.

## 2021-09-07 NOTE — Progress Notes (Signed)
ANTICOAGULATION CONSULT NOTE - Follow Up Consult  Pharmacy Consult for heparin Indication:  SVC occlusion  Labs: Recent Labs    09/04/21 1344 09/04/21 2228 09/05/21 0614 09/06/21 0120 09/07/21 0152  HGB  --    < > 12.1 11.9* 12.5  HCT  --   --  37.9 36.4 39.5  PLT  --   --  191 196 171  APTT 29  --   --   --   --   LABPROT 13.6  --   --   --   --   INR 1.0  --   --   --   --   HEPARINUNFRC  --    < > 0.48 0.37 0.45  CREATININE  --   --   --   --  0.94   < > = values in this interval not displayed.     Assessment: 46yo female therapeutic on heparin with today's heparin level at 0.45; no infusion issues or signs of bleeding per RN. CBC stable, will continue to monitor. Currently there are no plans for DOAC use at discharge due to risk for ICH with seizures. Family does not wish to administer lovenox.   CBC stable, no overt bleeding or complications noted.  Goal of Therapy:  Heparin level 0.3-0.7 units/ml   Plan:  Continue heparin at 1600 units/hr  Daily heparin level and CBC  Thank you for allowing pharmacy to participate in this patient's care.  [place] Crane, Roylene Reason, BCCP Clinical Pharmacist  09/07/2021 9:08 AM   [facility] pharmacy phone numbers are listed on [email or web address]

## 2021-09-07 NOTE — Discharge Summary (Signed)
Physician Discharge Summary  Jane Edwards [id number] DOB: 10/14/1976 DOA: 09/03/2021  PCP: Leonides Sake, MD  Admit date: 09/03/2021 Discharge date: 09/07/2021  Admitted From: Home Disposition: Home  Recommendations for Outpatient Follow-up:  Follow up with PCP in 1-2 weeks Interventional radiology to schedule follow-up for you whenever they have logistics available.  Home Health: N/A Equipment/Devices: N/A  Discharge Condition: Stable CODE STATUS: Full code Diet recommendation: Low-salt, low-carb diet  Discharge summary: 45 year old female with extensive medical issues including morbid obesity, hypertension, type 2 diabetes on diet control, seizure disorder thought to be pseudoseizures, Jehovah's Witness with chronic iron-deficiency anemia on chronic iron infusions every 3 months, dysfunctional implanted port, dysfunctional PICC line presented with swelling of the right arm extending to right neck and right side of the face.  Recently diagnosed with right-sided SVC syndrome with occlusion of the SVC at the junction of brachiocephalic veins.  Presented to the ER from cardiology office with significant swelling and discomfort.   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  -Admitted with significant symptoms.  Treated with empiric heparin with plan to change to oral anticoagulation on discharge but family disagreed. -Interventional radiology planning for removal of the port with venogram and balloon angioplasty and stenting, logistically not possible so waiting.  Implanted port was removed on 9/18. -2D echocardiogram was normal.  She did respond very well to diuretic therapy.  We will increase her Lasix from 40 mg daily to 40 mg twice daily.  She is already on potassium and magnesium supplements.  Patient  does have pictures with her about her neck swelling and right hand swelling.  She has significantly improved.  Unsure whether she responded to heparin infusion or Lasix injections but she did clinical improvement.  A detailed discussion was held that patient is possibly stable and can tolerate anticoagulation including oral anticoagulation, however patient and parent disagreed.  She does get episodic seizures at rest and they are reluctant for her to go on blood thinners.  Essential hypertension: Blood pressure stable on home medications.  Type 2 diabetes: Well-controlled.  Diet controlled.  No indication for treatment.  Seizure disorder: On Tegretol and topiramate.  Continue.  Reportedly pseudoseizure.  Chronic iron-deficiency anemia: Stable. Indwelling PICC line was repositioned, radiology is okay for her to go home with PICC line.  Sleep apnea: Uses CPAP at night.  Stable.  Medically stable today.  Going home with family.  IR will call them when they have supplies available.   Discharge Diagnoses:  Principal Problem:   SVC syndrome Active Problems:   Essential hypertension   Iron deficiency anemia due to chronic blood loss   Epileptic spasms, not intractable, without status epilepticus ([facility])   Type 2 diabetes mellitus with other circulatory complications [facility])    Discharge Instructions  Discharge Instructions     Call MD for:  difficulty breathing, headache or visual disturbances   Complete by: As directed    Call MD for:  extreme fatigue   Complete by: As directed    Diet - low sodium heart healthy   Complete by: As directed    Increase activity slowly   Complete by: As directed    No wound care   Complete by: As directed       Allergies as of 09/07/2021       Reactions   Gabapentin  Anaphylaxis   Ibuprofen Anaphylaxis   Iodinated Diagnostic Agents Hives   Latex Hives   Codeine    Dilaudid [hydromorphone]    Caused seizure   Lisinopril    Rocephin  [ceftriaxone]         Medication List     TAKE these medications    amLODipine 10 MG tablet Commonly known as: NORVASC Take 10 mg by mouth daily.   carbamazepine 200 MG tablet Commonly known as: TEGRETOL Take 1.5 tablets (300 mg total) by mouth 2 (two) times daily.   carvedilol 25 MG tablet Commonly known as: COREG Take 25 mg by mouth 2 (two) times daily with a meal.   D3-50 1.25 MG (50000 UT) capsule Generic drug: Cholecalciferol Take 50,000 Units by mouth once a week.   desvenlafaxine 100 MG 24 hr tablet Commonly known as: PRISTIQ Take 100 mg by mouth daily.   ferrous sulfate 325 (65 FE) MG tablet Take 325 mg by mouth daily with breakfast.   furosemide 40 MG tablet Commonly known as: LASIX Take 1 tablet (40 mg total) by mouth 2 (two) times daily. What changed: when to take this   magnesium oxide 400 MG tablet Commonly known as: MAG-OX Take 400 mg by mouth daily.   medroxyPROGESTERone Acetate 150 MG/ML Susy Inject 1 mL into the muscle every 3 (three) months.   potassium chloride 10 MEQ tablet Commonly known as: KLOR-CON Take 20 mEq by mouth 2 (two) times daily.   topiramate ER 150 MG Cs24 sprinkle capsule Commonly known as: QUDEXY XR Take 2 capsules (300 mg total) by mouth daily.   traZODone 100 MG tablet Commonly known as: DESYREL Take 0.5-1 tablets (50-100 mg total) by mouth at bedtime.        Follow-up Information     Markus Daft, MD Follow up.   Specialties: Interventional Radiology, Radiology Why: IR scheduler will call you to arrange for central venogram with angioplasty/stent placement as well removal of current port and placement of new port. Please call with any questions or concerns. Contact information: [address] [place] [address] [phone]                Allergies  Allergen Reactions   Gabapentin Anaphylaxis   Ibuprofen Anaphylaxis   Iodinated Diagnostic Agents Hives   Latex Hives   Codeine     Dilaudid [Hydromorphone]     Caused seizure   Lisinopril    Rocephin [Ceftriaxone]     Consultations: Interventional radiology   Procedures/Studies: DG Chest 2 View  Result Date: 09/03/2021 CLINICAL DATA:  PICC problem EXAM: CHEST - 2 VIEW COMPARISON:  06/05/2021, interventional images 08/10/2021 FINDINGS: Right upper extremity central venous catheter appears slightly withdrawn as compared with spot fluoroscopic image from 08/10/2021. The tip projects over the subclavian region. A left-sided central venous port remains in place with tip over the SVC confluence. No focal opacity or pleural effusion. Normal cardiac size. No pneumothorax IMPRESSION: 1. Right upper extremity central venous catheter tip appears slightly retracted, it projects over the subclavian region, beneath the medial right clavicle 2. Lung fields are clear Electronically Signed   By: Donavan Foil M.D.   On: 09/03/2021 16:51   IR REMOVAL TUN ACCESS W/ PORT W/O FL MOD SED  Result Date: 09/06/2021 INDICATION: 45 year old with SVC syndrome and non functioning left subclavian Port-A-Cath. Port-A-Cath was placed by General surgery at [facility]. EXAM: REMOVAL OF LEFT SUBCLAVIAN PORT-A-CATH WITHOUT FLUOROSCOPY MEDICATIONS: Fentanyl 100 mcg ANESTHESIA/SEDATION: The patient's level  of consciousness and vital signs were monitored continuously by radiology nursing throughout the procedure under my direct supervision. FLUOROSCOPY TIME:  None COMPLICATIONS: None immediate. PROCEDURE: Informed consent was obtained for removal of the Port-A-Cath. Maximal Sterile Barrier Technique was utilized including caps, mask, sterile gowns, sterile gloves, sterile drape, hand hygiene and skin antiseptic. A timeout was performed prior to the initiation of the procedure. The left chest was prepped and draped in a sterile fashion. Lidocaine was utilized for local anesthesia. An incision was made over the previously healed surgical incision. Utilizing  blunt dissection, the port catheter and reservoir were removed from the underlying subcutaneous tissue in their entirety. The pocket was irrigated with a copious amount of sterile normal saline. The subcutaneous tissue was closed with 3-0 Vicryl interrupted subcutaneous stitches. A 4-0 Vicryl running subcuticular stitch was utilized to approximate the skin. Dermabond was applied. IMPRESSION: Successful removal of the left chest Port-A-Cath. Electronically Signed   By: Markus Daft M.D.   On: 09/06/2021 14:06   ECHOCARDIOGRAM COMPLETE  Result Date: 09/06/2021    ECHOCARDIOGRAM REPORT   Patient Name:   TUESDAE SENTMAN Date of Exam: 09/06/2021 Medical Rec #:  [id number]           Height:       61.6 in Accession #:    [id number]          Weight:       325.6 lb Date of Birth:  [birth year]           BSA:          2.341 m Patient Age:    77 years            BP:           131/76 mmHg Patient Gender: F                   HR:           75 bpm. Exam Location:  Inpatient Procedure: 2D Echo, Cardiac Doppler and Color Doppler Indications:     CHF-Acute Diastolic [id number]  History:         Patient has no prior history of Echocardiogram examinations.                  CHF; Risk Factors:Morbid obesity.  Sonographer:     Merrie Roof RDCS Referring Phys:  [id number] Barb Merino Diagnosing Phys: Eleonore Chiquito MD IMPRESSIONS  1. Left ventricular ejection fraction, by estimation, is 60 to 65%. The left ventricle has normal function. The left ventricle has no regional wall motion abnormalities. Left ventricular diastolic parameters were normal.  2. Right ventricular systolic function is normal. The right ventricular size is normal. Tricuspid regurgitation signal is inadequate for assessing PA pressure.  3. The mitral valve is grossly normal. Trivial mitral valve regurgitation. No evidence of mitral stenosis.  4. The aortic valve was not well visualized. Aortic valve regurgitation is not visualized. No aortic stenosis is present. FINDINGS   Left Ventricle: Left ventricular ejection fraction, by estimation, is 60 to 65%. The left ventricle has normal function. The left ventricle has no regional wall motion abnormalities. The left ventricular internal cavity size was normal in size. There is  no left ventricular hypertrophy. Left ventricular diastolic parameters were normal. Right Ventricle: The right ventricular size is normal. No increase in right ventricular wall thickness. Right ventricular systolic function is normal. Tricuspid regurgitation signal is inadequate for assessing PA pressure. Left Atrium: Left atrial size was  normal in size. Right Atrium: Right atrial size was normal in size. Pericardium: Trivial pericardial effusion is present. Mitral Valve: The mitral valve is grossly normal. Trivial mitral valve regurgitation. No evidence of mitral valve stenosis. Tricuspid Valve: The tricuspid valve is grossly normal. Tricuspid valve regurgitation is not demonstrated. No evidence of tricuspid stenosis. Aortic Valve: The aortic valve was not well visualized. Aortic valve regurgitation is not visualized. No aortic stenosis is present. Aortic valve mean gradient measures 4.0 mmHg. Aortic valve peak gradient measures 7.0 mmHg. Aortic valve area, by VTI measures 2.32 cm. Pulmonic Valve: The pulmonic valve was grossly normal. Pulmonic valve regurgitation is not visualized. No evidence of pulmonic stenosis. Aorta: The aortic root is normal in size and structure. Venous: The inferior vena cava was not well visualized. IAS/Shunts: The atrial septum is grossly normal.  LEFT VENTRICLE PLAX 2D LVIDd:         4.50 cm  Diastology LVIDs:         2.90 cm  LV e' medial:    9.68 cm/s LV PW:         1.00 cm  LV E/e' medial:  10.7 LV IVS:        1.00 cm  LV e' lateral:   7.72 cm/s LVOT diam:     2.10 cm  LV E/e' lateral: 13.5 LV SV:         60 LV SV Index:   26 LVOT Area:     3.46 cm  RIGHT VENTRICLE RV Basal diam:  2.90 cm LEFT ATRIUM           Index       RIGHT  ATRIUM           Index LA diam:      3.00 cm 1.28 cm/m  RA Area:     11.50 cm LA Vol (A2C): 24.5 ml 10.47 ml/m RA Volume:   26.40 ml  11.28 ml/m LA Vol (A4C): 41.1 ml 17.56 ml/m  AORTIC VALVE AV Area (Vmax):    2.16 cm AV Area (Vmean):   2.12 cm AV Area (VTI):     2.32 cm AV Vmax:           132.00 cm/s AV Vmean:          87.300 cm/s AV VTI:            0.260 m AV Peak Grad:      7.0 mmHg AV Mean Grad:      4.0 mmHg LVOT Vmax:         82.30 cm/s LVOT Vmean:        53.500 cm/s LVOT VTI:          0.174 m LVOT/AV VTI ratio: 0.67  AORTA Ao Root diam: 3.00 cm MITRAL VALVE MV Area (PHT): 3.60 cm     SHUNTS MV Decel Time: 211 msec     Systemic VTI:  0.17 m MV E velocity: 104.00 cm/s  Systemic Diam: 2.10 cm MV A velocity: 84.90 cm/s MV E/A ratio:  1.22 Eleonore Chiquito MD Electronically signed by Eleonore Chiquito MD Signature Date/Time: 09/06/2021/4:33:28 PM    Final (Updated)    IR PICC REPLACEMENT RIGHT INC IMG GUIDE  Result Date: 09/04/2021 INDICATION: Patient with history of SVC syndrome with known occlusion of the superior aspect of the SVC at the junction of the bilateral brachiocephalic veins as well as multiple hypertrophied mediastinal venous collaterals with non occlusive thrombus about the central aspect of the left subclavian vein  approach port a catheter based on CT venogram chest 08/21/21. Patient has right upper extremity PICC not placed in IR which was noted to be retracted into the subclavian vein and does not aspirate. Request for exchange of existing right upper extremity PICC for ongoing venous access. EXAM: RIGHT UPPER EXTREMITY PICC LINE EXCHANGE WITH FLUOROSCOPIC GUIDANCE MEDICATIONS: None. ANESTHESIA/SEDATION: None. FLUOROSCOPY TIME:  Fluoroscopy Time: 3 minutes 54 seconds (83 mGy). COMPLICATIONS: None immediate. PROCEDURE: The patient was advised of the possible risks and complications and agreed to undergo the procedure. The patient was then brought to the angiographic suite for the procedure.  The right arm was prepped with chlorhexidine, draped in the usual sterile fashion using maximum barrier technique (cap and mask, sterile gown, sterile gloves, large sterile sheet, hand hygiene and cutaneous antisepsis) and infiltrated locally with 1% Lidocaine. A 0.018 wire was introduced in to the vein via the existing PICC line, the wire was noted to preferentially enter the left subclavian vein and was unable to be passed into the lower SVC/right atrium. After multiple attempts the wire was able to be advanced to the upper portion of the SVC near the tip of the existing left port. Over this, a 5 [place] single lumen power-injectable PICC was advanced to the upper portion of the SVC. Fluoroscopy during the procedure and fluoro spot radiograph confirms appropriate catheter position. The catheter was flushed, aspirated, heparin locked and covered with a sterile dressing. Catheter length: 38 cm IMPRESSION: Successful right arm Power PICC line exchange with fluoroscopic guidance. The catheter tip terminates beyond the right axillary line within the upper portion of the SVC. Read by Candiss Norse, PA-C Electronically Signed   By: Aletta Edouard M.D.   On: 09/04/2021 13:53   CT VENOGRAM CHEST  Result Date: 08/21/2021 CLINICAL DATA:  SVC syndrome secondary to multiple previous Port a catheters. EXAM: CTV CHEST WITH CONTRAST TECHNIQUE: Multidetector CT imaging of the chest was performed using the standard protocol during bolus administration of intravenous contrast. Multiplanar CT image reconstructions and MIPs were obtained to evaluate the vascular anatomy. CONTRAST:  115m OMNIPAQUE IOHEXOL 350 MG/ML SOLN Patient with history of contrast allergy though completed her 13 hour steroid prep and received the intravenous contrast without incident. COMPARISON:  Left subclavian vein approach port a catheter injection and right upper extremity PICC line placement - 08/10/2021 Right subclavian approach port a catheter  injection - 04/23/2021 Chest radiograph - 06/05/2021 FINDINGS: Cardiovascular: Stable positioning of right upper extremity approach PICC line with tip position within the right brachiocephalic vein. Stable positioning of left subclavian vein approach port a catheter with tip terminating at the confluence of the left brachiocephalic and SVC. Redemonstrated minimal amount of nonocclusive thrombus about the central aspect of the left subclavian vein approach port a catheter (coronal image 66, series 5). There is short-segment occlusion of the SVC at the junction of the bilateral brachiocephalic veins, regional to its confluence with the azygous vein. This finding is associated with development of several mildly hypertrophied mediastinal venous collaterals as was better demonstrated on central venogram performed 08/10/2021. The mid and central aspects of the SVC appear of normal caliber and widely patent. Normal heart size.  No pericardial effusion. Although this examination was not tailored for the evaluation the pulmonary arteries, there are no discrete filling defects within the central pulmonary arterial tree to suggest central pulmonary embolism. Normal caliber the main pulmonary artery. No evidence of thoracic aortic aneurysm or dissection on this non gated examination. Incidental note is made  of an aortic nipple. Mediastinum/Nodes: No bulky mediastinal, hilar axillary lymphadenopathy. Lungs/Pleura: No focal airspace opacities. No pleural effusion or pneumothorax. The central pulmonary airways appear widely patent. No discrete pulmonary nodules. Upper Abdomen: Limited evaluation of the upper abdomen demonstrates decreased attenuation hepatic parenchyma on this postcontrast examination suggestive hepatic steatosis. There is a minimal amount of focal fatty infiltration adjacent to the fissure for the ligamentum teres. Punctate (approximately 0.7 cm) ill-defined hypoattenuating lesion within the right lobe of the  liver (image 137, series 7 is too small to adequately characterize though given ill-defined borders may represent a hemangioma. Musculoskeletal: There is a minimal amount of subcutaneous stranding at site of previous right anterior chest wall port a catheter removal. No subcutaneous emphysema. Mild skin thickening is noted about the breasts bilaterally and symmetrically. No acute or aggressive osseous abnormalities. Review of the MIP images confirms the above findings. IMPRESSION: 1. Short-segment occlusion of the superior aspect of the SVC at the junction of the bilateral brachiocephalic veins and regional to its confluence with the azygous vein. 2. Redemonstrated multiple hypertrophied mediastinal venous collaterals and nonocclusive thrombus about the central aspect of the left subclavian vein approach port a catheter, similar to port check performed 08/10/2021. 3. Stable positioning of right upper extremity approach PICC line with tip terminating within the central aspect the right brachiocephalic vein. Electronically Signed   By: Sandi Mariscal M.D.   On: 08/21/2021 13:19   (Echo, Carotid, EGD, Colonoscopy, ERCP)    Subjective: Patient seen and examined.  Today she denies any complaints.  She was very happy that her face swelling and hand swelling has improved.  Parents at the bedside.  They feel comfortable taking her home and waiting for radiology procedure.   Discharge Exam: Vitals:   09/07/21 0930 09/07/21 1138  BP: 113/72 122/79  Pulse: 84 84  Resp: 18 16  Temp: 98.3 F (36.8 C) 98.1 F (36.7 C)  SpO2: 97% 96%   Vitals:   09/06/21 2336 09/07/21 0046 09/07/21 0930 09/07/21 1138  BP:  128/80 113/72 122/79  Pulse: 83 70 84 84  Resp:  (!) '21 18 16  '$ Temp:  98.2 F (36.8 C) 98.3 F (36.8 C) 98.1 F (36.7 C)  TempSrc:  Oral Oral Oral  SpO2:  98% 97% 96%  Weight:      Height:        General: Pt is alert, awake, not in acute distress Morbidly obese.  She is on room air.  Comfortable  and talkative. Cardiovascular: RRR, S1/S2 +, no rubs, no gallops Respiratory: CTA bilaterally, no wheezing, no rhonchi Left precordial dressing intact and dry.  No evidence of bleeding. Abdominal: Soft, NT, ND, bowel sounds +, obese and pendulous. Extremities: no edema, no cyanosis  There is no evidence of neck swelling.  Bilateral symmetrical.  Right dorsum of the hand is slightly puffier than the left but no obvious pitting edema.    The results of significant diagnostics from this hospitalization (including imaging, microbiology, ancillary and laboratory) are listed below for reference.     Microbiology: Recent Results (from the past 240 hour(s))  Resp Panel by RT-PCR (Flu A&B, Covid) Nasopharyngeal Swab     Status: None   Collection Time: 09/04/21  2:06 AM   Specimen: Nasopharyngeal Swab; Nasopharyngeal(NP) swabs in vial transport medium  Result Value Ref Range Status   SARS Coronavirus 2 by RT PCR NEGATIVE NEGATIVE Final    Comment: (NOTE) SARS-CoV-2 target nucleic acids are NOT DETECTED.  The SARS-CoV-2 RNA  is generally detectable in upper respiratory specimens during the acute phase of infection. The lowest concentration of SARS-CoV-2 viral copies this assay can detect is 138 copies/mL. A negative result does not preclude SARS-Cov-2 infection and should not be used as the sole basis for treatment or other patient management decisions. A negative result may occur with  improper specimen collection/handling, submission of specimen other than nasopharyngeal swab, presence of viral mutation(s) within the areas targeted by this assay, and inadequate number of viral copies(<138 copies/mL). A negative result must be combined with clinical observations, patient history, and epidemiological information. The expected result is Negative.  Fact Sheet for Patients:  EntrepreneurPulse.com.au  Fact Sheet for Healthcare Providers:   IncredibleEmployment.be  This test is no t yet approved or cleared by the [place] FDA and  has been authorized for detection and/or diagnosis of SARS-CoV-2 by FDA under an Emergency Use Authorization (EUA). This EUA will remain  in effect (meaning this test can be used) for the duration of the COVID-19 declaration under Section 564(b)(1) of the Act, 21 U.S.C.section 360bbb-3(b)(1), unless the authorization is terminated  or revoked sooner.       Influenza A by PCR NEGATIVE NEGATIVE Final   Influenza B by PCR NEGATIVE NEGATIVE Final    Comment: (NOTE) The Xpert Xpress SARS-CoV-2/FLU/RSV plus assay is intended as an aid in the diagnosis of influenza from Nasopharyngeal swab specimens and should not be used as a sole basis for treatment. Nasal washings and aspirates are unacceptable for Xpert Xpress SARS-CoV-2/FLU/RSV testing.  Fact Sheet for Patients: EntrepreneurPulse.com.au  Fact Sheet for Healthcare Providers: IncredibleEmployment.be  This test is not yet approved or cleared by the [place] FDA and has been authorized for detection and/or diagnosis of SARS-CoV-2 by FDA under an Emergency Use Authorization (EUA). This EUA will remain in effect (meaning this test can be used) for the duration of the COVID-19 declaration under Section 564(b)(1) of the Act, 21 U.S.C. section 360bbb-3(b)(1), unless the authorization is terminated or revoked.  Performed at [facility], [place] [address], [place], [place] [address]      Labs: BNP (last 3 results) Recent Labs    09/03/21 1546  BNP [id number]   Basic Metabolic Panel: Recent Labs  Lab 09/03/21 1546 09/07/21 0152  NA 137 133*  K 3.9 3.3*  CL 105 95*  CO2 20* 26  GLUCOSE 125* 133*  BUN 6 13  CREATININE 1.12* 0.94  CALCIUM 8.7* 9.1  MG  --  1.7  PHOS  --  4.2   Liver Function Tests: Recent Labs  Lab 09/03/21 1546  AST 76*  ALT 69*  ALKPHOS 93   BILITOT 0.6  PROT 6.7  ALBUMIN 3.6   No results for input(s): LIPASE, AMYLASE in the last 168 hours. No results for input(s): AMMONIA in the last 168 hours. CBC: Recent Labs  Lab 09/03/21 1546 09/05/21 0614 09/06/21 0120 09/07/21 0152  WBC 5.9 4.8 4.7 5.0  NEUTROABS 4.6  --   --   --   HGB 12.7 12.1 11.9* 12.5  HCT 42.7 37.9 36.4 39.5  MCV 89.3 83.1 83.1 84.2  PLT 161 191 196 171   Cardiac Enzymes: No results for input(s): CKTOTAL, CKMB, CKMBINDEX, TROPONINI in the last 168 hours. BNP: Invalid input(s): POCBNP CBG: Recent Labs  Lab 09/06/21 0607 09/06/21 1558 09/06/21 2124 09/07/21 0634 09/07/21 1136  GLUCAP 112* 149* 141* 109* 166*   D-Dimer No results for input(s): DDIMER in the last 72 hours. Hgb A1c  No results for input(s): HGBA1C in the last 72 hours. Lipid Profile No results for input(s): CHOL, HDL, LDLCALC, TRIG, CHOLHDL, LDLDIRECT in the last 72 hours. Thyroid function studies No results for input(s): TSH, T4TOTAL, T3FREE, THYROIDAB in the last 72 hours.  Invalid input(s): FREET3 Anemia work up No results for input(s): VITAMINB12, FOLATE, FERRITIN, TIBC, IRON, RETICCTPCT in the last 72 hours. Urinalysis    Component Value Date/Time   COLORURINE AMBER (A) 09/04/2021 1429   APPEARANCEUR CLEAR 09/04/2021 1429   LABSPEC 1.015 09/04/2021 1429   PHURINE 6.0 09/04/2021 1429   GLUCOSEU NEGATIVE 09/04/2021 1429   HGBUR TRACE (A) 09/04/2021 1429   BILIRUBINUR SMALL (A) 09/04/2021 1429   KETONESUR NEGATIVE 09/04/2021 1429   PROTEINUR NEGATIVE 09/04/2021 1429   UROBILINOGEN 1.0 04/20/2010 0341   NITRITE NEGATIVE 09/04/2021 1429   LEUKOCYTESUR TRACE (A) 09/04/2021 1429   Sepsis Labs Invalid input(s): PROCALCITONIN,  WBC,  LACTICIDVEN Microbiology Recent Results (from the past 240 hour(s))  Resp Panel by RT-PCR (Flu A&B, Covid) Nasopharyngeal Swab     Status: None   Collection Time: 09/04/21  2:06 AM   Specimen: Nasopharyngeal Swab; Nasopharyngeal(NP)  swabs in vial transport medium  Result Value Ref Range Status   SARS Coronavirus 2 by RT PCR NEGATIVE NEGATIVE Final    Comment: (NOTE) SARS-CoV-2 target nucleic acids are NOT DETECTED.  The SARS-CoV-2 RNA is generally detectable in upper respiratory specimens during the acute phase of infection. The lowest concentration of SARS-CoV-2 viral copies this assay can detect is 138 copies/mL. A negative result does not preclude SARS-Cov-2 infection and should not be used as the sole basis for treatment or other patient management decisions. A negative result may occur with  improper specimen collection/handling, submission of specimen other than nasopharyngeal swab, presence of viral mutation(s) within the areas targeted by this assay, and inadequate number of viral copies(<138 copies/mL). A negative result must be combined with clinical observations, patient history, and epidemiological information. The expected result is Negative.  Fact Sheet for Patients:  EntrepreneurPulse.com.au  Fact Sheet for Healthcare Providers:  IncredibleEmployment.be  This test is no t yet approved or cleared by the [place] FDA and  has been authorized for detection and/or diagnosis of SARS-CoV-2 by FDA under an Emergency Use Authorization (EUA). This EUA will remain  in effect (meaning this test can be used) for the duration of the COVID-19 declaration under Section 564(b)(1) of the Act, 21 U.S.C.section 360bbb-3(b)(1), unless the authorization is terminated  or revoked sooner.       Influenza A by PCR NEGATIVE NEGATIVE Final   Influenza B by PCR NEGATIVE NEGATIVE Final    Comment: (NOTE) The Xpert Xpress SARS-CoV-2/FLU/RSV plus assay is intended as an aid in the diagnosis of influenza from Nasopharyngeal swab specimens and should not be used as a sole basis for treatment. Nasal washings and aspirates are unacceptable for Xpert Xpress  SARS-CoV-2/FLU/RSV testing.  Fact Sheet for Patients: EntrepreneurPulse.com.au  Fact Sheet for Healthcare Providers: IncredibleEmployment.be  This test is not yet approved or cleared by the [place] FDA and has been authorized for detection and/or diagnosis of SARS-CoV-2 by FDA under an Emergency Use Authorization (EUA). This EUA will remain in effect (meaning this test can be used) for the duration of the COVID-19 declaration under Section 564(b)(1) of the Act, 21 U.S.C. section 360bbb-3(b)(1), unless the authorization is terminated or revoked.  Performed at [facility], [place] [address], [place], [place] [address]      Time coordinating  discharge:  32 minutes  SIGNED:   Barb Merino, MD  [facility] 09/07/2021, 1:26 PM

## 2021-09-07 NOTE — Plan of Care (Signed)
  Problem: Education: Goal: Knowledge of General Education information will improve Description: Including pain rating scale, medication(s)/side effects and non-pharmacologic comfort measures Outcome: Completed/Met

## 2021-09-08 ENCOUNTER — Encounter (HOSPITAL_COMMUNITY): Payer: Self-pay | Admitting: Interventional Radiology

## 2021-09-08 ENCOUNTER — Other Ambulatory Visit: Payer: Self-pay

## 2021-09-08 ENCOUNTER — Ambulatory Visit (HOSPITAL_COMMUNITY): Payer: Medicare Other | Admitting: Anesthesiology

## 2021-09-08 ENCOUNTER — Ambulatory Visit (HOSPITAL_COMMUNITY)
Admission: RE | Admit: 2021-09-08 | Discharge: 2021-09-08 | Disposition: A | Discharge status: Home / Self Care | Payer: Medicare Other | Source: Ambulatory Visit | Attending: Physician Assistant | Admitting: Physician Assistant

## 2021-09-08 ENCOUNTER — Observation Stay (HOSPITAL_COMMUNITY)
Admission: RE | Admit: 2021-09-08 | Discharge: 2021-09-09 | Disposition: A | Discharge status: Home / Self Care | Payer: Medicare Other | Attending: Interventional Radiology | Admitting: Interventional Radiology

## 2021-09-08 ENCOUNTER — Telehealth: Payer: Self-pay | Admitting: Oncology

## 2021-09-08 ENCOUNTER — Encounter (HOSPITAL_COMMUNITY)
Admission: RE | Disposition: A | Discharge status: Home / Self Care | Payer: Self-pay | Source: Home / Self Care | Attending: Interventional Radiology

## 2021-09-08 DIAGNOSIS — Z79899 Other long term (current) drug therapy: Secondary | ICD-10-CM | POA: Diagnosis not present

## 2021-09-08 DIAGNOSIS — Z881 Allergy status to other antibiotic agents status: Secondary | ICD-10-CM | POA: Insufficient documentation

## 2021-09-08 DIAGNOSIS — I871 Compression of vein: Secondary | ICD-10-CM | POA: Diagnosis not present

## 2021-09-08 DIAGNOSIS — Z886 Allergy status to analgesic agent status: Secondary | ICD-10-CM | POA: Diagnosis not present

## 2021-09-08 DIAGNOSIS — Z888 Allergy status to other drugs, medicaments and biological substances status: Secondary | ICD-10-CM | POA: Diagnosis not present

## 2021-09-08 DIAGNOSIS — E119 Type 2 diabetes mellitus without complications: Secondary | ICD-10-CM | POA: Insufficient documentation

## 2021-09-08 DIAGNOSIS — D509 Iron deficiency anemia, unspecified: Secondary | ICD-10-CM | POA: Insufficient documentation

## 2021-09-08 DIAGNOSIS — Z91041 Radiographic dye allergy status: Secondary | ICD-10-CM | POA: Diagnosis not present

## 2021-09-08 DIAGNOSIS — I10 Essential (primary) hypertension: Secondary | ICD-10-CM | POA: Diagnosis not present

## 2021-09-08 DIAGNOSIS — E7800 Pure hypercholesterolemia, unspecified: Secondary | ICD-10-CM | POA: Diagnosis not present

## 2021-09-08 DIAGNOSIS — Z885 Allergy status to narcotic agent status: Secondary | ICD-10-CM | POA: Diagnosis not present

## 2021-09-08 DIAGNOSIS — J45909 Unspecified asthma, uncomplicated: Secondary | ICD-10-CM | POA: Insufficient documentation

## 2021-09-08 DIAGNOSIS — Z452 Encounter for adjustment and management of vascular access device: Secondary | ICD-10-CM | POA: Diagnosis not present

## 2021-09-08 DIAGNOSIS — I82210 Acute embolism and thrombosis of superior vena cava: Secondary | ICD-10-CM | POA: Diagnosis not present

## 2021-09-08 HISTORY — PX: IR US GUIDE VASC ACCESS LEFT: IMG2389

## 2021-09-08 HISTORY — PX: RADIOLOGY WITH ANESTHESIA: SHX6223

## 2021-09-08 HISTORY — PX: IR VENOCAVAGRAM SVC: IMG679

## 2021-09-08 HISTORY — PX: IR PTA VENOUS EXCEPT DIALYSIS CIRCUIT: IMG6126

## 2021-09-08 HISTORY — PX: IR US GUIDE VASC ACCESS RIGHT: IMG2390

## 2021-09-08 LAB — GLUCOSE, CAPILLARY
Glucose-Capillary: 194 mg/dL — ABNORMAL HIGH (ref 70–99)
Glucose-Capillary: 150 mg/dL — ABNORMAL HIGH (ref 70–99)
Glucose-Capillary: 130 mg/dL — ABNORMAL HIGH (ref 70–99)

## 2021-09-08 LAB — NO BLOOD PRODUCTS

## 2021-09-08 SURGERY — RADIOLOGY WITH ANESTHESIA
Anesthesia: General

## 2021-09-08 MED ORDER — CARBAMAZEPINE 200 MG PO TABS
300.0000 mg | ORAL_TABLET | Freq: Two times a day (BID) | ORAL | Status: DC
  Administered 2021-09-08 – 2021-09-09 (×2): 300 mg via ORAL
  Filled 2021-09-08 (×3): qty 1.5

## 2021-09-08 MED ORDER — DEXAMETHASONE SODIUM PHOSPHATE 10 MG/ML IJ SOLN
INTRAMUSCULAR | Status: DC | PRN
  Administered 2021-09-08: 10 mg via INTRAVENOUS

## 2021-09-08 MED ORDER — CARVEDILOL 25 MG PO TABS
25.0000 mg | ORAL_TABLET | Freq: Two times a day (BID) | ORAL | Status: DC
  Administered 2021-09-09: 25 mg via ORAL
  Filled 2021-09-08 (×2): qty 1

## 2021-09-08 MED ORDER — IOHEXOL 350 MG/ML SOLN
100.0000 mL | Freq: Once | INTRAVENOUS | Status: AC | PRN
  Administered 2021-09-08: 80 mL via INTRAVENOUS

## 2021-09-08 MED ORDER — MIDAZOLAM HCL 2 MG/2ML IJ SOLN
INTRAMUSCULAR | Status: DC | PRN
  Administered 2021-09-08: 2 mg via INTRAVENOUS

## 2021-09-08 MED ORDER — TOPIRAMATE 25 MG PO TABS: 150.0000 mg | ORAL_TABLET | Freq: Two times a day (BID) | ORAL | Status: DC

## 2021-09-08 MED ORDER — RIVAROXABAN 20 MG PO TABS
20.0000 mg | ORAL_TABLET | Freq: Every day | ORAL | Status: DC
Start: 2021-09-30 — End: 2021-09-08

## 2021-09-08 MED ORDER — PROPOFOL 10 MG/ML IV BOLUS
INTRAVENOUS | Status: DC | PRN
  Administered 2021-09-08: 200 mg via INTRAVENOUS

## 2021-09-08 MED ORDER — HEPARIN SOD (PORK) LOCK FLUSH 100 UNIT/ML IV SOLN
INTRAVENOUS | Status: AC
  Administered 2021-09-08: 12 [IU]
  Filled 2021-09-08: qty 5

## 2021-09-08 MED ORDER — FENTANYL CITRATE (PF) 250 MCG/5ML IJ SOLN
INTRAMUSCULAR | Status: DC | PRN
  Administered 2021-09-08: 100 ug via INTRAVENOUS

## 2021-09-08 MED ORDER — CHOLECALCIFEROL 1.25 MG (50000 UT) PO CAPS: 50000.0000 [IU] | ORAL_CAPSULE | ORAL | Status: DC

## 2021-09-08 MED ORDER — HEPARIN SOD (PORK) LOCK FLUSH 100 UNIT/ML IV SOLN
INTRAVENOUS | Status: DC | PRN
  Administered 2021-09-08: 500 [IU] via INTRAVENOUS

## 2021-09-08 MED ORDER — POTASSIUM CHLORIDE CRYS ER 20 MEQ PO TBCR
20.0000 meq | EXTENDED_RELEASE_TABLET | Freq: Two times a day (BID) | ORAL | Status: DC
  Administered 2021-09-08 – 2021-09-09 (×2): 20 meq via ORAL
  Filled 2021-09-08 (×2): qty 1

## 2021-09-08 MED ORDER — ONDANSETRON HCL 4 MG/2ML IJ SOLN
INTRAMUSCULAR | Status: DC | PRN
  Administered 2021-09-08: 4 mg via INTRAVENOUS

## 2021-09-08 MED ORDER — AMLODIPINE BESYLATE 10 MG PO TABS
10.0000 mg | ORAL_TABLET | Freq: Every day | ORAL | Status: DC
  Administered 2021-09-09: 10 mg via ORAL
  Filled 2021-09-08: qty 1

## 2021-09-08 MED ORDER — FUROSEMIDE 40 MG PO TABS
40.0000 mg | ORAL_TABLET | Freq: Two times a day (BID) | ORAL | Status: DC
  Administered 2021-09-09: 40 mg via ORAL
  Filled 2021-09-08 (×2): qty 1

## 2021-09-08 MED ORDER — VENLAFAXINE HCL ER 150 MG PO CP24
150.0000 mg | ORAL_CAPSULE | Freq: Every day | ORAL | Status: DC
  Administered 2021-09-09: 150 mg via ORAL
  Filled 2021-09-08: qty 1

## 2021-09-08 MED ORDER — PHENYLEPHRINE 40 MCG/ML (10ML) SYRINGE FOR IV PUSH (FOR BLOOD PRESSURE SUPPORT)
PREFILLED_SYRINGE | INTRAVENOUS | Status: DC | PRN
  Administered 2021-09-08: 120 ug via INTRAVENOUS

## 2021-09-08 MED ORDER — IOHEXOL 350 MG/ML SOLN
100.0000 mL | Freq: Once | INTRAVENOUS | Status: AC | PRN
  Administered 2021-09-08: 50 mL via INTRAVENOUS

## 2021-09-08 MED ORDER — TOPIRAMATE ER 100 MG PO SPRINKLE CAP24
300.0000 mg | EXTENDED_RELEASE_CAPSULE | Freq: Every day | ORAL | Status: DC
  Administered 2021-09-09: 300 mg via ORAL
  Filled 2021-09-08: qty 3

## 2021-09-08 MED ORDER — ENOXAPARIN SODIUM 150 MG/ML IJ SOSY
150.0000 mg | PREFILLED_SYRINGE | Freq: Once | INTRAMUSCULAR | Status: DC
  Filled 2021-09-08: qty 1

## 2021-09-08 MED ORDER — TRAZODONE HCL 50 MG PO TABS
50.0000 mg | ORAL_TABLET | Freq: Every day | ORAL | Status: DC
  Administered 2021-09-08: 100 mg via ORAL
  Filled 2021-09-08: qty 2

## 2021-09-08 MED ORDER — MAGNESIUM OXIDE -MG SUPPLEMENT 400 (240 MG) MG PO TABS
400.0000 mg | ORAL_TABLET | Freq: Every day | ORAL | Status: DC
  Administered 2021-09-09: 400 mg via ORAL
  Filled 2021-09-08: qty 1

## 2021-09-08 MED ORDER — ORAL CARE MOUTH RINSE: 15.0000 mL | Freq: Once | OROMUCOSAL | Status: AC

## 2021-09-08 MED ORDER — FERROUS SULFATE 325 (65 FE) MG PO TABS
325.0000 mg | ORAL_TABLET | Freq: Every day | ORAL | Status: DC
  Administered 2021-09-09: 325 mg via ORAL
  Filled 2021-09-08: qty 1

## 2021-09-08 MED ORDER — ROCURONIUM BROMIDE 10 MG/ML (PF) SYRINGE
PREFILLED_SYRINGE | INTRAVENOUS | Status: DC | PRN
  Administered 2021-09-08: 40 mg via INTRAVENOUS
  Administered 2021-09-08: 60 mg via INTRAVENOUS

## 2021-09-08 MED ORDER — IOHEXOL 350 MG/ML SOLN
100.0000 mL | Freq: Once | INTRAVENOUS | Status: AC | PRN
  Administered 2021-09-08: 20 mL via INTRAVENOUS

## 2021-09-08 MED ORDER — RIVAROXABAN 15 MG PO TABS: 15.0000 mg | ORAL_TABLET | Freq: Two times a day (BID) | ORAL | Status: DC

## 2021-09-08 MED ORDER — LACTATED RINGERS IV SOLN: INTRAVENOUS | Status: DC

## 2021-09-08 MED ORDER — PHENYLEPHRINE HCL-NACL 20-0.9 MG/250ML-% IV SOLN
INTRAVENOUS | Status: DC | PRN
  Administered 2021-09-08: 50 ug/min via INTRAVENOUS

## 2021-09-08 MED ORDER — SUGAMMADEX SODIUM 200 MG/2ML IV SOLN
INTRAVENOUS | Status: DC | PRN
  Administered 2021-09-08: 200 mg via INTRAVENOUS

## 2021-09-08 MED ORDER — CHLORHEXIDINE GLUCONATE 0.12 % MT SOLN
15.0000 mL | Freq: Once | OROMUCOSAL | Status: AC
  Administered 2021-09-08: 15 mL via OROMUCOSAL
  Filled 2021-09-08: qty 15

## 2021-09-08 NOTE — Transfer of Care (Signed)
Immediate Anesthesia Transfer of Care Note  Patient: Jane Edwards  Procedure(s) Performed: RADIOLOGY WITH ANESTHESIA -  VENOGRAM  Patient Location: PACU  Anesthesia Type:General  Level of Consciousness: drowsy  Airway & Oxygen Therapy: Patient Spontanous Breathing  Post-op Assessment: Report given to RN and Post -op Vital signs reviewed and stable  Post vital signs: Reviewed and stable  Last Vitals:  Vitals Value Taken Time  BP 133/76 09/08/21 1806  Temp 36.2 C 09/08/21 1806  Pulse 80 09/08/21 1811  Resp 31 09/08/21 1811  SpO2 100 % 09/08/21 1811  Vitals shown include unvalidated device data.  Last Pain:  Vitals:   09/08/21 1806  TempSrc:   PainSc: Asleep      Patients Stated Pain Goal: 0 (53/61/44 3154)  Complications: No notable events documented.

## 2021-09-08 NOTE — Procedures (Signed)
Interventional Radiology Procedure Note  Procedure:  1) Ultrasound guided vascular access of the right greater saphenous vein 2) Ultrasound guided vascular access of the left basilic vein 3) Exchange of indwelling right upper extremity PICC 4) Bilateral upper extremity and central venography 5) Intravascular ultrasound 6) Balloon venoplasty of the right subclavian vein  Findings: Please refer to procedural dictation for full description. 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  Unsuccessful attempts at catheter and wire recanalization and pseudo-sharp recanalization with back end of glide wire.  Successful balloon venoplasty of the right subclavian vein up to 8 mm.  Placement of midline in left basilic vein (5 Fr, 25 cm PICC).    Complications: None immediate  Estimated Blood Loss: 10 mL  Recommendations: Admit to IR for overnight observation. Advance diet as tolerated. Begin Xarelto. Follow up in IR clinic next week to discuss next possible steps including attempted sharp recanalization.    Ruthann Cancer, MD Pager: [phone]

## 2021-09-08 NOTE — Telephone Encounter (Signed)
Patient's mother called to [place] 9/22 Appt due to patient having Port Replacement this week

## 2021-09-08 NOTE — Anesthesia Procedure Notes (Signed)
Procedure Name: Intubation Date/Time: 09/08/2021 2:35 PM Performed by: Lance Coon, CRNA Pre-anesthesia Checklist: Patient identified, Emergency Drugs available, Suction available, Patient being monitored and Timeout performed Patient Re-evaluated:Patient Re-evaluated prior to induction Oxygen Delivery Method: Circle system utilized Preoxygenation: Pre-oxygenation with 100% oxygen Induction Type: IV induction Ventilation: Mask ventilation without difficulty Laryngoscope Size: Miller and 3 Grade View: Grade II Tube type: Oral Tube size: 7.0 mm Number of attempts: 1 Airway Equipment and Method: Stylet Placement Confirmation: ETT inserted through vocal cords under direct vision, positive ETCO2 and breath sounds checked- equal and bilateral Secured at: 21 cm Tube secured with: Tape Dental Injury: Teeth and Oropharynx as per pre-operative assessment

## 2021-09-08 NOTE — Anesthesia Preprocedure Evaluation (Addendum)
Anesthesia Evaluation  Patient identified by MRN, date of birth, ID band Patient awake    Reviewed: Allergy & Precautions, NPO status , Patient's Chart, lab work & pertinent test results  Airway Mallampati: III  TM Distance: >3 FB Neck ROM: Full    Dental  (+) Dental Advisory Given, Teeth Intact   Pulmonary asthma , sleep apnea ,    Pulmonary exam normal breath sounds clear to auscultation       Cardiovascular hypertension, Pt. on medications and Pt. on home beta blockers Normal cardiovascular exam Rhythm:Regular Rate:Normal     Neuro/Psych  Headaches, Seizures -,  PSYCHIATRIC DISORDERS Anxiety Depression    GI/Hepatic negative GI ROS, Neg liver ROS,   Endo/Other  diabetesMorbid obesity  Renal/GU negative Renal ROS     Musculoskeletal negative musculoskeletal ROS (+)   Abdominal (+) + obese,   Peds  Hematology  (+) Blood dyscrasia, anemia ,   Anesthesia Other Findings   Reproductive/Obstetrics                          Anesthesia Physical Anesthesia Plan  ASA: 4  Anesthesia Plan: General   Post-op Pain Management:    Induction: Intravenous  PONV Risk Score and Plan: 3 and Ondansetron, Dexamethasone, Treatment may vary due to age or medical condition and Midazolam  Airway Management Planned: Oral ETT  Additional Equipment: None  Intra-op Plan:   Post-operative Plan: Possible Post-op intubation/ventilation  Informed Consent: I have reviewed the patients History and Physical, chart, labs and discussed the procedure including the risks, benefits and alternatives for the proposed anesthesia with the patient or authorized representative who has indicated his/her understanding and acceptance.     Dental advisory given  Plan Discussed with: CRNA  Anesthesia Plan Comments:        Anesthesia Quick Evaluation

## 2021-09-08 NOTE — H&P (Addendum)
Referring Physician(s): Dr Raelyn Mora  Supervising Physician: Ruthann Cancer  Patient Status:  [facility] OP  Chief Complaint:  HTN; DM; Sz disorder Jehovah Witness-- with Iron deficiency anemia Uses PAC for infusions every 3 mo Known SVC syndrome Presented to ED with facial, extremity and neck swelling Left PAC removed 09/06/21 (placed by [facility] [place])  Subjective:  CT 08/21/21: IMPRESSION: 1. Short-segment occlusion of the superior aspect of the SVC at the junction of the bilateral brachiocephalic veins and regional to its confluence with the azygous vein. 2. Redemonstrated multiple hypertrophied mediastinal venous collaterals and nonocclusive thrombus about the central aspect of the left subclavian vein approach port a catheter, similar to port check performed 08/10/2021. 3. Stable positioning of right upper extremity approach PICC line with tip terminating within the central aspect the right brachiocephalic vein.  Recommendation is to replace Port a cath in IR today To include Superior vena cava recanalization and possible stent placement  Pt is aware of procedure She is allergic to contrast She has been pre medicated for allergy  Allergies: Gabapentin, Ibuprofen, Iodinated diagnostic agents, Latex, Codeine, Dilaudid [hydromorphone], Lisinopril, and Rocephin [ceftriaxone]  Medications: Prior to Admission medications   Medication Sig Start Date End Date Taking? Authorizing Provider  amLODipine (NORVASC) 10 MG tablet Take 10 mg by mouth daily. 01/04/20  Yes [provider]  carbamazepine (TEGRETOL) 200 MG tablet Take 1.5 tablets (300 mg total) by mouth 2 (two) times daily. 02/05/21  Yes Kathrynn Ducking, MD  carvedilol (COREG) 25 MG tablet Take 25 mg by mouth 2 (two) times daily with a meal. 11/06/18  Yes [provider]  Cholecalciferol (D3-50) 1.25 MG (50000 UT) capsule Take 50,000 Units by mouth once a week.   Yes [provider]   desvenlafaxine (PRISTIQ) 100 MG 24 hr tablet Take 100 mg by mouth daily. 07/30/21  Yes [provider]  ferrous sulfate 325 (65 FE) MG tablet Take 325 mg by mouth daily with breakfast.   Yes [provider]  furosemide (LASIX) 40 MG tablet Take 1 tablet (40 mg total) by mouth 2 (two) times daily. 09/07/21 10/07/21 Yes Barb Merino, MD  magnesium oxide (MAG-OX) 400 MG tablet Take 400 mg by mouth daily.   Yes [provider]  medroxyPROGESTERone Acetate 150 MG/ML SUSY Inject 1 mL into the muscle every 3 (three) months. 07/11/21  Yes [provider]  potassium chloride (KLOR-CON) 10 MEQ tablet Take 20 mEq by mouth 2 (two) times daily. 06/28/21  Yes [provider]  predniSONE & diphenhydrAMINE (CONTRAST ALLERGY PREMED PACK) 3 x 50 MG & 1 x 50 MG KIT Take 50 mg by mouth as directed. 09/08/21  Yes Jacqualine Mau, NP  topiramate ER (QUDEXY XR) 150 MG CS24 sprinkle capsule Take 2 capsules (300 mg total) by mouth daily. 02/05/21  Yes Kathrynn Ducking, MD  traZODone (DESYREL) 100 MG tablet Take 0.5-1 tablets (50-100 mg total) by mouth at bedtime. 08/13/21  Yes Arfeen, Arlyce Harman, MD     Vital Signs: BP (!) 120/59   Pulse 89   Temp 98.2 F (36.8 C) (Oral)   Resp 17   Ht '5\' 1"'  (1.549 m)   Wt (!) 320 lb (145.2 kg)   LMP 08/24/2021 (Approximate)   SpO2 96%   BMI 60.46 kg/m   Physical Exam HENT:     Mouth/Throat:     Mouth: Mucous membranes are moist.  Cardiovascular:     Rate and Rhythm: Normal rate and regular  rhythm.     Heart sounds: Normal heart sounds.  Pulmonary:     Breath sounds: Normal breath sounds.  Abdominal:     Palpations: Abdomen is soft.  Musculoskeletal:        General: Normal range of motion.  Skin:    General: Skin is warm.  Neurological:     Mental Status: She is alert and oriented to person, place, and time.  Psychiatric:        Behavior: Behavior normal.    Imaging: IR REMOVAL TUN ACCESS W/ PORT W/O FL MOD  SED  Result Date: 09/06/2021 INDICATION: 45 year old with SVC syndrome and non functioning left subclavian Port-A-Cath. Port-A-Cath was placed by General surgery at [facility]. EXAM: REMOVAL OF LEFT SUBCLAVIAN PORT-A-CATH WITHOUT FLUOROSCOPY MEDICATIONS: Fentanyl 100 mcg ANESTHESIA/SEDATION: The patient's level of consciousness and vital signs were monitored continuously by radiology nursing throughout the procedure under my direct supervision. FLUOROSCOPY TIME:  None COMPLICATIONS: None immediate. PROCEDURE: Informed consent was obtained for removal of the Port-A-Cath. Maximal Sterile Barrier Technique was utilized including caps, mask, sterile gowns, sterile gloves, sterile drape, hand hygiene and skin antiseptic. A timeout was performed prior to the initiation of the procedure. The left chest was prepped and draped in a sterile fashion. Lidocaine was utilized for local anesthesia. An incision was made over the previously healed surgical incision. Utilizing blunt dissection, the port catheter and reservoir were removed from the underlying subcutaneous tissue in their entirety. The pocket was irrigated with a copious amount of sterile normal saline. The subcutaneous tissue was closed with 3-0 Vicryl interrupted subcutaneous stitches. A 4-0 Vicryl running subcuticular stitch was utilized to approximate the skin. Dermabond was applied. IMPRESSION: Successful removal of the left chest Port-A-Cath. Electronically Signed   By: Markus Daft M.D.   On: 09/06/2021 14:06   ECHOCARDIOGRAM COMPLETE  Result Date: 09/06/2021    ECHOCARDIOGRAM REPORT   Patient Name:   TESSI EUSTACHE Date of Exam: 09/06/2021 Medical Rec #:  [id number]           Height:       61.6 in Accession #:    [id number]          Weight:       325.6 lb Date of Birth:  07-31-1976           BSA:          2.341 m Patient Age:    45 years            BP:           131/76 mmHg Patient Gender: F                   HR:           75 bpm. Exam Location:   Inpatient Procedure: 2D Echo, Cardiac Doppler and Color Doppler Indications:     CHF-Acute Diastolic O17.71  History:         Patient has no prior history of Echocardiogram examinations.                  CHF; Risk Factors:Morbid obesity.  Sonographer:     Merrie Roof RDCS Referring Phys:  [id number] Barb Merino Diagnosing Phys: Eleonore Chiquito MD IMPRESSIONS  1. Left ventricular ejection fraction, by estimation, is 60 to 65%. The left ventricle has normal function. The left ventricle has no regional wall motion abnormalities. Left ventricular diastolic parameters were normal.  2. Right ventricular systolic function is normal. The right  ventricular size is normal. Tricuspid regurgitation signal is inadequate for assessing PA pressure.  3. The mitral valve is grossly normal. Trivial mitral valve regurgitation. No evidence of mitral stenosis.  4. The aortic valve was not well visualized. Aortic valve regurgitation is not visualized. No aortic stenosis is present. FINDINGS  Left Ventricle: Left ventricular ejection fraction, by estimation, is 60 to 65%. The left ventricle has normal function. The left ventricle has no regional wall motion abnormalities. The left ventricular internal cavity size was normal in size. There is  no left ventricular hypertrophy. Left ventricular diastolic parameters were normal. Right Ventricle: The right ventricular size is normal. No increase in right ventricular wall thickness. Right ventricular systolic function is normal. Tricuspid regurgitation signal is inadequate for assessing PA pressure. Left Atrium: Left atrial size was normal in size. Right Atrium: Right atrial size was normal in size. Pericardium: Trivial pericardial effusion is present. Mitral Valve: The mitral valve is grossly normal. Trivial mitral valve regurgitation. No evidence of mitral valve stenosis. Tricuspid Valve: The tricuspid valve is grossly normal. Tricuspid valve regurgitation is not demonstrated. No evidence of  tricuspid stenosis. Aortic Valve: The aortic valve was not well visualized. Aortic valve regurgitation is not visualized. No aortic stenosis is present. Aortic valve mean gradient measures 4.0 mmHg. Aortic valve peak gradient measures 7.0 mmHg. Aortic valve area, by VTI measures 2.32 cm. Pulmonic Valve: The pulmonic valve was grossly normal. Pulmonic valve regurgitation is not visualized. No evidence of pulmonic stenosis. Aorta: The aortic root is normal in size and structure. Venous: The inferior vena cava was not well visualized. IAS/Shunts: The atrial septum is grossly normal.  LEFT VENTRICLE PLAX 2D LVIDd:         4.50 cm  Diastology LVIDs:         2.90 cm  LV e' medial:    9.68 cm/s LV PW:         1.00 cm  LV E/e' medial:  10.7 LV IVS:        1.00 cm  LV e' lateral:   7.72 cm/s LVOT diam:     2.10 cm  LV E/e' lateral: 13.5 LV SV:         60 LV SV Index:   26 LVOT Area:     3.46 cm  RIGHT VENTRICLE RV Basal diam:  2.90 cm LEFT ATRIUM           Index       RIGHT ATRIUM           Index LA diam:      3.00 cm 1.28 cm/m  RA Area:     11.50 cm LA Vol (A2C): 24.5 ml 10.47 ml/m RA Volume:   26.40 ml  11.28 ml/m LA Vol (A4C): 41.1 ml 17.56 ml/m  AORTIC VALVE AV Area (Vmax):    2.16 cm AV Area (Vmean):   2.12 cm AV Area (VTI):     2.32 cm AV Vmax:           132.00 cm/s AV Vmean:          87.300 cm/s AV VTI:            0.260 m AV Peak Grad:      7.0 mmHg AV Mean Grad:      4.0 mmHg LVOT Vmax:         82.30 cm/s LVOT Vmean:        53.500 cm/s LVOT VTI:  0.174 m LVOT/AV VTI ratio: 0.67  AORTA Ao Root diam: 3.00 cm MITRAL VALVE MV Area (PHT): 3.60 cm     SHUNTS MV Decel Time: 211 msec     Systemic VTI:  0.17 m MV E velocity: 104.00 cm/s  Systemic Diam: 2.10 cm MV A velocity: 84.90 cm/s MV E/A ratio:  1.22 Eleonore Chiquito MD Electronically signed by Eleonore Chiquito MD Signature Date/Time: 09/06/2021/4:33:28 PM    Final (Updated)    IR PICC REPLACEMENT RIGHT INC IMG GUIDE  Result Date: 09/04/2021 INDICATION:  Patient with history of SVC syndrome with known occlusion of the superior aspect of the SVC at the junction of the bilateral brachiocephalic veins as well as multiple hypertrophied mediastinal venous collaterals with non occlusive thrombus about the central aspect of the left subclavian vein approach port a catheter based on CT venogram chest 08/21/21. Patient has right upper extremity PICC not placed in IR which was noted to be retracted into the subclavian vein and does not aspirate. Request for exchange of existing right upper extremity PICC for ongoing venous access. EXAM: RIGHT UPPER EXTREMITY PICC LINE EXCHANGE WITH FLUOROSCOPIC GUIDANCE MEDICATIONS: None. ANESTHESIA/SEDATION: None. FLUOROSCOPY TIME:  Fluoroscopy Time: 3 minutes 54 seconds (83 mGy). COMPLICATIONS: None immediate. PROCEDURE: The patient was advised of the possible risks and complications and agreed to undergo the procedure. The patient was then brought to the angiographic suite for the procedure. The right arm was prepped with chlorhexidine, draped in the usual sterile fashion using maximum barrier technique (cap and mask, sterile gown, sterile gloves, large sterile sheet, hand hygiene and cutaneous antisepsis) and infiltrated locally with 1% Lidocaine. A 0.018 wire was introduced in to the vein via the existing PICC line, the wire was noted to preferentially enter the left subclavian vein and was unable to be passed into the lower SVC/right atrium. After multiple attempts the wire was able to be advanced to the upper portion of the SVC near the tip of the existing left port. Over this, a 5 [place] single lumen power-injectable PICC was advanced to the upper portion of the SVC. Fluoroscopy during the procedure and fluoro spot radiograph confirms appropriate catheter position. The catheter was flushed, aspirated, heparin locked and covered with a sterile dressing. Catheter length: 38 cm IMPRESSION: Successful right arm Power PICC line exchange with  fluoroscopic guidance. The catheter tip terminates beyond the right axillary line within the upper portion of the SVC. Read by Candiss Norse, PA-C Electronically Signed   By: Aletta Edouard M.D.   On: 09/04/2021 13:53    Labs:  CBC: Recent Labs    09/03/21 1546 09/05/21 0614 09/06/21 0120 09/07/21 0152  WBC 5.9 4.8 4.7 5.0  HGB 12.7 12.1 11.9* 12.5  HCT 42.7 37.9 36.4 39.5  PLT 161 191 196 171    COAGS: Recent Labs    09/04/21 1344  INR 1.0  APTT 29    BMP: Recent Labs    01/14/21 1150 04/07/21 0000 07/31/21 0000 09/03/21 1546 09/07/21 0152  NA 140 136* 138 137 133*  K 4.4 3.6 4.0 3.9 3.3*  CL 109* 98* 107 105 95*  CO2 18* 30* 22 20* 26  GLUCOSE 123*  --   --  125* 133*  BUN '8 6 9 6 13  ' CALCIUM 9.1 9.0 8.9 8.7* 9.1  CREATININE 0.86 0.7 1.0 1.12* 0.94  GFRNONAA 83  --   --  >60 >60  GFRAA 96  --   --   --   --  LIVER FUNCTION TESTS: Recent Labs    01/14/21 1150 04/07/21 0000 07/31/21 0000 09/03/21 1546  BILITOT 0.3  --   --  0.6  AST 24 27 46* 76*  ALT 18 21 32 69*  ALKPHOS 108 137* 101 93  PROT 7.0  --   --  6.7  ALBUMIN 4.1 4.3 4.0 3.6    Assessment and Plan:  Scheduled today for Superior vena cava recanalization and possible stent placement  Re insertion of Port a catheter For continued use for infusions in treatment of Iron deficient anemia  Risks and benefits of image guided port-a-catheter placement with superior vena cava recanalization and possible stent placement-was discussed with the patient including, but not limited to bleeding, infection, pneumothorax, or fibrin sheath development and need for additional procedures.  All of the patient's questions were answered, patient is agreeable to proceed. Consent signed and in chart.    Electronically Signed: Lavonia Drafts, PA-C 09/08/2021, 11:52 AM   I spent a total of 25 Minutes at the the patient's bedside AND on the patient's hospital floor or unit, greater than 50% of which  was counseling/coordinating care for SVC recanalization/stent placement; Port a cath placement    Agree with above.  Plan for bilateral upper extremity and central venograms with central venous recanalization and likely stent placement within the SVC, innominates, right subclavian veins in addition to right IJ port placement in the setting of superior vena cava syndrome.  Plan discussed with the patient and her parents.  Plan for long term anticoagulation after stent placement discussed which her parents expressed hesitancy due to previously being told that anticoagulation shouldn't be given due to seizure history.  Her parents recommended I speak with Dr. Bobby Rumpf (Heme[facility]Onc, [facility]).  I spoke with Dr. Bobby Rumpf and he agrees with proceeding with anticoagulation post-procedure.  As mentioned above, she is a Sales promotion account executive Witness and refuses whole blood products but her mother states that cell saver and non-whole blood alternatives are acceptable if needed.  Ruthann Cancer, MD Pager: [phone]

## 2021-09-08 NOTE — Sedation Documentation (Signed)
Nurse and CRNA transported patient to PACU bay 10 bedside report given

## 2021-09-09 ENCOUNTER — Encounter (HOSPITAL_COMMUNITY): Payer: Self-pay | Admitting: Interventional Radiology

## 2021-09-09 DIAGNOSIS — I10 Essential (primary) hypertension: Secondary | ICD-10-CM | POA: Diagnosis not present

## 2021-09-09 DIAGNOSIS — J45909 Unspecified asthma, uncomplicated: Secondary | ICD-10-CM | POA: Diagnosis not present

## 2021-09-09 DIAGNOSIS — I82210 Acute embolism and thrombosis of superior vena cava: Secondary | ICD-10-CM | POA: Diagnosis not present

## 2021-09-09 DIAGNOSIS — I871 Compression of vein: Secondary | ICD-10-CM | POA: Diagnosis not present

## 2021-09-09 DIAGNOSIS — Z881 Allergy status to other antibiotic agents status: Secondary | ICD-10-CM | POA: Diagnosis not present

## 2021-09-09 DIAGNOSIS — Z79899 Other long term (current) drug therapy: Secondary | ICD-10-CM | POA: Diagnosis not present

## 2021-09-09 DIAGNOSIS — Z885 Allergy status to narcotic agent status: Secondary | ICD-10-CM | POA: Diagnosis not present

## 2021-09-09 DIAGNOSIS — D509 Iron deficiency anemia, unspecified: Secondary | ICD-10-CM | POA: Diagnosis not present

## 2021-09-09 DIAGNOSIS — E119 Type 2 diabetes mellitus without complications: Secondary | ICD-10-CM | POA: Diagnosis not present

## 2021-09-09 DIAGNOSIS — Z888 Allergy status to other drugs, medicaments and biological substances status: Secondary | ICD-10-CM | POA: Diagnosis not present

## 2021-09-09 DIAGNOSIS — Z886 Allergy status to analgesic agent status: Secondary | ICD-10-CM | POA: Diagnosis not present

## 2021-09-09 DIAGNOSIS — Z9889 Other specified postprocedural states: Secondary | ICD-10-CM | POA: Diagnosis not present

## 2021-09-09 DIAGNOSIS — Z91041 Radiographic dye allergy status: Secondary | ICD-10-CM | POA: Diagnosis not present

## 2021-09-09 MED ORDER — HEPARIN SOD (PORK) LOCK FLUSH 100 UNIT/ML IV SOLN
250.0000 [IU] | INTRAVENOUS | Status: AC | PRN
  Administered 2021-09-09: 250 [IU]
  Filled 2021-09-09: qty 2.5

## 2021-09-09 NOTE — Anesthesia Postprocedure Evaluation (Signed)
Anesthesia Post Note  Patient: Jane Edwards  Procedure(s) Performed: RADIOLOGY WITH ANESTHESIA -  VENOGRAM     Patient location during evaluation: PACU Anesthesia Type: General Level of consciousness: sedated and patient cooperative Pain management: pain level controlled Vital Signs Assessment: post-procedure vital signs reviewed and stable Respiratory status: spontaneous breathing Cardiovascular status: stable Anesthetic complications: no   No notable events documented.  Last Vitals:  Vitals:   09/08/21 2312 09/09/21 0811  BP: 133/79 136/70  Pulse: 81 86  Resp: 16 15  Temp: 36.7 C 36.7 C  SpO2: 100% 98%    Last Pain:  Vitals:   09/09/21 0820  TempSrc:   PainSc: 0-No pain                 Nolon Nations

## 2021-09-09 NOTE — Discharge Summary (Signed)
Patient ID: Jane Edwards MRN: [id number] DOB/AGE: 1976-02-20 45 y.o.  Admit date: 09/08/2021 Discharge date: 09/09/2021  Supervising Physician: Corrie Mckusick  Patient Status: [facility] - In-pt  Admission Diagnoses: SVC syndrome  Discharge Diagnoses:  Active Problems:   SVC syndrome   Discharged Condition: good  Hospital Course:  Jane Edwards is a 45 y.o. female History of  HTN, DM, seizures, Jehovah's Witness with iron deficiency anemia. Known SVC syndrome at the junction of the brachiocephalic vein due to multiple port placements and q 3 month iron infusions. Parent presented to the ED at [facility] on 09/04/21 with intermittent and worsening facial extremity, neck swelling and bilateral arm swelling right > left. Patient was discharged 09/07/21 with arrangements for outpatient procedure 09/08/21.  She underwent venogram yesterday which identified occlusion of the proximal SVC and high grade stenosis of the right subclavian vein.  Unsuccessful attempts at catheter and wire recanalization and pseudo-sharp recanalization with back end of glide wire were made. Ultimately she was able to undergo successful balloon venoplasty of the right subclavian vein with left-sided midline PICC placement.  Patient admitted for observation overnight for post-procedure care and ongoing discussions regarding home medications.  She has recovered well. She is eating breakfast with tolerance.  After multiple discussions with patient, MD, and pharmacy, she is not planning to initiate anticoagulation at this time.  She will be discharged home on all her typical home medications.  Per Dr. Serafina Royals, she is to be scheduled for outpatient consultation for further discussion.  She and her parents are aware of plans for following. PICC to remain in place at discharge today. Discharge instructions given.   Discharge Exam: Blood pressure 136/70, pulse 86, temperature 98 F (36.7 C), temperature source Oral, resp. rate 15,  height '5\' 1"'  (1.549 m), weight (!) 320 lb (145.2 kg), last menstrual period 08/24/2021, SpO2 98 %. General appearance: alert, cooperative, and no distress Resp: clear to auscultation bilaterally Cardio: regular rate and rhythm, S1, S2 normal, no murmur, click, rub or gallop GI: soft, non-tender; bowel sounds normal; no masses,  no organomegaly Skin: Skin color, texture, turgor normal. No rashes or lesions.  Procedure sites intact.  Dressings clean and dry. Left-sided PICC to remain in place.   Disposition: Discharge disposition: 01-Home or Self Care       Discharge Instructions     Diet - low sodium heart healthy   Complete by: As directed    Discharge instructions   Complete by: As directed    May remove dressing in 24 hrs. Replace with bandaid/gauze or tape if needed to keep clean and dry.  No additional restrictions.  PICC to remain in place for now.  Schedulers will contact you with date and time of follow-up appointment.   Remove dressing in 24 hours   Complete by: As directed       Allergies as of 09/09/2021       Reactions   Gabapentin Anaphylaxis   Ibuprofen Anaphylaxis   Iodinated Diagnostic Agents Hives   Latex Hives   Codeine    Dilaudid [hydromorphone]    Caused seizure   Lisinopril    Rocephin [ceftriaxone]         Medication List     TAKE these medications    amLODipine 10 MG tablet Commonly known as: NORVASC Take 10 mg by mouth daily.   carbamazepine 200 MG tablet Commonly known as: TEGRETOL Take 1.5 tablets (300 mg total) by mouth 2 (two) times daily.  carvedilol 25 MG tablet Commonly known as: COREG Take 25 mg by mouth 2 (two) times daily with a meal.   Contrast Allergy PreMed Pack 3 x 50 MG & 1 x 50 MG Kit Generic drug: predniSONE & diphenhydrAMINE Take 50 mg by mouth as directed.   D3-50 1.25 MG (50000 UT) capsule Generic drug: Cholecalciferol Take 50,000 Units by mouth once a week.   desvenlafaxine 100 MG 24 hr tablet Commonly  known as: PRISTIQ Take 100 mg by mouth daily.   ferrous sulfate 325 (65 FE) MG tablet Take 325 mg by mouth daily with breakfast.   furosemide 40 MG tablet Commonly known as: LASIX Take 1 tablet (40 mg total) by mouth 2 (two) times daily.   magnesium oxide 400 MG tablet Commonly known as: MAG-OX Take 400 mg by mouth daily.   medroxyPROGESTERone Acetate 150 MG/ML Susy Inject 1 mL into the muscle every 3 (three) months.   potassium chloride 10 MEQ tablet Commonly known as: KLOR-CON Take 20 mEq by mouth 2 (two) times daily.   topiramate ER 150 MG Cs24 sprinkle capsule Commonly known as: QUDEXY XR Take 2 capsules (300 mg total) by mouth daily.   traZODone 100 MG tablet Commonly known as: DESYREL Take 0.5-1 tablets (50-100 mg total) by mouth at bedtime.        Follow-up Information     Suttle, Rosanne Ashing, MD Follow up.   Specialties: Interventional Radiology, Diagnostic Radiology, Radiology Why: Schedulers will contact you with date and time of follow-up Contact information: [address] [place] [id number] [phone]                  Electronically Signed: Docia Barrier, PA 09/09/2021, 10:28 AM   I have spent Greater Than 30 Minutes discharging [place].

## 2021-09-09 NOTE — Progress Notes (Signed)
Printed AVS forms and reviewed with patient and mom at bedside. PICC line capped off by IV team. Patient has all personal belongings at bedside. Tech escorted patient via wheelchair to ride downstairs.

## 2021-09-10 ENCOUNTER — Inpatient Hospital Stay: Payer: Medicare Other

## 2021-09-10 ENCOUNTER — Encounter: Payer: Self-pay | Admitting: Oncology

## 2021-09-11 ENCOUNTER — Inpatient Hospital Stay: Payer: Medicare Other

## 2021-09-11 ENCOUNTER — Other Ambulatory Visit: Payer: Self-pay

## 2021-09-11 DIAGNOSIS — Z95828 Presence of other vascular implants and grafts: Secondary | ICD-10-CM | POA: Diagnosis not present

## 2021-09-11 DIAGNOSIS — I871 Compression of vein: Secondary | ICD-10-CM | POA: Diagnosis not present

## 2021-09-11 DIAGNOSIS — Z7689 Persons encountering health services in other specified circumstances: Secondary | ICD-10-CM | POA: Diagnosis not present

## 2021-09-11 DIAGNOSIS — Z79899 Other long term (current) drug therapy: Secondary | ICD-10-CM | POA: Diagnosis not present

## 2021-09-11 NOTE — Patient Instructions (Signed)
PICC Removal, Adult, Care After This sheet gives you information about how to care for yourself after your procedure. Your health care provider may also give you more specific instructions. If you have problems or questions, contact your health care provider. What can I expect after the procedure? After your procedure, it is common to have: Tenderness or soreness. Redness, swelling, or a scab where the PICC was removed (exit site). Follow these instructions at home: For the first 24 hours after the procedure Keep the bandage (dressing) on the exit site clean and dry. Do not remove the dressing until your health care provider tells you to do so. Check your arm often for signs and symptoms of an infection. Check for: A red streak that spreads away from the dressing. Blood or fluid that you can see on the dressing. More redness or swelling. Do not lift anything heavy or do activities that require great effort until your health care provider says it is okay. You should avoid: Lifting weights. Yard work. Any physical activity with repetitive arm movement. Watch closely for any signs of an air bubble in the vein (air embolism). This is a rare but serious complication. If you have signs of air embolism, call 911 immediately and lie down on your left side to keep the air from moving into the lungs. Signs of an air embolism include: Difficulty breathing. Chest pain. Coughing or wheezing. Skin that is pale, blue, cold, or clammy. Rapid pulse. Rapid breathing. Fainting. After 24 Hours have passed:  Remove your dressing as told by your health care provider. Make sure you wash your hands with soap and water before and after you change the dressing. If soap and water are not available, use hand sanitizer. Return to your normal activities as told by your health care provider. A small scab may develop over the exit site. Do not pick at the scab. When bathing or showering, gently wash the exit site with  soap and water. Pat it dry. Watch for signs of infection, such as: Fever or chills. Swollen glands under the arm. More redness, swelling, or soreness in the arm. Blood, fluid, or pus coming from the exit site. Warmth or a bad smell at the exit site. A red streak spreading away from the exit site. General instructions Take over-the-counter and prescription medicines only as told by your health care provider. Do not take any new medicines without checking with your health care provider first. If you were prescribed an antibiotic medicine, apply or take it as told by your health care provider. Do not stop using the antibiotic even if your condition improves. Keep all follow-up visits as told by your health care provider. This is important. Contact a health care provider if: You have a fever or chills. You have soreness, redness, or swelling on your exit site, and it gets worse. You have swollen glands under your arm. You have any of the following symptoms at your exit site: Blood, fluid, or pus. Unusual warmth. A bad smell. A red streak spreading away from the exit site. Get help right away if: You have numbness or tingling in your fingers, hand, or arm. Your arm looks blue and feels cold. You have signs of an air embolism, such as: Difficulty breathing. Chest pain. Coughing or wheezing. Skin that is pale, blue, cold, or clammy. Rapid pulse. Rapid breathing. Fainting. These symptoms may represent a serious problem that is an emergency. Do not wait to see if the symptoms will go away. Get  medical help right away. Call your local emergency services (911 in the [place] Do not drive yourself to the hospital. Summary After your procedure, it is common to have tenderness or soreness, redness, swelling, or a scab at the exit site. Keep the dressing over the exit site clean and dry. Do not remove the dressing until your health care provider tells you to do so. Do not lift anything heavy or do  activities that require great effort until your health care provider says it is okay. Watch closely for any signs of an air embolism. If you have signs of air embolism, call 911 immediately and lie down on your left side. This information is not intended to replace advice given to you by your health care provider. Make sure you discuss any questions you have with your health care provider. Document Revised: 04/12/2020 Document Reviewed: 04/16/2020 Elsevier Patient Education  2022 [employer].

## 2021-09-11 NOTE — Addendum Note (Signed)
Addended by: Juanetta Beets on: 09/11/2021 03:34 PM   Modules accepted: Orders

## 2021-09-11 NOTE — Progress Notes (Signed)
09/11/2021 at 1215 The patient showed up today without an appointment. The patient stated that he left arm felt funny. She had a PICC line that had been placed to her left upper/inner arm. I checked the site and noticed that the PICC line had been pulled out already. The patient may have accidentally pulled out the line. The mother was not even aware that this had happened. The site was unremarkable, no swelling or bleeding noticed. The line was dangling when I checked the insertion site. The patient felt good. I measured the line at 25cm after Michele Mcalpine and Anderson Malta, RN double checked the length. Brynda Greathouse, PA was informed of the above. The patient does not need it at present. The mother aware of the above and agrees to keep appointment that were made for her doctors. Rosanne Sack, PAC was also informed of the above and agrees

## 2021-09-16 ENCOUNTER — Ambulatory Visit
Admission: RE | Admit: 2021-09-16 | Discharge: 2021-09-16 | Disposition: A | Discharge status: Home / Self Care | Payer: Medicare Other | Source: Ambulatory Visit | Attending: Student | Admitting: Student

## 2021-09-16 ENCOUNTER — Other Ambulatory Visit: Payer: Self-pay

## 2021-09-16 ENCOUNTER — Other Ambulatory Visit: Payer: Self-pay | Admitting: Interventional Radiology

## 2021-09-16 DIAGNOSIS — D250 Submucous leiomyoma of uterus: Secondary | ICD-10-CM

## 2021-09-16 DIAGNOSIS — I871 Compression of vein: Secondary | ICD-10-CM

## 2021-09-16 DIAGNOSIS — I82210 Acute embolism and thrombosis of superior vena cava: Secondary | ICD-10-CM | POA: Diagnosis not present

## 2021-09-16 HISTORY — PX: IR RADIOLOGIST EVAL & MGMT: IMG5224
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
## 2021-09-17 ENCOUNTER — Telehealth (HOSPITAL_COMMUNITY): Payer: Medicare Other | Admitting: Psychiatry

## 2021-09-23 ENCOUNTER — Other Ambulatory Visit: Payer: Medicare Other

## 2021-09-28 ENCOUNTER — Encounter (HOSPITAL_COMMUNITY): Payer: Self-pay | Admitting: Psychiatry

## 2021-09-28 ENCOUNTER — Telehealth (HOSPITAL_BASED_OUTPATIENT_CLINIC_OR_DEPARTMENT_OTHER): Payer: Medicare Other | Admitting: Psychiatry

## 2021-09-28 ENCOUNTER — Other Ambulatory Visit: Payer: Self-pay

## 2021-09-28 VITALS — Wt 320.0 lb

## 2021-09-28 DIAGNOSIS — F330 Major depressive disorder, recurrent, mild: Secondary | ICD-10-CM | POA: Diagnosis not present

## 2021-09-28 DIAGNOSIS — F4310 Post-traumatic stress disorder, unspecified: Secondary | ICD-10-CM

## 2021-09-28 MED ORDER — TRAZODONE HCL 100 MG PO TABS: 50.0000 mg | ORAL_TABLET | Freq: Every day | ORAL | 0 refills | Status: AC

## 2021-09-28 MED ORDER — DESVENLAFAXINE SUCCINATE ER 100 MG PO TB24: 100.0000 mg | ORAL_TABLET | Freq: Every day | ORAL | 2 refills | Status: AC

## 2021-09-28 NOTE — Progress Notes (Signed)
Virtual Visit via Video Note  I connected with Jane Edwards on 09/28/21 at  2:40 PM EDT by a video enabled telemedicine application and verified that I am speaking with the correct person using two identifiers.  Location: Patient: Home Provider: Home Office   I discussed the limitations of evaluation and management by telemedicine and the availability of in person appointments. The patient expressed understanding and agreed to proceed.  History of Present Illness: Patient is evaluated by a video session.  She had a difficult past few months because she was in and out from the hospital due to complication of venous port.  Her ports are now removed but she is going to have a new port coming soon.  She had chronic hallucination but it is stable.  She is sleeping okay with the help of trazodone and CPAP.  She is taking Pristiq which is helping her anxiety and since taking the medicine on time she denies any crying spells or any feeling of hopelessness.  Her swelling is much better since started the Lasix but she has to go to pee a lot.  She is not in therapy because she is busy taking care of her other health conditions and doctors appointments.  Patient admitted sometime having attention and focus issues and forgetful.  Patient patient reported that she was told that she had a spot in the brain but do not know the details.  Patient did.  His struggle with memory sometimes but very comfortable living with the parents were supportive.  Her appetite is okay.  Her weight is stable.  Occasionally she has nightmares and flashback but she feels her depression is a stable with the help of medication.  Past Psychiatric History: Reviewed. H/O PTSD and anxiety disorder. Saw psychiatrist in [place] state and given Minipress. Paxil caused frequent urination and Prozac caused interaction with Topamax. Minipress stopped working. No h/o suicidal attempt or inpatient treatment.     Psychiatric Specialty  Exam: Physical Exam  Review of Systems  Weight (!) 320 lb (145.2 kg).There is no height or weight on file to calculate BMI.  General Appearance: Fairly Groomed and puffiness on face  Eye Contact:  Fair  Speech:  Slow  Volume:  Decreased  Mood:   tired  Affect:  Congruent  Thought Process:  Descriptions of Associations: Intact  Orientation:  Full (Time, Place, and Person)  Thought Content:  Rumination  Suicidal Thoughts:  No  Homicidal Thoughts:  No  Memory:  Immediate;   Fair Recent;   Fair Remote;   Fair  Judgement:  Fair  Insight:  Fair  Psychomotor Activity:  Decreased  Concentration:  Concentration: Fair and Attention Span: Fair  Recall:  [employer] of Knowledge:  Fair  Language:  Fair  Akathisia:  No  Handed:  Right  AIMS (if indicated):     Assets:  Communication Skills Desire for Improvement Housing Social Support  ADL's:  Intact  Cognition:  WNL  Sleep:   ok with CPAP      Assessment and Plan: PTSD.  Major depressive disorder, recurrent.  I reviewed current medication.  She is taking Tegretol and Topamax from neurologist.  She like to keep the current medication which is trazodone 50 mg -100 mg as needed for insomnia and Pristiq 100 mg daily.  Patient like to go back on therapy once she finished her surgical procedure.  Discussed medication side effects and benefits.  Encouraged to keep appointment with neurology for follow-up.  Recommended to  call us back if she is any question or any concern.  Follow-up in 3 months.  She also taking  Follow Up Instructions:    I discussed the assessment and treatment plan with the patient. The patient was provided an opportunity to ask questions and all were answered. The patient agreed with the plan and demonstrated an understanding of the instructions.   The patient was advised to call back or seek an in-person evaluation if the symptoms worsen or if the condition fails to improve as anticipated.  I provided 21 minutes  of non-face-to-face time during this encounter.   Kathlee Nations, MD

## 2021-09-29 ENCOUNTER — Ambulatory Visit (HOSPITAL_COMMUNITY)
Admission: RE | Admit: 2021-09-29 | Discharge: 2021-09-29 | Disposition: A | Discharge status: Home / Self Care | Payer: Medicare Other | Source: Ambulatory Visit | Attending: Interventional Radiology | Admitting: Interventional Radiology

## 2021-09-29 ENCOUNTER — Other Ambulatory Visit: Payer: Self-pay

## 2021-09-29 DIAGNOSIS — D250 Submucous leiomyoma of uterus: Secondary | ICD-10-CM | POA: Insufficient documentation

## 2021-09-29 MED ORDER — GADOBUTROL 1 MMOL/ML IV SOLN
10.0000 mL | Freq: Once | INTRAVENOUS | Status: AC | PRN
  Administered 2021-09-29: 10 mL via INTRAVENOUS

## 2021-10-14 ENCOUNTER — Other Ambulatory Visit: Payer: Self-pay | Admitting: Neurology

## 2021-10-20 ENCOUNTER — Other Ambulatory Visit: Payer: Self-pay | Admitting: Radiology

## 2021-10-20 ENCOUNTER — Other Ambulatory Visit (HOSPITAL_COMMUNITY): Payer: Self-pay | Admitting: Interventional Radiology

## 2021-10-20 DIAGNOSIS — D259 Leiomyoma of uterus, unspecified: Secondary | ICD-10-CM

## 2021-10-20 MED ORDER — FAMOTIDINE 10 MG PO TABS: 20.0000 mg | ORAL_TABLET | Freq: Once | ORAL | Status: DC

## 2021-10-20 MED ORDER — FAMOTIDINE 20 MG PO TABS: 20.0000 mg | ORAL_TABLET | Freq: Once | ORAL | 0 refills | Status: DC

## 2021-10-20 MED ORDER — DIPHENHYDRAMINE HCL 25 MG PO CAPS: 50.0000 mg | ORAL_CAPSULE | Freq: Once | ORAL | Status: DC

## 2021-10-20 MED ORDER — DIPHENHYDRAMINE HCL 50 MG PO CAPS: ORAL_CAPSULE | ORAL | 0 refills | Status: AC

## 2021-10-20 MED ORDER — PREDNISONE 1 MG PO TABS: 50.0000 mg | ORAL_TABLET | Freq: Four times a day (QID) | ORAL | Status: DC

## 2021-10-20 MED ORDER — PREDNISONE 50 MG PO TABS: ORAL_TABLET | ORAL | 0 refills | Status: DC

## 2021-10-28 DIAGNOSIS — G4733 Obstructive sleep apnea (adult) (pediatric): Secondary | ICD-10-CM | POA: Diagnosis not present

## 2021-11-02 ENCOUNTER — Encounter: Payer: Self-pay | Admitting: Neurology

## 2021-11-02 ENCOUNTER — Ambulatory Visit (INDEPENDENT_AMBULATORY_CARE_PROVIDER_SITE_OTHER): Payer: Medicare Other | Admitting: Neurology

## 2021-11-02 VITALS — BP 120/79 | HR 71 | Ht 61.0 in | Wt 325.0 lb

## 2021-11-02 DIAGNOSIS — R5382 Chronic fatigue, unspecified: Secondary | ICD-10-CM | POA: Diagnosis not present

## 2021-11-02 DIAGNOSIS — E662 Morbid (severe) obesity with alveolar hypoventilation: Secondary | ICD-10-CM

## 2021-11-02 DIAGNOSIS — Z6841 Body Mass Index (BMI) 40.0 and over, adult: Secondary | ICD-10-CM

## 2021-11-02 DIAGNOSIS — G40909 Epilepsy, unspecified, not intractable, without status epilepticus: Secondary | ICD-10-CM | POA: Diagnosis not present

## 2021-11-02 DIAGNOSIS — I871 Compression of vein: Secondary | ICD-10-CM
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
## 2021-11-02 NOTE — Patient Instructions (Signed)
Continue current medications  Unable to get labs today because no access  Return in 3 months

## 2021-11-03 ENCOUNTER — Other Ambulatory Visit: Payer: Self-pay | Admitting: Internal Medicine

## 2021-11-03 ENCOUNTER — Other Ambulatory Visit: Payer: Self-pay | Admitting: Radiology

## 2021-11-04 ENCOUNTER — Ambulatory Visit (HOSPITAL_COMMUNITY)
Admission: RE | Admit: 2021-11-04 | Discharge: 2021-11-04 | Disposition: A | Discharge status: Home / Self Care | Payer: Medicare Other | Source: Ambulatory Visit | Attending: Interventional Radiology | Admitting: Interventional Radiology

## 2021-11-04 ENCOUNTER — Encounter (HOSPITAL_COMMUNITY): Payer: Self-pay

## 2021-11-04 ENCOUNTER — Other Ambulatory Visit (HOSPITAL_COMMUNITY): Payer: Self-pay | Admitting: Interventional Radiology

## 2021-11-04 ENCOUNTER — Other Ambulatory Visit: Payer: Self-pay

## 2021-11-04 DIAGNOSIS — E119 Type 2 diabetes mellitus without complications: Secondary | ICD-10-CM | POA: Diagnosis not present

## 2021-11-04 DIAGNOSIS — D259 Leiomyoma of uterus, unspecified: Secondary | ICD-10-CM | POA: Insufficient documentation

## 2021-11-04 DIAGNOSIS — N939 Abnormal uterine and vaginal bleeding, unspecified: Secondary | ICD-10-CM | POA: Diagnosis not present

## 2021-11-04 HISTORY — PX: IR ANGIOGRAM SELECTIVE EACH ADDITIONAL VESSEL: IMG667

## 2021-11-04 HISTORY — PX: IR EMBO TUMOR ORGAN ISCHEMIA INFARCT INC GUIDE ROADMAPPING: IMG5449

## 2021-11-04 HISTORY — PX: IR US GUIDE VASC ACCESS LEFT: IMG2389

## 2021-11-04 HISTORY — PX: IR ANGIOGRAM PELVIS SELECTIVE OR SUPRASELECTIVE: IMG661

## 2021-11-04 LAB — CBC WITH DIFFERENTIAL/PLATELET
Abs Immature Granulocytes: 0.06 10*3/uL (ref 0.00–0.07)
Basophils Absolute: 0 10*3/uL (ref 0.0–0.1)
Basophils Relative: 0 %
Eosinophils Absolute: 0 10*3/uL (ref 0.0–0.5)
Eosinophils Relative: 0 %
HCT: 46 % (ref 36.0–46.0)
Hemoglobin: 14.1 g/dL (ref 12.0–15.0)
Immature Granulocytes: 1 %
Lymphocytes Relative: 10 %
Lymphs Abs: 0.6 10*3/uL — ABNORMAL LOW (ref 0.7–4.0)
MCH: 28 pg (ref 26.0–34.0)
MCHC: 30.7 g/dL (ref 30.0–36.0)
MCV: 91.3 fL (ref 80.0–100.0)
Monocytes Absolute: 0.1 10*3/uL (ref 0.1–1.0)
Monocytes Relative: 1 %
Neutro Abs: 4.9 10*3/uL (ref 1.7–7.7)
Neutrophils Relative %: 88 %
Platelets: 211 10*3/uL (ref 150–400)
RBC: 5.04 MIL/uL (ref 3.87–5.11)
RDW: 13.2 % (ref 11.5–15.5)
WBC: 5.6 10*3/uL (ref 4.0–10.5)
nRBC: 0 % (ref 0.0–0.2)

## 2021-11-04 LAB — GLUCOSE, CAPILLARY: Glucose-Capillary: 125 mg/dL — ABNORMAL HIGH (ref 70–99)

## 2021-11-04 LAB — BASIC METABOLIC PANEL
Anion gap: 11 (ref 5–15)
BUN: 9 mg/dL (ref 6–20)
CO2: 17 mmol/L — ABNORMAL LOW (ref 22–32)
Calcium: 9.4 mg/dL (ref 8.9–10.3)
Chloride: 107 mmol/L (ref 98–111)
Creatinine, Ser: 0.81 mg/dL (ref 0.44–1.00)
GFR, Estimated: >60 mL/min (ref >60)
Glucose, Bld: 135 mg/dL — ABNORMAL HIGH (ref 70–99)
Potassium: 4.5 mmol/L (ref 3.5–5.1)
Sodium: 135 mmol/L (ref 135–145)

## 2021-11-04 LAB — NO BLOOD PRODUCTS

## 2021-11-04 LAB — PROTIME-INR
INR: 1 (ref 0.8–1.2)
Prothrombin Time: 13.5 seconds (ref 11.4–15.2)

## 2021-11-04 LAB — HCG, SERUM, QUALITATIVE: Preg, Serum: NEGATIVE

## 2021-11-04 MED ORDER — ACETAMINOPHEN 10 MG/ML IV SOLN: 1000.0000 mg | Freq: Once | INTRAVENOUS | Status: AC

## 2021-11-04 MED ORDER — IOHEXOL 300 MG/ML  SOLN
100.0000 mL | Freq: Once | INTRAMUSCULAR | Status: AC | PRN
  Administered 2021-11-04: 30 mL via INTRA_ARTERIAL

## 2021-11-04 MED ORDER — CLINDAMYCIN PHOSPHATE 900 MG/50ML IV SOLN: 900.0000 mg | INTRAVENOUS | Status: DC

## 2021-11-04 MED ORDER — FENTANYL CITRATE (PF) 100 MCG/2ML IJ SOLN
INTRAMUSCULAR | Status: AC
  Filled 2021-11-04: qty 4

## 2021-11-04 MED ORDER — NITROGLYCERIN IN D5W 100-5 MCG/ML-% IV SOLN
INTRAVENOUS | Status: AC
  Filled 2021-11-04: qty 250

## 2021-11-04 MED ORDER — BUMETANIDE 1 MG PO TABS
1.0000 mg | ORAL_TABLET | Freq: Every day | ORAL | Status: AC
  Administered 2021-11-04: 1 mg via ORAL
  Filled 2021-11-04: qty 1

## 2021-11-04 MED ORDER — ACETAMINOPHEN 10 MG/ML IV SOLN
INTRAVENOUS | Status: AC
  Administered 2021-11-04: 1000 mg via INTRAVENOUS
  Filled 2021-11-04: qty 100

## 2021-11-04 MED ORDER — ONDANSETRON HCL 4 MG/2ML IJ SOLN: 4.0000 mg | Freq: Four times a day (QID) | INTRAMUSCULAR | Status: DC | PRN

## 2021-11-04 MED ORDER — IOHEXOL 300 MG/ML  SOLN
100.0000 mL | Freq: Once | INTRAMUSCULAR | Status: AC | PRN
  Administered 2021-11-04: 50 mL via INTRA_ARTERIAL

## 2021-11-04 MED ORDER — CARBAMAZEPINE 200 MG PO TABS
300.0000 mg | ORAL_TABLET | Freq: Once | ORAL | Status: AC
  Administered 2021-11-04: 300 mg via ORAL
  Filled 2021-11-04: qty 1.5

## 2021-11-04 MED ORDER — LIDOCAINE HCL (PF) 1 % IJ SOLN
INTRAMUSCULAR | Status: AC
  Filled 2021-11-04: qty 30

## 2021-11-04 MED ORDER — OXYCODONE HCL 5 MG PO TABS: 5.0000 mg | ORAL_TABLET | ORAL | 0 refills | Status: DC | PRN

## 2021-11-04 MED ORDER — FENTANYL CITRATE (PF) 100 MCG/2ML IJ SOLN
INTRAMUSCULAR | Status: AC | PRN
  Administered 2021-11-04: 50 ug via INTRAVENOUS

## 2021-11-04 MED ORDER — DOCUSATE SODIUM 100 MG PO CAPS
100.0000 mg | ORAL_CAPSULE | Freq: Once | ORAL | Status: DC
  Filled 2021-11-04: qty 1

## 2021-11-04 MED ORDER — VANCOMYCIN HCL 1500 MG/300ML IV SOLN
1500.0000 mg | INTRAVENOUS | Status: AC
  Administered 2021-11-04: 1500 mg via INTRAVENOUS
  Filled 2021-11-04: qty 300

## 2021-11-04 MED ORDER — ACETAMINOPHEN 500 MG PO TABS: 1000.0000 mg | ORAL_TABLET | ORAL | Status: DC

## 2021-11-04 MED ORDER — ACETAMINOPHEN 500 MG PO TABS: 1000.0000 mg | ORAL_TABLET | Freq: Three times a day (TID) | ORAL | 0 refills | Status: AC | PRN

## 2021-11-04 MED ORDER — OXYCODONE HCL 5 MG PO TABS
5.0000 mg | ORAL_TABLET | ORAL | Status: DC | PRN
  Administered 2021-11-04: 5 mg via ORAL
  Filled 2021-11-04: qty 1

## 2021-11-04 MED ORDER — HEPARIN SODIUM (PORCINE) 1000 UNIT/ML IJ SOLN
INTRAMUSCULAR | Status: AC
  Filled 2021-11-04: qty 1

## 2021-11-04 MED ORDER — MIDAZOLAM HCL 2 MG/2ML IJ SOLN
INTRAMUSCULAR | Status: AC | PRN
  Administered 2021-11-04: .5 mg via INTRAVENOUS

## 2021-11-04 MED ORDER — AMLODIPINE BESYLATE 10 MG PO TABS
10.0000 mg | ORAL_TABLET | Freq: Every day | ORAL | Status: AC
  Administered 2021-11-04: 10 mg via ORAL
  Filled 2021-11-04: qty 1

## 2021-11-04 MED ORDER — MIDAZOLAM HCL 2 MG/2ML IJ SOLN
INTRAMUSCULAR | Status: AC
  Filled 2021-11-04: qty 4

## 2021-11-04 MED ORDER — OXYCODONE HCL 5 MG PO TABS: 10.0000 mg | ORAL_TABLET | ORAL | Status: DC | PRN

## 2021-11-04 MED ORDER — PROMETHAZINE HCL 12.5 MG PO TABS: 12.5000 mg | ORAL_TABLET | ORAL | 0 refills | Status: AC | PRN

## 2021-11-04 MED ORDER — VERAPAMIL HCL 2.5 MG/ML IV SOLN: INTRA_ARTERIAL | Status: AC | PRN

## 2021-11-04 MED ORDER — SODIUM CHLORIDE 0.9 % IV SOLN: INTRAVENOUS | Status: DC

## 2021-11-04 MED ORDER — SODIUM CHLORIDE 0.9 % IV SOLN
8.0000 mg | INTRAVENOUS | Status: AC
  Administered 2021-11-04: 8 mg via INTRAVENOUS
  Filled 2021-11-04: qty 8

## 2021-11-04 MED ORDER — TOPIRAMATE ER 25 MG PO SPRINKLE CAP24
150.0000 mg | EXTENDED_RELEASE_CAPSULE | Freq: Once | ORAL | Status: AC
  Administered 2021-11-04: 150 mg via ORAL
  Filled 2021-11-04: qty 2

## 2021-11-04 MED ORDER — CARVEDILOL 25 MG PO TABS
25.0000 mg | ORAL_TABLET | Freq: Two times a day (BID) | ORAL | Status: AC
  Administered 2021-11-04: 25 mg via ORAL
  Filled 2021-11-04: qty 1

## 2021-11-04 MED ORDER — DOCUSATE SODIUM 100 MG PO CAPS: 100.0000 mg | ORAL_CAPSULE | Freq: Two times a day (BID) | ORAL | 0 refills | Status: DC

## 2021-11-04 MED ORDER — VERAPAMIL HCL 2.5 MG/ML IV SOLN
INTRAVENOUS | Status: AC
  Filled 2021-11-04: qty 2

## 2021-11-04 MED ORDER — LIDOCAINE HCL 1 % IJ SOLN
INTRAMUSCULAR | Status: AC
  Filled 2021-11-04: qty 20

## 2021-11-04 NOTE — Discharge Instructions (Addendum)
Please call Interventional Radiology clinic [phone] with any questions or concerns.  You may remove your dressing and shower tomorrow.    Uterine Artery Embolization for Fibroids, Care After This sheet gives you information about how to care for yourself after your procedure. Your health care provider may also give you more specific instructions. If you have problems or questions, contact your health careprovider. What can I expect after the procedure? After your procedure, it is common to have: Pelvic cramping. You will be given pain medicine. Nausea and vomiting. You may be given medicine to help relieve nausea. Follow these instructions at home: Incision care Follow instructions from your health care provider about how to take care of your incision. Make sure you: Wash your hands with soap and water before you change your bandage (dressing). If soap and water are not available, use hand sanitizer. Change your dressing as told by your health care provider. Check your incision area every day for signs of infection. Check for: More redness, swelling, or pain. More fluid or blood. Warmth. Pus or a bad smell. Medicines  Take over-the-counter and prescription medicines only as told by your health care provider. Do not take aspirin. It can cause bleeding. Do not drive for 24 hours if you were given a medicine to help you relax (sedative). Do not drive or use heavy machinery while taking prescription pain medicine.   General instructions Ask your health care provider when you can resume sexual activity. To prevent or treat constipation while you are taking prescription pain medicine, your health care provider may recommend that you: Drink enough fluid to keep your urine clear or pale yellow. Take over-the-counter or prescription medicines. Eat foods that are high in fiber, such as fresh fruits and vegetables, whole grains, and beans. Limit foods that are high in fat and processed  sugars, such as fried and sweet foods. Contact a health care provider if: You have a fever. You have more redness, swelling, or pain around your incision site. You have more fluid or blood coming from your incision site. Your incision feels warm to the touch. You have pus or a bad smell coming from your incision. You have a rash. You have uncontrolled nausea or you cannot eat or drink anything without vomiting. Get help right away if: You have trouble breathing. You have chest pain. You have severe abdominal pain. You have leg pain. You become dizzy and faint. Summary After your procedure, it is common to have pelvic cramping. You will be given pain medicine. Follow instructions from your health care provider about how to take care of your incision. Check your incision area every day for signs of infection. Take over-the-counter and prescription medicines only as told by your health care provider. This information is not intended to replace advice given to you by your health care provider. Make sure you discuss any questions you have with your healthcare provider. Document Revised: 11/18/2017 Document Reviewed: 03/10/2017 Elsevier Patient Education  2021 [employer].    Moderate Conscious Sedation, Adult, Care After This sheet gives you information about how to care for yourself after your procedure. Your health care provider may also give you more specific instructions. If you have problems or questions, contact your health care provider. What can I expect after the procedure? After the procedure, it is common to have: Sleepiness for several hours. Impaired judgment for several hours. Difficulty with balance. Vomiting if you eat too soon. Follow these instructions at home: For the time period you  were told by your health care provider: Rest. Do not participate in activities where you could fall or become injured. Do not drive or use machinery. Do not drink alcohol. Do not  take sleeping pills or medicines that cause drowsiness. Do not make important decisions or sign legal documents. Do not take care of children on your own.        Eating and drinking Follow the diet recommended by your health care provider. Drink enough fluid to keep your urine pale yellow. If you vomit: Drink water, juice, or soup when you can drink without vomiting. Make sure you have little or no nausea before eating solid foods.    General instructions Take over-the-counter and prescription medicines only as told by your health care provider. Have a responsible adult stay with you for the time you are told. It is important to have someone help care for you until you are awake and alert. Do not smoke. Keep all follow-up visits as told by your health care provider. This is important. Contact a health care provider if: You are still sleepy or having trouble with balance after 24 hours. You feel light-headed. You keep feeling nauseous or you keep vomiting. You develop a rash. You have a fever. You have redness or swelling around the IV site. Get help right away if: You have trouble breathing. You have new-onset confusion at home. Summary After the procedure, it is common to feel sleepy, have impaired judgment, or feel nauseous if you eat too soon. Rest after you get home. Know the things you should not do after the procedure. Follow the diet recommended by your health care provider and drink enough fluid to keep your urine pale yellow. Get help right away if you have trouble breathing or new-onset confusion at home. This information is not intended to replace advice given to you by your health care provider. Make sure you discuss any questions you have with your health care provider. Document Revised: 04/04/2020 Document Reviewed: 11/01/2019 Elsevier Patient Education  2021 [employer].
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
## 2021-11-04 NOTE — Procedures (Signed)
Interventional Radiology Procedure Note  Procedure: Bilateral uterine artery embolization  Findings: Please refer to procedural dictation for full description.  Bilateral uterine artery embolization with approximately 1 vial of 500-700 micron Embospheres per side.  Left radial access with TR band inflated to 13 cc.  Complications: None immediate  Estimated Blood Loss: < 5 mL  Recommendations: TR band protocol. Oxycodone for pain relief prior to discharge PRN. Discharge home 1 hour after TR band removal.  Tylenol 1,000 mg Q8H for 4 days. Phenergan 12.5 mg Q4H prn for nausea. Oxycodone 5 mg Q4H prn for severe pain. Colace 100 mg BID if taking oxycodone.   Ruthann Cancer, MD Pager: [phone]

## 2021-11-06 ENCOUNTER — Other Ambulatory Visit: Payer: Self-pay | Admitting: Physician Assistant

## 2021-11-06 ENCOUNTER — Other Ambulatory Visit (HOSPITAL_COMMUNITY): Payer: Medicare Other

## 2021-11-06 ENCOUNTER — Other Ambulatory Visit (HOSPITAL_COMMUNITY): Payer: Self-pay | Admitting: Interventional Radiology

## 2021-11-06 DIAGNOSIS — I871 Compression of vein: Secondary | ICD-10-CM

## 2021-11-06 MED ORDER — PREDNISONE 50 MG PO TABS: ORAL_TABLET | ORAL | 0 refills | Status: DC

## 2021-11-06 MED ORDER — DIPHENHYDRAMINE HCL 50 MG PO CAPS: ORAL_CAPSULE | ORAL | 0 refills | Status: AC

## 2021-11-13 ENCOUNTER — Other Ambulatory Visit: Payer: Self-pay | Admitting: Neurology

## 2021-11-18 DIAGNOSIS — I871 Compression of vein: Secondary | ICD-10-CM | POA: Diagnosis not present

## 2021-11-18 DIAGNOSIS — I10 Essential (primary) hypertension: Secondary | ICD-10-CM | POA: Diagnosis not present

## 2021-11-18 DIAGNOSIS — E7800 Pure hypercholesterolemia, unspecified: Secondary | ICD-10-CM | POA: Diagnosis not present

## 2021-11-18 DIAGNOSIS — E1159 Type 2 diabetes mellitus with other circulatory complications: Secondary | ICD-10-CM | POA: Diagnosis not present

## 2021-11-19 ENCOUNTER — Encounter (HOSPITAL_COMMUNITY): Payer: Self-pay | Admitting: Interventional Radiology

## 2021-11-19 ENCOUNTER — Telehealth: Payer: Self-pay | Admitting: Radiology

## 2021-11-19 ENCOUNTER — Other Ambulatory Visit: Payer: Self-pay | Admitting: Radiology

## 2021-11-19 NOTE — Progress Notes (Signed)
   Pt is scheduled for IR procedure 11/23/21  Allergic to contrast I spoke to pts Mother today via phone She says they have premedication ready at home and is aware of times to take meds for pre procedure  All questions answered

## 2021-11-19 NOTE — Progress Notes (Addendum)
[facility] denies chest pain or shortness of breath.  Patient denies having any s/s of Covid in her household.  Patient denies any known exposure to Covid. Jane Edwards will be tested for Covid on arrival to the hospital.  Jane Edwards has seizures, patient said it has few a month or so since she had last seizure.  Jane Edwards has type diabetes, she does not take medications, patient reports that she checks CBG's 3 times a day. CBG have ran 81-125, last A1C was 5.4 drawn 11/18/21 at PCP's office Jane Edwards. I instructed patient to check CBG after awaking and every 2 hours until arrival  to the hospital.  I Instructed patient if CBG is less than 70 to take 4 Glucose Tablets or 1 tube of Glucose Gel or 1/2 cup of a clear juice. Recheck CBG in 15 minutes if CBG is not over 70 call, pre- op desk at [phone] for further instructions.   Cardiologist is Dr. Jacklynn Ganong, Jane Edwards saw cardiologist to evaluate a heart murmer.   I instructed [name] to shower with antibiotic soap, if it is available.  Dry off with a clean towel. Do not put lotion, powder, cologne or deodorant or makeup.No jewelry or piercings. Men may shave their face and neck. Woman should not shave. No nail polish, artificial or acrylic nails. Wear clean clothes, brush your teeth. Glasses, contact lens,dentures or partials may not be worn in the OR. If you need to wear them, please bring a case for glasses, do not wear contacts or bring a case, the hospital does not have contact cases, dentures or partials will have to be removed , make sure they are clean, we will provide a denture cup to put them in. You will need some one to drive you home and a responsible person over the age of 58 to stay with you for the first 24 hours after surgery.

## 2021-11-20 ENCOUNTER — Other Ambulatory Visit (HOSPITAL_COMMUNITY): Payer: Self-pay | Admitting: Physician Assistant

## 2021-11-23 ENCOUNTER — Observation Stay (HOSPITAL_COMMUNITY)
Admission: RE | Admit: 2021-11-23 | Discharge: 2021-11-25 | Disposition: A | Discharge status: Home / Self Care | Payer: Medicare Other | Attending: Interventional Radiology | Admitting: Interventional Radiology

## 2021-11-23 ENCOUNTER — Encounter (HOSPITAL_COMMUNITY): Payer: Self-pay | Admitting: Interventional Radiology

## 2021-11-23 ENCOUNTER — Other Ambulatory Visit: Payer: Self-pay

## 2021-11-23 ENCOUNTER — Encounter (HOSPITAL_COMMUNITY)
Admission: RE | Disposition: A | Discharge status: Home / Self Care | Payer: Self-pay | Source: Home / Self Care | Attending: Interventional Radiology

## 2021-11-23 ENCOUNTER — Ambulatory Visit (HOSPITAL_COMMUNITY): Payer: Medicare Other | Admitting: Certified Registered"

## 2021-11-23 ENCOUNTER — Observation Stay (HOSPITAL_COMMUNITY)
Admission: RE | Admit: 2021-11-23 | Payer: Medicare Other | Source: Ambulatory Visit | Admitting: Interventional Radiology

## 2021-11-23 ENCOUNTER — Encounter (HOSPITAL_COMMUNITY): Payer: Self-pay

## 2021-11-23 DIAGNOSIS — I871 Compression of vein: Principal | ICD-10-CM | POA: Insufficient documentation

## 2021-11-23 DIAGNOSIS — Z79899 Other long term (current) drug therapy: Secondary | ICD-10-CM | POA: Insufficient documentation

## 2021-11-23 DIAGNOSIS — E119 Type 2 diabetes mellitus without complications: Secondary | ICD-10-CM | POA: Insufficient documentation

## 2021-11-23 DIAGNOSIS — Z91040 Latex allergy status: Secondary | ICD-10-CM | POA: Diagnosis not present

## 2021-11-23 DIAGNOSIS — I509 Heart failure, unspecified: Secondary | ICD-10-CM | POA: Diagnosis not present

## 2021-11-23 DIAGNOSIS — E559 Vitamin D deficiency, unspecified: Secondary | ICD-10-CM | POA: Diagnosis not present

## 2021-11-23 DIAGNOSIS — Z91041 Radiographic dye allergy status: Secondary | ICD-10-CM | POA: Diagnosis not present

## 2021-11-23 DIAGNOSIS — Z20822 Contact with and (suspected) exposure to covid-19: Secondary | ICD-10-CM | POA: Insufficient documentation

## 2021-11-23 DIAGNOSIS — I82B11 Acute embolism and thrombosis of right subclavian vein: Secondary | ICD-10-CM | POA: Diagnosis not present

## 2021-11-23 DIAGNOSIS — D500 Iron deficiency anemia secondary to blood loss (chronic): Secondary | ICD-10-CM | POA: Diagnosis not present

## 2021-11-23 DIAGNOSIS — E7800 Pure hypercholesterolemia, unspecified: Secondary | ICD-10-CM | POA: Diagnosis not present

## 2021-11-23 DIAGNOSIS — I110 Hypertensive heart disease with heart failure: Secondary | ICD-10-CM | POA: Insufficient documentation

## 2021-11-23 HISTORY — DX: Heart failure, unspecified: I50.9

## 2021-11-23 HISTORY — DX: Unspecified osteoarthritis, unspecified site: M19.90

## 2021-11-23 HISTORY — DX: Other abnormalities of breathing: R06.89

## 2021-11-23 HISTORY — PX: IR US GUIDE VASC ACCESS RIGHT: IMG2390

## 2021-11-23 HISTORY — DX: Cardiac murmur, unspecified: R01.1

## 2021-11-23 HISTORY — PX: RADIOLOGY WITH ANESTHESIA: SHX6223

## 2021-11-23 HISTORY — PX: IR INTRAVASCULAR ULTRASOUND NON CORONARY: IMG6085

## 2021-11-23 HISTORY — PX: IR TRANSCATH PLC STENT 1ST ART NOT LE CV CAR VERT CAR: IMG5443

## 2021-11-23 HISTORY — DX: Compression of vein: I87.1

## 2021-11-23 LAB — BASIC METABOLIC PANEL
Anion gap: 8 (ref 5–15)
BUN: 10 mg/dL (ref 6–20)
CO2: 20 mmol/L — ABNORMAL LOW (ref 22–32)
Calcium: 9.3 mg/dL (ref 8.9–10.3)
Chloride: 107 mmol/L (ref 98–111)
Creatinine, Ser: 0.92 mg/dL (ref 0.44–1.00)
GFR, Estimated: >60 mL/min (ref >60)
Glucose, Bld: 156 mg/dL — ABNORMAL HIGH (ref 70–99)
Potassium: 4.3 mmol/L (ref 3.5–5.1)
Sodium: 135 mmol/L (ref 135–145)

## 2021-11-23 LAB — CBC
HCT: 46.8 % — ABNORMAL HIGH (ref 36.0–46.0)
Hemoglobin: 15 g/dL (ref 12.0–15.0)
MCH: 28.5 pg (ref 26.0–34.0)
MCHC: 32.1 g/dL (ref 30.0–36.0)
MCV: 88.8 fL (ref 80.0–100.0)
Platelets: 212 10*3/uL (ref 150–400)
RBC: 5.27 MIL/uL — ABNORMAL HIGH (ref 3.87–5.11)
RDW: 12.8 % (ref 11.5–15.5)
WBC: 5 10*3/uL (ref 4.0–10.5)
nRBC: 0 % (ref 0.0–0.2)

## 2021-11-23 LAB — GLUCOSE, CAPILLARY
Glucose-Capillary: 156 mg/dL — ABNORMAL HIGH (ref 70–99)
Glucose-Capillary: 138 mg/dL — ABNORMAL HIGH (ref 70–99)
Glucose-Capillary: 127 mg/dL — ABNORMAL HIGH (ref 70–99)

## 2021-11-23 LAB — SARS CORONAVIRUS 2 BY RT PCR (HOSPITAL ORDER, PERFORMED IN ~~LOC~~ HOSPITAL LAB): SARS Coronavirus 2: NEGATIVE

## 2021-11-23 LAB — PROTIME-INR
INR: 1 (ref 0.8–1.2)
Prothrombin Time: 13.3 seconds (ref 11.4–15.2)

## 2021-11-23 SURGERY — IR WITH ANESTHESIA
Anesthesia: General

## 2021-11-23 MED ORDER — SPIRONOLACTONE 25 MG PO TABS
50.0000 mg | ORAL_TABLET | Freq: Every day | ORAL | Status: DC
  Administered 2021-11-23 – 2021-11-25 (×3): 50 mg via ORAL
  Filled 2021-11-23 (×3): qty 2

## 2021-11-23 MED ORDER — IOHEXOL 300 MG/ML  SOLN
100.0000 mL | Freq: Once | INTRAMUSCULAR | Status: AC | PRN
  Administered 2021-11-23: 70 mL via INTRAVENOUS

## 2021-11-23 MED ORDER — ENOXAPARIN SODIUM 150 MG/ML IJ SOSY
140.0000 mg | PREFILLED_SYRINGE | Freq: Once | INTRAMUSCULAR | Status: AC
  Administered 2021-11-23: 140 mg via SUBCUTANEOUS
  Filled 2021-11-23: qty 0.94

## 2021-11-23 MED ORDER — OXYCODONE HCL 5 MG PO TABS
5.0000 mg | ORAL_TABLET | ORAL | Status: DC | PRN
  Administered 2021-11-23: 5 mg via ORAL
  Filled 2021-11-23: qty 1

## 2021-11-23 MED ORDER — BUMETANIDE 1 MG PO TABS
1.0000 mg | ORAL_TABLET | Freq: Every day | ORAL | Status: DC
  Administered 2021-11-24 – 2021-11-25 (×2): 1 mg via ORAL
  Filled 2021-11-23 (×2): qty 1

## 2021-11-23 MED ORDER — ENOXAPARIN SODIUM 150 MG/ML IJ SOSY
140.0000 mg | PREFILLED_SYRINGE | Freq: Two times a day (BID) | INTRAMUSCULAR | Status: DC
  Administered 2021-11-24 – 2021-11-25 (×3): 140 mg via SUBCUTANEOUS
  Filled 2021-11-23 (×4): qty 0.94

## 2021-11-23 MED ORDER — CLOPIDOGREL BISULFATE 75 MG PO TABS
75.0000 mg | ORAL_TABLET | Freq: Every day | ORAL | Status: DC
  Administered 2021-11-24 – 2021-11-25 (×2): 75 mg via ORAL
  Filled 2021-11-23 (×2): qty 1

## 2021-11-23 MED ORDER — LACTATED RINGERS IV SOLN: INTRAVENOUS | Status: DC

## 2021-11-23 MED ORDER — ORAL CARE MOUTH RINSE: 15.0000 mL | Freq: Once | OROMUCOSAL | Status: AC

## 2021-11-23 MED ORDER — FENTANYL CITRATE (PF) 250 MCG/5ML IJ SOLN
INTRAMUSCULAR | Status: AC
  Filled 2021-11-23: qty 5

## 2021-11-23 MED ORDER — SUCCINYLCHOLINE CHLORIDE 200 MG/10ML IV SOSY
PREFILLED_SYRINGE | INTRAVENOUS | Status: DC | PRN
  Administered 2021-11-23: 200 mg via INTRAVENOUS

## 2021-11-23 MED ORDER — IOHEXOL 300 MG/ML  SOLN
100.0000 mL | Freq: Once | INTRAMUSCULAR | Status: AC | PRN
  Administered 2021-11-23: 60 mL via INTRAVENOUS

## 2021-11-23 MED ORDER — MIDAZOLAM HCL 2 MG/2ML IJ SOLN
2.0000 mg | Freq: Once | INTRAMUSCULAR | Status: AC
  Administered 2021-11-23: 2 mg via INTRAVENOUS

## 2021-11-23 MED ORDER — TRAZODONE HCL 50 MG PO TABS
50.0000 mg | ORAL_TABLET | Freq: Every day | ORAL | Status: DC
  Administered 2021-11-23 – 2021-11-24 (×2): 50 mg via ORAL
  Filled 2021-11-23 (×2): qty 1

## 2021-11-23 MED ORDER — AMLODIPINE BESYLATE 10 MG PO TABS
10.0000 mg | ORAL_TABLET | Freq: Every day | ORAL | Status: DC
  Administered 2021-11-24 – 2021-11-25 (×2): 10 mg via ORAL
  Filled 2021-11-23 (×2): qty 1

## 2021-11-23 MED ORDER — MIDAZOLAM HCL 2 MG/2ML IJ SOLN
INTRAMUSCULAR | Status: AC
  Filled 2021-11-23: qty 2

## 2021-11-23 MED ORDER — ONDANSETRON HCL 4 MG/2ML IJ SOLN
INTRAMUSCULAR | Status: DC | PRN
  Administered 2021-11-23: 4 mg via INTRAVENOUS

## 2021-11-23 MED ORDER — DEXMEDETOMIDINE (PRECEDEX) IN NS 20 MCG/5ML (4 MCG/ML) IV SYRINGE
PREFILLED_SYRINGE | INTRAVENOUS | Status: DC | PRN
  Administered 2021-11-23: 8 ug via INTRAVENOUS
  Administered 2021-11-23: 12 ug via INTRAVENOUS

## 2021-11-23 MED ORDER — AMISULPRIDE (ANTIEMETIC) 5 MG/2ML IV SOLN
INTRAVENOUS | Status: AC
  Filled 2021-11-23: qty 4

## 2021-11-23 MED ORDER — PROPOFOL 10 MG/ML IV BOLUS
INTRAVENOUS | Status: AC
  Filled 2021-11-23: qty 20

## 2021-11-23 MED ORDER — ROCURONIUM BROMIDE 10 MG/ML (PF) SYRINGE
PREFILLED_SYRINGE | INTRAVENOUS | Status: DC | PRN
  Administered 2021-11-23: 30 mg via INTRAVENOUS
  Administered 2021-11-23: 20 mg via INTRAVENOUS
  Administered 2021-11-23 (×2): 50 mg via INTRAVENOUS
  Administered 2021-11-23: 30 mg via INTRAVENOUS

## 2021-11-23 MED ORDER — CHLORHEXIDINE GLUCONATE 0.12 % MT SOLN
OROMUCOSAL | Status: AC
  Administered 2021-11-23: 15 mL via OROMUCOSAL
  Filled 2021-11-23: qty 15

## 2021-11-23 MED ORDER — CHLORHEXIDINE GLUCONATE 0.12 % MT SOLN: 15.0000 mL | Freq: Once | OROMUCOSAL | Status: AC

## 2021-11-23 MED ORDER — FENTANYL CITRATE (PF) 100 MCG/2ML IJ SOLN: 25.0000 ug | INTRAMUSCULAR | Status: DC | PRN

## 2021-11-23 MED ORDER — FERROUS SULFATE 325 (65 FE) MG PO TABS: 325.0000 mg | ORAL_TABLET | Freq: Every day | ORAL | Status: DC

## 2021-11-23 MED ORDER — PROPOFOL 10 MG/ML IV BOLUS
INTRAVENOUS | Status: DC | PRN
  Administered 2021-11-23: 200 mg via INTRAVENOUS

## 2021-11-23 MED ORDER — OXYCODONE HCL 5 MG PO TABS
10.0000 mg | ORAL_TABLET | ORAL | Status: DC | PRN
  Administered 2021-11-24 (×2): 10 mg via ORAL
  Filled 2021-11-23 (×2): qty 2

## 2021-11-23 MED ORDER — DESVENLAFAXINE SUCCINATE ER 100 MG PO TB24
100.0000 mg | ORAL_TABLET | Freq: Every day | ORAL | Status: DC
  Administered 2021-11-23 – 2021-11-25 (×3): 100 mg via ORAL
  Filled 2021-11-23 (×3): qty 1

## 2021-11-23 MED ORDER — IOHEXOL 300 MG/ML  SOLN
100.0000 mL | Freq: Once | INTRAMUSCULAR | Status: AC | PRN
  Administered 2021-11-23: 20 mL via INTRAVENOUS

## 2021-11-23 MED ORDER — FENTANYL CITRATE (PF) 250 MCG/5ML IJ SOLN
INTRAMUSCULAR | Status: DC | PRN
  Administered 2021-11-23 (×4): 50 ug via INTRAVENOUS

## 2021-11-23 MED ORDER — HYDRALAZINE HCL 20 MG/ML IJ SOLN: 10.0000 mg | INTRAMUSCULAR | Status: DC | PRN

## 2021-11-23 MED ORDER — SUGAMMADEX SODIUM 200 MG/2ML IV SOLN
INTRAVENOUS | Status: DC | PRN
  Administered 2021-11-23: 400 mg via INTRAVENOUS

## 2021-11-23 MED ORDER — CARVEDILOL 25 MG PO TABS
25.0000 mg | ORAL_TABLET | Freq: Two times a day (BID) | ORAL | Status: DC
  Administered 2021-11-23 – 2021-11-25 (×4): 25 mg via ORAL
  Filled 2021-11-23 (×4): qty 1

## 2021-11-23 MED ORDER — TOPIRAMATE ER 100 MG PO SPRINKLE CAP24
300.0000 mg | EXTENDED_RELEASE_CAPSULE | Freq: Every day | ORAL | Status: DC
  Administered 2021-11-24 – 2021-11-25 (×2): 300 mg via ORAL
  Filled 2021-11-23 (×2): qty 3

## 2021-11-23 MED ORDER — ONDANSETRON HCL 4 MG/2ML IJ SOLN: 4.0000 mg | Freq: Four times a day (QID) | INTRAMUSCULAR | Status: DC | PRN

## 2021-11-23 MED ORDER — CLOPIDOGREL BISULFATE 75 MG PO TABS
300.0000 mg | ORAL_TABLET | Freq: Once | ORAL | Status: AC
  Administered 2021-11-23: 300 mg via ORAL
  Filled 2021-11-23: qty 4

## 2021-11-23 MED ORDER — LIDOCAINE 2% (20 MG/ML) 5 ML SYRINGE
INTRAMUSCULAR | Status: DC | PRN
  Administered 2021-11-23: 100 mg via INTRAVENOUS

## 2021-11-23 MED ORDER — AMISULPRIDE (ANTIEMETIC) 5 MG/2ML IV SOLN
10.0000 mg | Freq: Once | INTRAVENOUS | Status: AC | PRN
  Administered 2021-11-23: 10 mg via INTRAVENOUS

## 2021-11-23 MED ORDER — PROMETHAZINE HCL 25 MG/ML IJ SOLN: 6.2500 mg | INTRAMUSCULAR | Status: DC | PRN

## 2021-11-23 MED ORDER — PHENYLEPHRINE HCL-NACL 20-0.9 MG/250ML-% IV SOLN
INTRAVENOUS | Status: DC | PRN
  Administered 2021-11-23: 20 ug/min via INTRAVENOUS

## 2021-11-23 MED ORDER — MAGNESIUM OXIDE -MG SUPPLEMENT 400 (240 MG) MG PO TABS
400.0000 mg | ORAL_TABLET | Freq: Every day | ORAL | Status: DC
  Administered 2021-11-24 – 2021-11-25 (×2): 400 mg via ORAL
  Filled 2021-11-23 (×3): qty 1

## 2021-11-23 MED ORDER — SODIUM CHLORIDE 0.9 % IV SOLN: INTRAVENOUS | Status: DC

## 2021-11-23 MED ORDER — CARBAMAZEPINE 200 MG PO TABS
300.0000 mg | ORAL_TABLET | Freq: Two times a day (BID) | ORAL | Status: DC
  Administered 2021-11-23 – 2021-11-25 (×4): 300 mg via ORAL
  Filled 2021-11-23 (×6): qty 1.5

## 2021-11-23 NOTE — H&P (Signed)
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 menorrhagia resulting and chronic iron deficiency anemia requiring iron transfusions. EXAM: 1. Ultrasound-guided vascular access of the left radial artery. 2. Pelvic angiogram. 3. Selective catheterization of bilateral uterine arteries. 4. Particle embolization of the bilateral uterine arteries. MEDICATIONS: Vancomycin 1.5 IV. The antibiotic was administered within 1 hour of the procedure. 8 mg Zofran, intravenous. 1 g intravenous Tylenol. ANESTHESIA/SEDATION: Moderate (conscious) sedation was employed during this procedure. A total of Versed 0.5 mg and Fentanyl 50 mcg was administered intravenously. Moderate Sedation Time: 82 minutes. The patient's level of consciousness and vital signs were monitored continuously by radiology nursing throughout the procedure under my direct supervision. CONTRAST:  51m OMNIPAQUE IOHEXOL 300 MG/ML SOLN, 518mOMNIPAQUE IOHEXOL 300 MG/ML SOLN FLUOROSCOPY TIME:  Fluoroscopy  Time: 20 minutes 42 seconds (1,812 mGy). COMPLICATIONS: None immediate. PROCEDURE: Informed consent was obtained from the patient following explanation of the procedure, risks, benefits and alternatives. The patient understands, agrees and consents for the procedure. All questions were addressed. A time out was performed prior to the initiation of the procedure. Maximal barrier sterile technique utilized including caps, mask, sterile gowns, sterile gloves, large sterile drape, hand hygiene, and Betadine prep. The left wrist was prepped and draped in standard fashion. BaNickolas Madridest was "A". Subdermal Local anesthesia was provided at the planned needle entry site. Under  direct ultrasound visualization, the left radial artery was punctured with a 4 cm micropuncture needle. A permanent image was captured and stored. A micropuncture wire was inserted without difficulty. Additional local anesthetic was administered and a small skin nick was made. The needle was removed and a 4/5 French slender sheath was inserted. A radial cocktail was slowly administered. Through the sheath, a 5 French MG2 Glide catheter was inserted over a Bentson wire under direct fluoroscopic guidance from the left upper extremity to the abdominal aorta. The right common and internal iliac arteries were selected. Right internal iliac angiogram was then performed which demonstrated a patent uterine artery arising from the anterior division. A Renegade high flow and 0.016 fathom wire were used to select the right uterine artery. The horizontal portion of the uterine artery was catheterized and angiogram was performed. The right hemi uterus was visualized with at least 1 hyperenhancing mass compatible with a uterine fibroid. There was no evidence of vesicular, rectal, or vaginal branches. Embolization was then performed with 500-700 micron embospheres. After injection of approximately 1 vial, there was noted to be sluggish flow in the uterine artery. 3 mL  of preservative-free lidocaine were then injected intra-arterially. The microcatheter was then removed. The 5 French catheter was then retracted and used to select the left common and internal iliac artery. Left internal iliac angiogram demonstrated a patent uterine artery arising from the anterior division. A Renegade high flow and 0.016 fathom wire were used to select the left uterine artery. The horizontal portion of the uterine artery was catheterized and angiogram was performed. The left hemi uterus was visualized with at least 1 hyperenhancing mass compatible with a uterine fibroid. There was no evidence of vesicular, rectal, or vaginal branches. Embolization was then performed with 500-700 micron embospheres. After injection of approximately 1 vial, there was noted to be sluggish flow in the uterine artery. 3 mL of preservative-free lidocaine were then injected intra-arterially. The microcatheter was then removed. The 5 French catheter was then retracted to the abdominal aorta. Pelvic angiogram was then performed which demonstrated widely patent bilateral inflow vessels with relative pruning of the previously visualized bilateral uterine arteries. The catheter was then removed. A TR band was applied to the left radial sheath which was then removed without complication. The patient tolerated procedure well was transferred to the post procedural area in good condition. IMPRESSION: Technically successful bilateral uterine artery embolization. Ruthann Cancer, MD [facility] Electronically Signed   By: Ruthann Cancer M.D.   On: 11/04/2021 14:53   IR US Guide Vasc Access Left  Result Date: 11/04/2021 INDICATION: 45 year old female with history of uterine fibroids and menorrhagia resulting and chronic iron deficiency anemia requiring iron transfusions. EXAM: 1. Ultrasound-guided vascular access of the left radial artery. 2. Pelvic angiogram. 3. Selective  catheterization of bilateral uterine arteries. 4. Particle embolization of the bilateral uterine arteries. MEDICATIONS: Vancomycin 1.5 IV. The antibiotic was administered within 1 hour of the procedure. 8 mg Zofran, intravenous. 1 g intravenous Tylenol. ANESTHESIA/SEDATION: Moderate (conscious) sedation was employed during this procedure. A total of Versed 0.5 mg and Fentanyl 50 mcg was administered intravenously. Moderate Sedation Time: 82 minutes. The patient's level of consciousness and vital signs were monitored continuously by radiology nursing throughout the procedure under my direct supervision. CONTRAST:  18m OMNIPAQUE IOHEXOL 300 MG/ML SOLN, 541mOMNIPAQUE IOHEXOL 300 MG/ML SOLN FLUOROSCOPY TIME:  Fluoroscopy Time: 20 minutes 42 seconds (1,812 mGy). COMPLICATIONS: None immediate. PROCEDURE: Informed consent was obtained  from the patient following explanation of the procedure, risks, benefits and alternatives. The patient understands, agrees and consents for the procedure. All questions were addressed. A time out was performed prior to the initiation of the procedure. Maximal barrier sterile technique utilized including caps, mask, sterile gowns, sterile gloves, large sterile drape, hand hygiene, and Betadine prep. The left wrist was prepped and draped in standard fashion. Nickolas Madrid test was "A". Subdermal Local anesthesia was provided at the planned needle entry site. Under direct ultrasound visualization, the left radial artery was punctured with a 4 cm micropuncture needle. A permanent image was captured and stored. A micropuncture wire was inserted without difficulty. Additional local anesthetic was administered and a small skin nick was made. The needle was removed and a 4/5 French slender sheath was inserted. A radial cocktail was slowly administered. Through the sheath, a 5 French MG2 Glide catheter was inserted over a Bentson wire under direct fluoroscopic guidance from the left upper extremity to the  abdominal aorta. The right common and internal iliac arteries were selected. Right internal iliac angiogram was then performed which demonstrated a patent uterine artery arising from the anterior division. A Renegade high flow and 0.016 fathom wire were used to select the right uterine artery. The horizontal portion of the uterine artery was catheterized and angiogram was performed. The right hemi uterus was visualized with at least 1 hyperenhancing mass compatible with a uterine fibroid. There was no evidence of vesicular, rectal, or vaginal branches. Embolization was then performed with 500-700 micron embospheres. After injection of approximately 1 vial, there was noted to be sluggish flow in the uterine artery. 3 mL of preservative-free lidocaine were then injected intra-arterially. The microcatheter was then removed. The 5 French catheter was then retracted and used to select the left common and internal iliac artery. Left internal iliac angiogram demonstrated a patent uterine artery arising from the anterior division. A Renegade high flow and 0.016 fathom wire were used to select the left uterine artery. The horizontal portion of the uterine artery was catheterized and angiogram was performed. The left hemi uterus was visualized with at least 1 hyperenhancing mass compatible with a uterine fibroid. There was no evidence of vesicular, rectal, or vaginal branches. Embolization was then performed with 500-700 micron embospheres. After injection of approximately 1 vial, there was noted to be sluggish flow in the uterine artery. 3 mL of preservative-free lidocaine were then injected intra-arterially. The microcatheter was then removed. The 5 French catheter was then retracted to the abdominal aorta. Pelvic angiogram was then performed which demonstrated widely patent bilateral inflow vessels with relative pruning of the previously visualized bilateral uterine arteries. The catheter was then removed. A TR band was  applied to the left radial sheath which was then removed without complication. The patient tolerated procedure well was transferred to the post procedural area in good condition. IMPRESSION: Technically successful bilateral uterine artery embolization. Ruthann Cancer, MD [facility]iology Electronically Signed   By: Ruthann Cancer M.D.   On: 11/04/2021 14:53   IR EMBO TUMOR ORGAN ISCHEMIA INFARCT INC GUIDE ROADMAPPING  Result Date: 11/04/2021 INDICATION: 45 year old female with history of uterine fibroids and menorrhagia resulting and chronic iron deficiency anemia requiring iron transfusions. EXAM: 1. Ultrasound-guided vascular access of the left radial artery. 2. Pelvic angiogram. 3. Selective catheterization of bilateral uterine arteries. 4. Particle embolization of the bilateral uterine arteries. MEDICATIONS: Vancomycin 1.5 IV. The antibiotic was administered within 1 hour of the procedure. 8 mg Zofran,  intravenous. 1 g intravenous Tylenol. ANESTHESIA/SEDATION: Moderate (conscious) sedation was employed during this procedure. A total of Versed 0.5 mg and Fentanyl 50 mcg was administered intravenously. Moderate Sedation Time: 82 minutes. The patient's level of consciousness and vital signs were monitored continuously by radiology nursing throughout the procedure under my direct supervision. CONTRAST:  80m OMNIPAQUE IOHEXOL 300 MG/ML SOLN, 572mOMNIPAQUE IOHEXOL 300 MG/ML SOLN FLUOROSCOPY TIME:  Fluoroscopy Time: 20 minutes 42 seconds (1,812 mGy). COMPLICATIONS: None immediate. PROCEDURE: Informed consent was obtained from the patient following explanation of the procedure, risks, benefits and alternatives. The patient understands, agrees and consents for the procedure. All questions were addressed. A time out was performed prior to the initiation of the procedure. Maximal barrier sterile technique utilized including caps, mask, sterile gowns, sterile gloves,  large sterile drape, hand hygiene, and Betadine prep. The left wrist was prepped and draped in standard fashion. BaNickolas Madridest was "A". Subdermal Local anesthesia was provided at the planned needle entry site. Under direct ultrasound visualization, the left radial artery was punctured with a 4 cm micropuncture needle. A permanent image was captured and stored. A micropuncture wire was inserted without difficulty. Additional local anesthetic was administered and a small skin nick was made. The needle was removed and a 4/5 French slender sheath was inserted. A radial cocktail was slowly administered. Through the sheath, a 5 French MG2 Glide catheter was inserted over a Bentson wire under direct fluoroscopic guidance from the left upper extremity to the abdominal aorta. The right common and internal iliac arteries were selected. Right internal iliac angiogram was then performed which demonstrated a patent uterine artery arising from the anterior division. A Renegade high flow and 0.016 fathom wire were used to select the right uterine artery. The horizontal portion of the uterine artery was catheterized and angiogram was performed. The right hemi uterus was visualized with at least 1 hyperenhancing mass compatible with a uterine fibroid. There was no evidence of vesicular, rectal, or vaginal branches. Embolization was then performed with 500-700 micron embospheres. After injection of approximately 1 vial, there was noted to be sluggish flow in the uterine artery. 3 mL of preservative-free lidocaine were then injected intra-arterially. The microcatheter was then removed. The 5 French catheter was then retracted and used to select the left common and internal iliac artery. Left internal iliac angiogram demonstrated a patent uterine artery arising from the anterior division. A Renegade high flow and 0.016 fathom wire were used to select the left uterine artery. The horizontal portion of the uterine artery was catheterized  and angiogram was performed. The left hemi uterus was visualized with at least 1 hyperenhancing mass compatible with a uterine fibroid. There was no evidence of vesicular, rectal, or vaginal branches. Embolization was then performed with 500-700 micron embospheres. After injection of approximately 1 vial, there was noted to be sluggish flow in the uterine artery. 3 mL of preservative-free lidocaine were then injected intra-arterially. The microcatheter was then removed. The 5 French catheter was then retracted to the abdominal aorta. Pelvic angiogram was then performed which demonstrated widely patent bilateral inflow vessels with relative pruning of the previously visualized bilateral uterine arteries. The catheter was then removed. A TR band was applied to the left radial sheath which was then removed without complication. The patient tolerated procedure well was transferred to the post procedural area in good condition. IMPRESSION: Technically successful bilateral uterine artery embolization. DyRuthann CancerMD [facility] Electron[facility] Signed   By: DyCamillia Herter  Suttle M.D.   On: 11/04/2021 14:53    Labs:  CBC: Recent Labs    09/05/21 0614 09/06/21 0120 09/07/21 0152 11/04/21 0721  WBC 4.8 4.7 5.0 5.6  HGB 12.1 11.9* 12.5 14.1  HCT 37.9 36.4 39.5 46.0  PLT 191 196 171 211    COAGS: Recent Labs    09/04/21 1344 11/04/21 0721  INR 1.0 1.0  APTT 29  --     BMP: Recent Labs    01/14/21 1150 04/07/21 0000 07/31/21 0000 09/03/21 1546 09/07/21 0152 11/04/21 0721  NA 140   < > 138 137 133* 135  K 4.4   < > 4.0 3.9 3.3* 4.5  CL 109*   < > 107 105 95* 107  CO2 18*   < > 22 20* 26 17*  GLUCOSE 123*  --   --  125* 133* 135*  BUN 8   < > _0 CALCIUM 9.1   < > 8.9 8.7* 9.1 9.4  CREATININE 0.86   < > 1.0 1.12* 0.94 0.81  GFRNONAA 83  --   --  >60 >60 >60  GFRAA 96  --   --   --   --   --    < > = values in this interval not  displayed.    LIVER FUNCTION TESTS: Recent Labs    01/14/21 1150 04/07/21 0000 07/31/21 0000 09/03/21 1546  BILITOT 0.3  --   --  0.6  AST 24 27 46* 76*  ALT 18 21 32 69*  ALKPHOS 108 137* 101 93  PROT 7.0  --   --  6.7  ALBUMIN 4.1 4.3 4.0 3.6    TUMOR MARKERS: No results for input(s): AFPTM, CEA, CA199, CHROMGRNA in the last 8760 hours.  Assessment and Plan: 45 y.o. female with known SVC occlusion with SVC syndrome, who is well known to Dr. Serafina Royals for previous Port-a-cath removal, bilateral [place], and attempted SVC recanalization.   Patient presents to [facility] IR today for venogram with recanalization with possible stent placement.  NPO since MN VS HTN 170/98  CBC stable INR 1.0 RF normal  COVID negative   Patient is allergic to contrast  media, was prescribed 13 hr prep.  Patient's mother verifies that the patient  took the medicines as directed.    Risks and benefits discussed with the patient including, but not limited to bleeding, infection, contrast induced renal failure, pericardial effusion, damage to adjacent structures.   All of the patient and mother's questions were answered, they are  agreeable to proceed. Consent signed and in chart.  POST PROCEDURE PLAN  - Patient will be admitted for overnight observation, plan d/c tomorrow if stable - Patient will be on Lovenox x 2 weeks then transition to Plavix indefinitely    Thank you for this interesting consult.  I greatly enjoyed meeting Jane Edwards and look forward to participating in their care.  A copy of this report was sent to the requesting provider on this date.  Electronically Signed: Tera Mater, PA-C 11/23/2021, 9:35 AM   I spent a total of    40 Minutes in face to face in clinical consultation, greater than 50% of which was counseling/coordinating care for SVC venogram with recanalization and possible stent placement.   This chart was dictated using voice recognition software.  Despite best  efforts to proofread,  errors can occur which can change the documentation meaning.

## 2021-11-23 NOTE — Progress Notes (Signed)
Ok to not get a urine pregnancy test per Dr. Elgie Congo.

## 2021-11-23 NOTE — Procedures (Signed)
Interventional Radiology Procedure Note  Procedure:  1) Right upper and central venography 2) Sharp recanalization of right innominate vein 3) Balloon angioplasty of right innominate and subclavian veins 4) Right subclavian to SVC self-expanding stent placement  Findings: Please refer to procedural dictation for full description.  Complications: None immediate  Estimated Blood Loss: < 10 mL  Recommendations: 1 mg/kg Lovenox administered in IR.  Plan for therapeutic dose Q12H.   Start Plavix 300 mg tonight for loading dose, 75 mg starting tomorrow. Head of bed up to 30 degrees for 2 hours, then no head of bed or ambulatory restrictions. Resume home medications. Admit to IR.   Ruthann Cancer, MD Pager: [phone]

## 2021-11-23 NOTE — Progress Notes (Incomplete)
[facility]  [address] [place],  [place]  [address] [phone]  Clinic Day:  11/30/2021  Referring physician: Leonides Sake, MD  This document serves as a record of services personally performed by Marice Potter, MD. It was created on their behalf by Curry,Lauren E, a trained medical scribe. The creation of this record is based on the scribe's personal observations and the provider's statements to them.  HISTORY OF PRESENT ILLNESS:  The patient is a 45 y.o. female with iron deficiency anemia secondary to heavy menstrual cycles.  In the past, she has received numerous courses of IV Feraheme to offset the severity of her iron deficiency from her menstrual cycles.  Her last course of IV Feraheme was given in August 2022.  She comes in today to reassess her iron and hemoglobin levels.  Since her last visit, she has been doing okay.  Outside of her menstrual cycles, she denies having other overt forms of blood loss.  PHYSICAL EXAM:  There were no vitals taken for this visit. There is no height or weight on file to calculate BMI. Weight 309 lb Performance status (ECOG): 2 Physical Exam Constitutional:      General: She is not in acute distress.    Appearance: Normal appearance. She is normal weight.  HENT:     Head: Normocephalic and atraumatic.  Eyes:     General: No scleral icterus.    Extraocular Movements: Extraocular movements intact.     Conjunctiva/sclera: Conjunctivae normal.     Pupils: Pupils are equal, round, and reactive to light.  Cardiovascular:     Rate and Rhythm: Normal rate and regular rhythm.     Pulses: Normal pulses.     Heart sounds: Normal heart sounds. No murmur heard.   No friction rub. No gallop.  Pulmonary:     Effort: Pulmonary effort is normal. No respiratory distress.     Breath sounds: Normal breath sounds.  Abdominal:     General: Bowel sounds are normal. There is no distension.     Palpations: Abdomen is  soft. There is no hepatomegaly, splenomegaly or mass.     Tenderness: There is no abdominal tenderness.  Musculoskeletal:        General: Normal range of motion.     Cervical back: Normal range of motion and neck supple.     Right lower leg: No edema.     Left lower leg: No edema.  Lymphadenopathy:     Cervical: No cervical adenopathy.  Skin:    General: Skin is warm and dry.  Neurological:     General: No focal deficit present.     Mental Status: She is alert and oriented to person, place, and time. Mental status is at baseline.  Psychiatric:        Mood and Affect: Mood normal.        Behavior: Behavior normal.        Thought Content: Thought content normal.        Judgment: Judgment normal.   LABS:   CBC Latest Ref Rng & Units 11/04/2021 09/07/2021 09/06/2021  WBC 4.0 - 10.5 K/uL 5.6 5.0 4.7  Hemoglobin 12.0 - 15.0 g/dL 14.1 12.5 11.9(L)  Hematocrit 36.0 - 46.0 % 46.0 39.5 36.4  Platelets 150 - 400 K/uL 211 171 196   CMP Latest Ref Rng & Units 11/04/2021 09/07/2021 09/03/2021  Glucose 70 - 99 mg/dL 135(H) 133(H) 125(H)  BUN 6 - 20 mg/dL 9 13  6  Creatinine 0.44 - 1.00 mg/dL 0.81 0.94 1.12(H)  Sodium 135 - 145 mmol/L 135 133(L) 137  Potassium 3.5 - 5.1 mmol/L 4.5 3.3(L) 3.9  Chloride 98 - 111 mmol/L 107 95(L) 105  CO2 22 - 32 mmol/L 17(L) 26 20(L)  Calcium 8.9 - 10.3 mg/dL 9.4 9.1 8.7(L)  Total Protein 6.5 - 8.1 g/dL - - 6.7  Total Bilirubin 0.3 - 1.2 mg/dL - - 0.6  Alkaline Phos 38 - 126 U/L - - 93  AST 15 - 41 U/L - - 76(H)  ALT 0 - 44 U/L - - 69(H)    Ref. Range 07/31/2021 10:45  Iron Latest Ref Range: 28 - 170 ug/dL 40  UIBC Latest Units: ug/dL 411  TIBC Latest Ref Range: 250 - 450 ug/dL 451 (H)  Saturation Ratios Latest Ref Range: 10.4 - 31.8 % 9 (L)  Ferritin Latest Ref Range: 11 - 307 ng/mL 11    ASSESSMENT & PLAN:  Assessment/Plan:  A 45 y.o. female with iro deficiency anemia.  When evaluating her labs today, I am pleased as her hemoglobin is definitely  improved.  However, she clearly has persistent iron deficiency for which I will arrange for her to receive IV iron in the forthcoming weeks. Otherwise, I will see her back in 4 months for repeat clinical assessment.   The patient understands all the plans discussed today and is in agreement with them.     I, Rita Ohara, am acting as scribe for Marice Potter, MD    I have reviewed this report as typed by the medical scribe, and it is complete and accurate.  Dequincy Macarthur Critchley, MD

## 2021-11-23 NOTE — Progress Notes (Addendum)
ADDENDUM TO H&P  Patient will be on Lovenox 134 mg (mg/kg) BID AND Plavix 75 mg qd for 2 weeks, then patient will be on Plavix 75 mg qd only.   Please call IR for questions and concerns.  Armando Gang Sallie Staron PA-C 11/23/2021 11:46 AM

## 2021-11-23 NOTE — Anesthesia Preprocedure Evaluation (Addendum)
Anesthesia Evaluation  Patient identified by MRN, date of birth, ID band Patient awake    Reviewed: Allergy & Precautions, NPO status , Patient's Chart, lab work & pertinent test results  Airway Mallampati: III  TM Distance: >3 FB Neck ROM: Full    Dental  (+) Teeth Intact, Dental Advisory Given   Pulmonary asthma , sleep apnea and Continuous Positive Airway Pressure Ventilation ,    Pulmonary exam normal breath sounds clear to auscultation       Cardiovascular hypertension, Pt. on medications and Pt. on home beta blockers Normal cardiovascular exam+ dysrhythmias (tachycardia- on metoprolol)  Rhythm:Regular Rate:Normal  Echo 09/06/21: 1. Left ventricular ejection fraction, by estimation, is 60 to 65%. The  left ventricle has normal function. The left ventricle has no regional  wall motion abnormalities. Left ventricular diastolic parameters were  normal.  2. Right ventricular systolic function is normal. The right ventricular  size is normal. Tricuspid regurgitation signal is inadequate for assessing  PA pressure.  3. The mitral valve is grossly normal. Trivial mitral valve  regurgitation. No evidence of mitral stenosis.  4. The aortic valve was not well visualized. Aortic valve regurgitation  is not visualized. No aortic stenosis is present.    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   Anesthesia Other Findings SVC syndrome   Dye allergy- prednisone, benadryl preop  Reproductive/Obstetrics negative OB ROS                            Anesthesia Physical Anesthesia Plan  ASA: 4  Anesthesia Plan: General   Post-op Pain Management:    Induction: Intravenous  PONV Risk Score and Plan: 3 and Ondansetron, Dexamethasone, Treatment may vary due to age or medical condition and Midazolam  Airway Management Planned: Oral ETT  Additional Equipment: None  Intra-op Plan:   Post-operative Plan: Extubation in OR  Informed Consent: I have reviewed the patients History and Physical, chart, labs and discussed the procedure including the risks, benefits and alternatives for the proposed anesthesia with the patient or authorized representative who has indicated his/her understanding and acceptance.     Dental advisory given  Plan Discussed with: CRNA  Anesthesia Plan Comments:       Anesthesia Quick Evaluation

## 2021-11-23 NOTE — Transfer of Care (Signed)
Immediate Anesthesia Transfer of Care Note  Patient: Starasia Sinko  Procedure(s) Performed: IR WITH ANESTHESIA STENT PLACEMENT  Patient Location: PACU  Anesthesia Type:General  Level of Consciousness: drowsy and patient cooperative  Airway & Oxygen Therapy: Patient Spontanous Breathing and Patient connected to nasal cannula oxygen  Post-op Assessment: Report given to RN and Post -op Vital signs reviewed and stable  Post vital signs: Reviewed and stable  Last Vitals:  Vitals Value Taken Time  BP 135/70 11/23/21 1823  Temp    Pulse 83 11/23/21 1824  Resp    SpO2 99 % 11/23/21 1824  Vitals shown include unvalidated device data.  Last Pain:  Vitals:   11/23/21 0934  TempSrc:   PainSc: 3       Patients Stated Pain Goal: 3 (52/84/13 2440)  Complications: No notable events documented.

## 2021-11-23 NOTE — Anesthesia Procedure Notes (Addendum)
Arterial Line Insertion Start/End12/04/2021 11:00 AM, 11/23/2021 11:15 AM Performed by: Pervis Hocking, DO, anesthesiologist  Patient location: Pre-op. Preanesthetic checklist: patient identified, IV checked, site marked, risks and benefits discussed, surgical consent, monitors and equipment checked, pre-op evaluation, timeout performed and anesthesia consent Lidocaine 1% used for infiltration Left, brachial was placed Catheter size: 20 G Hand hygiene performed  and maximum sterile barriers used   Attempts: 4 Procedure performed without using ultrasound guided technique. Following insertion, dressing applied and line sutured. Post procedure assessment: normal and unchanged  Post procedure complications: unsuccessful attempts and second provider assisted. Patient tolerated the procedure well with no immediate complications.

## 2021-11-23 NOTE — H&P (Deleted)
  The note originally documented on this encounter has been moved the the encounter in which it belongs.  

## 2021-11-23 NOTE — Anesthesia Procedure Notes (Signed)
Procedure Name: Intubation Date/Time: 11/23/2021 1:36 PM Performed by: Janace Litten, CRNA Pre-anesthesia Checklist: Patient identified, Emergency Drugs available, Suction available and Patient being monitored Patient Re-evaluated:Patient Re-evaluated prior to induction Oxygen Delivery Method: Circle System Utilized Preoxygenation: Pre-oxygenation with 100% oxygen Induction Type: IV induction Ventilation: Mask ventilation without difficulty Laryngoscope Size: Mac and 4 Grade View: Grade I Tube type: Oral Tube size: 7.0 mm Number of attempts: 2 Airway Equipment and Method: Stylet Placement Confirmation: ETT inserted through vocal cords under direct vision, positive ETCO2 and breath sounds checked- equal and bilateral Secured at: 23 cm Tube secured with: Tape Dental Injury: Teeth and Oropharynx as per pre-operative assessment  Comments: DLx1 with MAC 4 by CRNA - grade 1 view - easy intubation; ETT positioned to left corner of mouth (causing extubation/immediate recognition of esophageal intubation); DLx2 with MAC 4 by CRNA - grade I view, easy intubation. BBS=, +ETCO2, VSS.

## 2021-11-24 ENCOUNTER — Encounter (HOSPITAL_COMMUNITY): Payer: Self-pay | Admitting: Interventional Radiology

## 2021-11-24 DIAGNOSIS — Z91040 Latex allergy status: Secondary | ICD-10-CM | POA: Diagnosis not present

## 2021-11-24 DIAGNOSIS — Z91041 Radiographic dye allergy status: Secondary | ICD-10-CM | POA: Diagnosis not present

## 2021-11-24 DIAGNOSIS — I110 Hypertensive heart disease with heart failure: Secondary | ICD-10-CM | POA: Diagnosis not present

## 2021-11-24 DIAGNOSIS — Z79899 Other long term (current) drug therapy: Secondary | ICD-10-CM | POA: Diagnosis not present

## 2021-11-24 DIAGNOSIS — I509 Heart failure, unspecified: Secondary | ICD-10-CM | POA: Diagnosis not present

## 2021-11-24 DIAGNOSIS — Z20822 Contact with and (suspected) exposure to covid-19: Secondary | ICD-10-CM | POA: Diagnosis not present

## 2021-11-24 DIAGNOSIS — E119 Type 2 diabetes mellitus without complications: Secondary | ICD-10-CM | POA: Diagnosis not present

## 2021-11-24 DIAGNOSIS — I871 Compression of vein: Secondary | ICD-10-CM | POA: Diagnosis not present

## 2021-11-24 LAB — CBC
HCT: 40.4 % (ref 36.0–46.0)
Hemoglobin: 13.1 g/dL (ref 12.0–15.0)
MCH: 28.1 pg (ref 26.0–34.0)
MCHC: 32.4 g/dL (ref 30.0–36.0)
MCV: 86.5 fL (ref 80.0–100.0)
Platelets: 174 10*3/uL (ref 150–400)
RBC: 4.67 MIL/uL (ref 3.87–5.11)
RDW: 13 % (ref 11.5–15.5)
WBC: 10 10*3/uL (ref 4.0–10.5)
nRBC: 0 % (ref 0.0–0.2)

## 2021-11-24 LAB — BASIC METABOLIC PANEL
Anion gap: 10 (ref 5–15)
BUN: 9 mg/dL (ref 6–20)
CO2: 21 mmol/L — ABNORMAL LOW (ref 22–32)
Calcium: 8.7 mg/dL — ABNORMAL LOW (ref 8.9–10.3)
Chloride: 103 mmol/L (ref 98–111)
Creatinine, Ser: 0.97 mg/dL (ref 0.44–1.00)
GFR, Estimated: >60 mL/min (ref >60)
Glucose, Bld: 126 mg/dL — ABNORMAL HIGH (ref 70–99)
Potassium: 4 mmol/L (ref 3.5–5.1)
Sodium: 134 mmol/L — ABNORMAL LOW (ref 135–145)

## 2021-11-24 LAB — GLUCOSE, CAPILLARY
Glucose-Capillary: 153 mg/dL — ABNORMAL HIGH (ref 70–99)
Glucose-Capillary: 150 mg/dL — ABNORMAL HIGH (ref 70–99)

## 2021-11-24 MED ORDER — CLOPIDOGREL BISULFATE 75 MG PO TABS: 75.0000 mg | ORAL_TABLET | Freq: Every day | ORAL | 0 refills | Status: AC

## 2021-11-24 MED ORDER — ACETAMINOPHEN 500 MG PO TABS
1000.0000 mg | ORAL_TABLET | Freq: Four times a day (QID) | ORAL | Status: DC
  Administered 2021-11-24 – 2021-11-25 (×4): 1000 mg via ORAL
  Filled 2021-11-24 (×4): qty 2

## 2021-11-24 MED ORDER — INSULIN ASPART 100 UNIT/ML IJ SOLN
0.0000 [IU] | Freq: Three times a day (TID) | INTRAMUSCULAR | Status: DC
Start: 2021-11-24 — End: 2021-11-25
  Administered 2021-11-25: 4 [IU] via SUBCUTANEOUS

## 2021-11-24 MED ORDER — ENOXAPARIN SODIUM 150 MG/ML IJ SOSY: 140.0000 mg | PREFILLED_SYRINGE | Freq: Two times a day (BID) | INTRAMUSCULAR | 0 refills | Status: AC

## 2021-11-24 NOTE — Anesthesia Postprocedure Evaluation (Signed)
Anesthesia Post Note  Patient: Jane Edwards  Procedure(s) Performed: IR WITH ANESTHESIA [place]     Patient location during evaluation: PACU Anesthesia Type: General Level of consciousness: patient cooperative and awake Pain management: pain level controlled Vital Signs Assessment: post-procedure vital signs reviewed and stable Respiratory status: spontaneous breathing, nonlabored ventilation, respiratory function stable and patient connected to nasal cannula oxygen Cardiovascular status: blood pressure returned to baseline and stable Postop Assessment: no apparent nausea or vomiting Anesthetic complications: no   No notable events documented.  Last Vitals:  Vitals:   11/24/21 0455 11/24/21 0815  BP: (!) 147/82 118/63  Pulse: 78 83  Resp: 17 19  Temp: 36.6 C 36.5 C  SpO2: 99% 97%    Last Pain:  Vitals:   11/24/21 1331  TempSrc:   PainSc: 3                  Dakai Braithwaite

## 2021-11-24 NOTE — Progress Notes (Signed)
Patient seen with Dr. Serafina Royals at the bedside.   Patient reports she is doing ok, unsure if her upper body swelling is better but the pooping in her right ears has been gone since the procedure.  She reports 9 out of 10 persistent sharp chest pain at upper central, slightly towards to the right.   Right neck, groin, and right arm puncture sites shows no signs of acute bleeding or hematoma.   A/P  Central CP - 1g acetaminophen q6h, will reevaluate the patient this afternoon to see if she is ready to be discharged.  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 Serafina Royals. Patient will be on Plavix 75 mg indefinitely.   Please call IR for questions and concerns.  Armando Gang Chais Fehringer PA-C 11/24/2021 9:33 AM

## 2021-11-25 DIAGNOSIS — I82210 Acute embolism and thrombosis of superior vena cava: Secondary | ICD-10-CM | POA: Diagnosis not present

## 2021-11-25 DIAGNOSIS — E119 Type 2 diabetes mellitus without complications: Secondary | ICD-10-CM | POA: Diagnosis not present

## 2021-11-25 DIAGNOSIS — I82B11 Acute embolism and thrombosis of right subclavian vein: Secondary | ICD-10-CM | POA: Diagnosis not present

## 2021-11-25 DIAGNOSIS — Z20822 Contact with and (suspected) exposure to covid-19: Secondary | ICD-10-CM | POA: Diagnosis not present

## 2021-11-25 DIAGNOSIS — Z79899 Other long term (current) drug therapy: Secondary | ICD-10-CM | POA: Diagnosis not present

## 2021-11-25 DIAGNOSIS — Z91041 Radiographic dye allergy status: Secondary | ICD-10-CM | POA: Diagnosis not present

## 2021-11-25 DIAGNOSIS — I110 Hypertensive heart disease with heart failure: Secondary | ICD-10-CM | POA: Diagnosis not present

## 2021-11-25 DIAGNOSIS — I509 Heart failure, unspecified: Secondary | ICD-10-CM | POA: Diagnosis not present

## 2021-11-25 DIAGNOSIS — Z91040 Latex allergy status: Secondary | ICD-10-CM | POA: Diagnosis not present

## 2021-11-25 DIAGNOSIS — I871 Compression of vein: Secondary | ICD-10-CM | POA: Diagnosis not present

## 2021-11-25 LAB — GLUCOSE, CAPILLARY
Glucose-Capillary: 103 mg/dL — ABNORMAL HIGH (ref 70–99)
Glucose-Capillary: 182 mg/dL — ABNORMAL HIGH (ref 70–99)

## 2021-11-25 NOTE — Plan of Care (Signed)
Patient ID: Jane Edwards, female   DOB: [birth year], 45 y.o.   MRN: [id number]  Problem: Education: Goal: Knowledge of General Education information will improve Description: Including pain rating scale, medication(s)/side effects and non-pharmacologic comfort measures Outcome: Adequate for Discharge   Problem: Health Behavior/Discharge Planning: Goal: Ability to manage health-related needs will improve Outcome: Adequate for Discharge   Problem: Clinical Measurements: Goal: Ability to maintain clinical measurements within normal limits will improve Outcome: Adequate for Discharge Goal: Will remain free from infection Outcome: Adequate for Discharge Goal: Diagnostic test results will improve Outcome: Adequate for Discharge Goal: Respiratory complications will improve Outcome: Adequate for Discharge Goal: Cardiovascular complication will be avoided Outcome: Adequate for Discharge   Problem: Activity: Goal: Risk for activity intolerance will decrease Outcome: Adequate for Discharge   Problem: Nutrition: Goal: Adequate nutrition will be maintained Outcome: Adequate for Discharge   Problem: Coping: Goal: Level of anxiety will decrease Outcome: Adequate for Discharge   Problem: Elimination: Goal: Will not experience complications related to bowel motility Outcome: Adequate for Discharge Goal: Will not experience complications related to urinary retention Outcome: Adequate for Discharge   Problem: Pain Managment: Goal: General experience of comfort will improve Outcome: Adequate for Discharge   Problem: Safety: Goal: Ability to remain free from injury will improve Outcome: Adequate for Discharge   Problem: Skin Integrity: Goal: Risk for impaired skin integrity will decrease Outcome: Adequate for Discharge   Haydee Salter, RN
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
## 2021-11-25 NOTE — Progress Notes (Signed)
Patient ID: Jane Edwards, female   DOB: December 23, 1975, 45 y.o.   MRN: [id number]  An After Visit Summary was printed and given to the patient.   Patient education given on medication changes and follow up appointments and the patient expresses understanding and acceptance of instructions.   Patient to ride home with parents.  Haydee Salter 11/25/2021 3:16 PM

## 2021-11-25 NOTE — Discharge Instructions (Addendum)
Please resume home medications.  Please take Plavix and Lovenox as prescribed.  Avoid strenuous activity until after evaluated in follow-up by Dr. Serafina Royals.  Please call [phone] or [phone] with any questions.

## 2021-11-30 ENCOUNTER — Inpatient Hospital Stay: Payer: Medicare Other | Attending: Oncology | Admitting: Hematology and Oncology

## 2021-11-30 ENCOUNTER — Encounter: Payer: Self-pay | Admitting: Hematology and Oncology

## 2021-11-30 ENCOUNTER — Telehealth: Payer: Self-pay | Admitting: Hematology and Oncology

## 2021-11-30 ENCOUNTER — Inpatient Hospital Stay: Payer: Medicare Other

## 2021-11-30 DIAGNOSIS — D500 Iron deficiency anemia secondary to blood loss (chronic): Secondary | ICD-10-CM

## 2021-11-30 DIAGNOSIS — Z66 Do not resuscitate: Secondary | ICD-10-CM | POA: Diagnosis not present

## 2021-11-30 DIAGNOSIS — J9601 Acute respiratory failure with hypoxia: Secondary | ICD-10-CM | POA: Diagnosis not present

## 2021-11-30 DIAGNOSIS — E1159 Type 2 diabetes mellitus with other circulatory complications: Secondary | ICD-10-CM | POA: Diagnosis not present

## 2021-11-30 DIAGNOSIS — E872 Acidosis, unspecified: Secondary | ICD-10-CM | POA: Diagnosis not present

## 2021-11-30 DIAGNOSIS — D649 Anemia, unspecified: Secondary | ICD-10-CM | POA: Diagnosis not present

## 2021-11-30 DIAGNOSIS — T8144XA Sepsis following a procedure, initial encounter: Secondary | ICD-10-CM | POA: Diagnosis not present

## 2021-11-30 DIAGNOSIS — Z20822 Contact with and (suspected) exposure to covid-19: Secondary | ICD-10-CM | POA: Diagnosis not present

## 2021-11-30 DIAGNOSIS — I871 Compression of vein: Secondary | ICD-10-CM | POA: Diagnosis not present

## 2021-11-30 DIAGNOSIS — Z95820 Peripheral vascular angioplasty status with implants and grafts: Secondary | ICD-10-CM | POA: Diagnosis not present

## 2021-11-30 DIAGNOSIS — D62 Acute posthemorrhagic anemia: Secondary | ICD-10-CM | POA: Diagnosis not present

## 2021-11-30 DIAGNOSIS — R34 Anuria and oliguria: Secondary | ICD-10-CM | POA: Diagnosis not present

## 2021-11-30 DIAGNOSIS — F32A Depression, unspecified: Secondary | ICD-10-CM | POA: Diagnosis not present

## 2021-11-30 DIAGNOSIS — G9341 Metabolic encephalopathy: Secondary | ICD-10-CM | POA: Diagnosis not present

## 2021-11-30 DIAGNOSIS — E7800 Pure hypercholesterolemia, unspecified: Secondary | ICD-10-CM | POA: Diagnosis not present

## 2021-11-30 DIAGNOSIS — Z515 Encounter for palliative care: Secondary | ICD-10-CM | POA: Diagnosis not present

## 2021-11-30 DIAGNOSIS — N179 Acute kidney failure, unspecified: Secondary | ICD-10-CM | POA: Diagnosis not present

## 2021-11-30 DIAGNOSIS — I97638 Postprocedural hematoma of a circulatory system organ or structure following other circulatory system procedure: Secondary | ICD-10-CM | POA: Diagnosis not present

## 2021-11-30 DIAGNOSIS — I110 Hypertensive heart disease with heart failure: Secondary | ICD-10-CM | POA: Diagnosis not present

## 2021-11-30 DIAGNOSIS — S301XXA Contusion of abdominal wall, initial encounter: Secondary | ICD-10-CM | POA: Diagnosis not present

## 2021-11-30 DIAGNOSIS — I5033 Acute on chronic diastolic (congestive) heart failure: Secondary | ICD-10-CM | POA: Diagnosis not present

## 2021-11-30 DIAGNOSIS — R578 Other shock: Secondary | ICD-10-CM | POA: Diagnosis not present

## 2021-11-30 DIAGNOSIS — T8141XA Infection following a procedure, superficial incisional surgical site, initial encounter: Secondary | ICD-10-CM | POA: Diagnosis not present

## 2021-11-30 DIAGNOSIS — A419 Sepsis, unspecified organism: Secondary | ICD-10-CM | POA: Diagnosis not present

## 2021-11-30 DIAGNOSIS — I97618 Postprocedural hemorrhage and hematoma of a circulatory system organ or structure following other circulatory system procedure: Secondary | ICD-10-CM | POA: Diagnosis not present

## 2021-11-30 LAB — CBC AND DIFFERENTIAL
HCT: 37 (ref 36–46)
Hemoglobin: 12.2 (ref 12.0–16.0)
Neutrophils Absolute: 4.2
Platelets: 161 (ref 150–399)
WBC: 5.6

## 2021-11-30 LAB — IRON AND TIBC
Iron: 130 ug/dL (ref 28–170)
Saturation Ratios: 33 % — ABNORMAL HIGH (ref 10.4–31.8)
TIBC: 390 ug/dL (ref 250–450)
UIBC: 260 ug/dL

## 2021-11-30 LAB — LIPID PANEL
Cholesterol: 199 (ref 0–200)
HDL: 73 — AB (ref 35–70)
LDL Cholesterol: 103
Triglycerides: 114 (ref 40–160)

## 2021-11-30 LAB — FERRITIN: Ferritin: 107 ng/mL (ref 11–307)

## 2021-11-30 LAB — CBC: RBC: 4.3 (ref 3.87–5.11)

## 2021-11-30 NOTE — Telephone Encounter (Signed)
Per 12/12 los next appt scheduled and given to patient 

## 2021-11-30 NOTE — Assessment & Plan Note (Addendum)
Hemoglobin today is 12.2 with iron studies normal. She most recently underwent uterine ablation in hopes of decreasing her menstrual bleeding. She also recently underwent stent placement and management of SVC.

## 2021-11-30 NOTE — Progress Notes (Signed)
CARDIAC STENT PLACED 11/23/21 FOR SVC SYNDROME.  RECENT UTERINE ABLATION.

## 2021-11-30 NOTE — Progress Notes (Signed)
Patient brought her own Lovenox injection with her to the clinic to make sure she was administering it properly. Instructed patient on how to administer it. No issues noted.

## 2021-12-01 ENCOUNTER — Inpatient Hospital Stay (HOSPITAL_COMMUNITY): Payer: Medicare Other

## 2021-12-01 ENCOUNTER — Inpatient Hospital Stay (HOSPITAL_COMMUNITY)
Admission: EM | Admit: 2021-12-01 | Discharge: 2021-12-20 | DRG: 856 | Disposition: E | Payer: Medicare Other | Attending: Pulmonary Disease | Admitting: Pulmonary Disease

## 2021-12-01 ENCOUNTER — Emergency Department (HOSPITAL_COMMUNITY): Payer: Medicare Other

## 2021-12-01 ENCOUNTER — Emergency Department (HOSPITAL_BASED_OUTPATIENT_CLINIC_OR_DEPARTMENT_OTHER): Payer: Medicare Other

## 2021-12-01 ENCOUNTER — Encounter: Payer: Self-pay | Admitting: Hematology and Oncology

## 2021-12-01 DIAGNOSIS — F32A Depression, unspecified: Secondary | ICD-10-CM | POA: Diagnosis present

## 2021-12-01 DIAGNOSIS — Z7901 Long term (current) use of anticoagulants: Secondary | ICD-10-CM

## 2021-12-01 DIAGNOSIS — Z743 Need for continuous supervision: Secondary | ICD-10-CM | POA: Diagnosis not present

## 2021-12-01 DIAGNOSIS — Y92009 Unspecified place in unspecified non-institutional (private) residence as the place of occurrence of the external cause: Secondary | ICD-10-CM | POA: Diagnosis not present

## 2021-12-01 DIAGNOSIS — D649 Anemia, unspecified: Secondary | ICD-10-CM | POA: Diagnosis not present

## 2021-12-01 DIAGNOSIS — R579 Shock, unspecified: Secondary | ICD-10-CM | POA: Diagnosis present

## 2021-12-01 DIAGNOSIS — S301XXA Contusion of abdominal wall, initial encounter: Secondary | ICD-10-CM | POA: Diagnosis not present

## 2021-12-01 DIAGNOSIS — I97638 Postprocedural hematoma of a circulatory system organ or structure following other circulatory system procedure: Secondary | ICD-10-CM | POA: Diagnosis present

## 2021-12-01 DIAGNOSIS — Z91040 Latex allergy status: Secondary | ICD-10-CM

## 2021-12-01 DIAGNOSIS — Z66 Do not resuscitate: Secondary | ICD-10-CM | POA: Diagnosis present

## 2021-12-01 DIAGNOSIS — R410 Disorientation, unspecified: Secondary | ICD-10-CM | POA: Diagnosis not present

## 2021-12-01 DIAGNOSIS — M1990 Unspecified osteoarthritis, unspecified site: Secondary | ICD-10-CM | POA: Diagnosis present

## 2021-12-01 DIAGNOSIS — J9601 Acute respiratory failure with hypoxia: Secondary | ICD-10-CM | POA: Diagnosis not present

## 2021-12-01 DIAGNOSIS — Z6841 Body Mass Index (BMI) 40.0 and over, adult: Secondary | ICD-10-CM

## 2021-12-01 DIAGNOSIS — Z20822 Contact with and (suspected) exposure to covid-19: Secondary | ICD-10-CM | POA: Diagnosis present

## 2021-12-01 DIAGNOSIS — Z79899 Other long term (current) drug therapy: Secondary | ICD-10-CM | POA: Diagnosis not present

## 2021-12-01 DIAGNOSIS — T8141XA Infection following a procedure, superficial incisional surgical site, initial encounter: Secondary | ICD-10-CM | POA: Diagnosis present

## 2021-12-01 DIAGNOSIS — R34 Anuria and oliguria: Secondary | ICD-10-CM | POA: Diagnosis present

## 2021-12-01 DIAGNOSIS — I10 Essential (primary) hypertension: Secondary | ICD-10-CM | POA: Diagnosis not present

## 2021-12-01 DIAGNOSIS — I871 Compression of vein: Secondary | ICD-10-CM | POA: Diagnosis present

## 2021-12-01 DIAGNOSIS — E662 Morbid (severe) obesity with alveolar hypoventilation: Secondary | ICD-10-CM | POA: Diagnosis present

## 2021-12-01 DIAGNOSIS — E872 Acidosis, unspecified: Secondary | ICD-10-CM | POA: Diagnosis present

## 2021-12-01 DIAGNOSIS — E875 Hyperkalemia: Secondary | ICD-10-CM | POA: Diagnosis present

## 2021-12-01 DIAGNOSIS — N179 Acute kidney failure, unspecified: Secondary | ICD-10-CM | POA: Diagnosis present

## 2021-12-01 DIAGNOSIS — Z86718 Personal history of other venous thrombosis and embolism: Secondary | ICD-10-CM

## 2021-12-01 DIAGNOSIS — G9340 Encephalopathy, unspecified: Secondary | ICD-10-CM | POA: Diagnosis not present

## 2021-12-01 DIAGNOSIS — I5033 Acute on chronic diastolic (congestive) heart failure: Secondary | ICD-10-CM | POA: Diagnosis not present

## 2021-12-01 DIAGNOSIS — D62 Acute posthemorrhagic anemia: Secondary | ICD-10-CM | POA: Diagnosis not present

## 2021-12-01 DIAGNOSIS — G9341 Metabolic encephalopathy: Secondary | ICD-10-CM | POA: Diagnosis present

## 2021-12-01 DIAGNOSIS — Z95820 Peripheral vascular angioplasty status with implants and grafts: Secondary | ICD-10-CM

## 2021-12-01 DIAGNOSIS — E1159 Type 2 diabetes mellitus with other circulatory complications: Secondary | ICD-10-CM | POA: Diagnosis present

## 2021-12-01 DIAGNOSIS — R1031 Right lower quadrant pain: Secondary | ICD-10-CM | POA: Diagnosis not present

## 2021-12-01 DIAGNOSIS — F419 Anxiety disorder, unspecified: Secondary | ICD-10-CM | POA: Diagnosis present

## 2021-12-01 DIAGNOSIS — W1830XA Fall on same level, unspecified, initial encounter: Secondary | ICD-10-CM | POA: Diagnosis present

## 2021-12-01 DIAGNOSIS — I110 Hypertensive heart disease with heart failure: Secondary | ICD-10-CM | POA: Diagnosis present

## 2021-12-01 DIAGNOSIS — I97618 Postprocedural hemorrhage and hematoma of a circulatory system organ or structure following other circulatory system procedure: Secondary | ICD-10-CM | POA: Diagnosis not present

## 2021-12-01 DIAGNOSIS — R6889 Other general symptoms and signs: Secondary | ICD-10-CM | POA: Diagnosis not present

## 2021-12-01 DIAGNOSIS — Z881 Allergy status to other antibiotic agents status: Secondary | ICD-10-CM

## 2021-12-01 DIAGNOSIS — G40309 Generalized idiopathic epilepsy and epileptic syndromes, not intractable, without status epilepticus: Secondary | ICD-10-CM | POA: Diagnosis present

## 2021-12-01 DIAGNOSIS — Z91041 Radiographic dye allergy status: Secondary | ICD-10-CM

## 2021-12-01 DIAGNOSIS — E559 Vitamin D deficiency, unspecified: Secondary | ICD-10-CM | POA: Diagnosis present

## 2021-12-01 DIAGNOSIS — A419 Sepsis, unspecified organism: Secondary | ICD-10-CM | POA: Diagnosis not present

## 2021-12-01 DIAGNOSIS — Z01818 Encounter for other preprocedural examination: Secondary | ICD-10-CM

## 2021-12-01 DIAGNOSIS — R4189 Other symptoms and signs involving cognitive functions and awareness: Secondary | ICD-10-CM | POA: Diagnosis present

## 2021-12-01 DIAGNOSIS — E7800 Pure hypercholesterolemia, unspecified: Secondary | ICD-10-CM | POA: Diagnosis present

## 2021-12-01 DIAGNOSIS — R578 Other shock: Secondary | ICD-10-CM | POA: Diagnosis present

## 2021-12-01 DIAGNOSIS — T8144XA Sepsis following a procedure, initial encounter: Secondary | ICD-10-CM | POA: Diagnosis present

## 2021-12-01 DIAGNOSIS — Z515 Encounter for palliative care: Secondary | ICD-10-CM

## 2021-12-01 DIAGNOSIS — R6521 Severe sepsis with septic shock: Secondary | ICD-10-CM

## 2021-12-01 DIAGNOSIS — Y838 Other surgical procedures as the cause of abnormal reaction of the patient, or of later complication, without mention of misadventure at the time of the procedure: Secondary | ICD-10-CM | POA: Diagnosis present

## 2021-12-01 DIAGNOSIS — R4182 Altered mental status, unspecified: Secondary | ICD-10-CM | POA: Diagnosis not present

## 2021-12-01 DIAGNOSIS — Z7902 Long term (current) use of antithrombotics/antiplatelets: Secondary | ICD-10-CM

## 2021-12-01 DIAGNOSIS — K760 Fatty (change of) liver, not elsewhere classified: Secondary | ICD-10-CM | POA: Diagnosis not present

## 2021-12-01 DIAGNOSIS — Z886 Allergy status to analgesic agent status: Secondary | ICD-10-CM

## 2021-12-01 DIAGNOSIS — Z4682 Encounter for fitting and adjustment of non-vascular catheter: Secondary | ICD-10-CM | POA: Diagnosis not present

## 2021-12-01 DIAGNOSIS — Z981 Arthrodesis status: Secondary | ICD-10-CM

## 2021-12-01 DIAGNOSIS — Z9289 Personal history of other medical treatment: Secondary | ICD-10-CM

## 2021-12-01 DIAGNOSIS — R404 Transient alteration of awareness: Secondary | ICD-10-CM | POA: Diagnosis not present

## 2021-12-01 DIAGNOSIS — Z531 Procedure and treatment not carried out because of patient's decision for reasons of belief and group pressure: Secondary | ICD-10-CM | POA: Diagnosis present

## 2021-12-01 DIAGNOSIS — R739 Hyperglycemia, unspecified: Secondary | ICD-10-CM | POA: Diagnosis not present

## 2021-12-01 LAB — CBC WITH DIFFERENTIAL/PLATELET
Abs Immature Granulocytes: 0.39 10*3/uL — ABNORMAL HIGH (ref 0.00–0.07)
Basophils Absolute: 0 10*3/uL (ref 0.0–0.1)
Basophils Relative: 0 %
Eosinophils Absolute: 0 10*3/uL (ref 0.0–0.5)
Eosinophils Relative: 0 %
HCT: 26.2 % — ABNORMAL LOW (ref 36.0–46.0)
Hemoglobin: 7.9 g/dL — ABNORMAL LOW (ref 12.0–15.0)
Immature Granulocytes: 2 %
Lymphocytes Relative: 8 %
Lymphs Abs: 1.4 10*3/uL (ref 0.7–4.0)
MCH: 28 pg (ref 26.0–34.0)
MCHC: 30.2 g/dL (ref 30.0–36.0)
MCV: 92.9 fL (ref 80.0–100.0)
Monocytes Absolute: 1.1 10*3/uL — ABNORMAL HIGH (ref 0.1–1.0)
Monocytes Relative: 7 %
Neutro Abs: 13.8 10*3/uL — ABNORMAL HIGH (ref 1.7–7.7)
Neutrophils Relative %: 83 %
Platelets: 278 10*3/uL (ref 150–400)
RBC: 2.82 MIL/uL — ABNORMAL LOW (ref 3.87–5.11)
RDW: 12.8 % (ref 11.5–15.5)
WBC: 16.8 10*3/uL — ABNORMAL HIGH (ref 4.0–10.5)
nRBC: 0 % (ref 0.0–0.2)

## 2021-12-01 LAB — COMPREHENSIVE METABOLIC PANEL
ALT: 47 U/L — ABNORMAL HIGH (ref 0–44)
AST: 47 U/L — ABNORMAL HIGH (ref 15–41)
Albumin: 3 g/dL — ABNORMAL LOW (ref 3.5–5.0)
Alkaline Phosphatase: 73 U/L (ref 38–126)
Anion gap: 14 (ref 5–15)
BUN: 15 mg/dL (ref 6–20)
CO2: 16 mmol/L — ABNORMAL LOW (ref 22–32)
Calcium: 8.8 mg/dL — ABNORMAL LOW (ref 8.9–10.3)
Chloride: 108 mmol/L (ref 98–111)
Creatinine, Ser: 1.72 mg/dL — ABNORMAL HIGH (ref 0.44–1.00)
GFR, Estimated: 37 mL/min — ABNORMAL LOW (ref >60)
Glucose, Bld: 173 mg/dL — ABNORMAL HIGH (ref 70–99)
Potassium: 4.5 mmol/L (ref 3.5–5.1)
Sodium: 138 mmol/L (ref 135–145)
Total Bilirubin: 1 mg/dL (ref 0.3–1.2)
Total Protein: 5.7 g/dL — ABNORMAL LOW (ref 6.5–8.1)

## 2021-12-01 LAB — TROPONIN I (HIGH SENSITIVITY): Troponin I (High Sensitivity): 6 ng/L (ref <18)

## 2021-12-01 LAB — I-STAT BETA HCG BLOOD, ED (MC, WL, AP ONLY): I-stat hCG, quantitative: 11.7 m[IU]/mL — ABNORMAL HIGH (ref <5)

## 2021-12-01 LAB — I-STAT VENOUS BLOOD GAS, ED
Acid-base deficit: 9 mmol/L — ABNORMAL HIGH (ref 0.0–2.0)
Bicarbonate: 16.5 mmol/L — ABNORMAL LOW (ref 20.0–28.0)
Calcium, Ion: 1.13 mmol/L — ABNORMAL LOW (ref 1.15–1.40)
HCT: 27 % — ABNORMAL LOW (ref 36.0–46.0)
Hemoglobin: 9.2 g/dL — ABNORMAL LOW (ref 12.0–15.0)
O2 Saturation: 98 %
Potassium: 4.5 mmol/L (ref 3.5–5.1)
Sodium: 138 mmol/L (ref 135–145)
TCO2: 18 mmol/L — ABNORMAL LOW (ref 22–32)
pCO2, Ven: 35.1 mmHg — ABNORMAL LOW (ref 44.0–60.0)
pH, Ven: 7.28 (ref 7.250–7.430)
pO2, Ven: 127 mmHg — ABNORMAL HIGH (ref 32.0–45.0)

## 2021-12-01 LAB — BRAIN NATRIURETIC PEPTIDE: B Natriuretic Peptide: 12 pg/mL (ref 0.0–100.0)

## 2021-12-01 LAB — I-STAT ARTERIAL BLOOD GAS, ED
Acid-base deficit: 8 mmol/L — ABNORMAL HIGH (ref 0.0–2.0)
Bicarbonate: 16.5 mmol/L — ABNORMAL LOW (ref 20.0–28.0)
Calcium, Ion: 1.19 mmol/L (ref 1.15–1.40)
HCT: 16 % — ABNORMAL LOW (ref 36.0–46.0)
Hemoglobin: 5.4 g/dL — CL (ref 12.0–15.0)
O2 Saturation: 99 %
Patient temperature: 98.6
Potassium: 4.5 mmol/L (ref 3.5–5.1)
Sodium: 139 mmol/L (ref 135–145)
TCO2: 17 mmol/L — ABNORMAL LOW (ref 22–32)
pCO2 arterial: 26 mmHg — ABNORMAL LOW (ref 32.0–48.0)
pH, Arterial: 7.41 (ref 7.350–7.450)
pO2, Arterial: 148 mmHg — ABNORMAL HIGH (ref 83.0–108.0)

## 2021-12-01 LAB — I-STAT CHEM 8, ED
BUN: 15 mg/dL (ref 6–20)
Calcium, Ion: 1.12 mmol/L — ABNORMAL LOW (ref 1.15–1.40)
Chloride: 108 mmol/L (ref 98–111)
Creatinine, Ser: 1.6 mg/dL — ABNORMAL HIGH (ref 0.44–1.00)
Glucose, Bld: 169 mg/dL — ABNORMAL HIGH (ref 70–99)
HCT: 27 % — ABNORMAL LOW (ref 36.0–46.0)
Hemoglobin: 9.2 g/dL — ABNORMAL LOW (ref 12.0–15.0)
Potassium: 4.5 mmol/L (ref 3.5–5.1)
Sodium: 137 mmol/L (ref 135–145)
TCO2: 18 mmol/L — ABNORMAL LOW (ref 22–32)

## 2021-12-01 LAB — RESP PANEL BY RT-PCR (FLU A&B, COVID) ARPGX2
Influenza A by PCR: NEGATIVE
Influenza B by PCR: NEGATIVE
SARS Coronavirus 2 by RT PCR: NEGATIVE

## 2021-12-01 LAB — LIPASE, BLOOD: Lipase: 21 U/L (ref 11–51)

## 2021-12-01 LAB — GLUCOSE, CAPILLARY: Glucose-Capillary: 104 mg/dL — ABNORMAL HIGH (ref 70–99)

## 2021-12-01 LAB — LACTIC ACID, PLASMA: Lactic Acid, Venous: 6.1 mmol/L (ref 0.5–1.9)

## 2021-12-01 MED ORDER — MORPHINE SULFATE (PF) 2 MG/ML IV SOLN
1.0000 mg | INTRAVENOUS | Status: DC | PRN
  Administered 2021-12-02: 1 mg via INTRAVENOUS
  Filled 2021-12-01: qty 1

## 2021-12-01 MED ORDER — CHLORHEXIDINE GLUCONATE CLOTH 2 % EX PADS
6.0000 | MEDICATED_PAD | Freq: Every day | CUTANEOUS | Status: DC
  Administered 2021-12-01 – 2021-12-03 (×3): 6 via TOPICAL

## 2021-12-01 MED ORDER — LACTATED RINGERS IV BOLUS
1000.0000 mL | Freq: Once | INTRAVENOUS | Status: AC
  Administered 2021-12-01: 1000 mL via INTRAVENOUS

## 2021-12-01 MED ORDER — VANCOMYCIN VARIABLE DOSE PER UNSTABLE RENAL FUNCTION (PHARMACIST DOSING): Status: DC

## 2021-12-01 MED ORDER — SODIUM CHLORIDE 0.9 % IV SOLN: INTRAVENOUS | Status: DC | PRN

## 2021-12-01 MED ORDER — HYDROCORTISONE SOD SUC (PF) 250 MG IJ SOLR: 200.0000 mg | Freq: Once | INTRAMUSCULAR | Status: DC

## 2021-12-01 MED ORDER — CARBAMAZEPINE 200 MG PO TABS
300.0000 mg | ORAL_TABLET | Freq: Every day | ORAL | Status: DC
  Administered 2021-12-01: 300 mg via ORAL
  Filled 2021-12-01 (×3): qty 1.5

## 2021-12-01 MED ORDER — ONDANSETRON HCL 4 MG/2ML IJ SOLN
INTRAMUSCULAR | Status: AC
  Filled 2021-12-01: qty 2

## 2021-12-01 MED ORDER — SODIUM CHLORIDE 0.9 % IV BOLUS: 500.0000 mL | Freq: Once | INTRAVENOUS | Status: DC

## 2021-12-01 MED ORDER — SODIUM CHLORIDE 0.9 % IV BOLUS
500.0000 mL | Freq: Once | INTRAVENOUS | Status: AC
  Administered 2021-12-01: 500 mL via INTRAVENOUS

## 2021-12-01 MED ORDER — MORPHINE SULFATE (PF) 2 MG/ML IV SOLN
2.0000 mg | Freq: Once | INTRAVENOUS | Status: AC
  Administered 2021-12-01: 2 mg via INTRAVENOUS
  Filled 2021-12-01: qty 1

## 2021-12-01 MED ORDER — SODIUM CHLORIDE 0.9 % IV SOLN
2.0000 g | Freq: Three times a day (TID) | INTRAVENOUS | Status: DC
  Administered 2021-12-01 – 2021-12-02 (×2): 2 g via INTRAVENOUS
  Filled 2021-12-01 (×2): qty 2

## 2021-12-01 MED ORDER — HYDROCORTISONE SOD SUC (PF) 100 MG IJ SOLR
100.0000 mg | Freq: Once | INTRAMUSCULAR | Status: AC
  Administered 2021-12-01: 100 mg via INTRAVENOUS
  Filled 2021-12-01: qty 2

## 2021-12-01 MED ORDER — POLYETHYLENE GLYCOL 3350 17 G PO PACK: 17.0000 g | PACK | Freq: Every day | ORAL | Status: DC | PRN

## 2021-12-01 MED ORDER — SODIUM CHLORIDE 0.9 % IV SOLN
2.0000 g | Freq: Once | INTRAVENOUS | Status: AC
  Administered 2021-12-01: 2 g via INTRAVENOUS
  Filled 2021-12-01: qty 2

## 2021-12-01 MED ORDER — DOCUSATE SODIUM 100 MG PO CAPS: 100.0000 mg | ORAL_CAPSULE | Freq: Two times a day (BID) | ORAL | Status: DC | PRN

## 2021-12-01 MED ORDER — DIPHENHYDRAMINE HCL 50 MG/ML IJ SOLN: 50.0000 mg | Freq: Once | INTRAMUSCULAR | Status: DC

## 2021-12-01 MED ORDER — VENLAFAXINE HCL ER 75 MG PO CP24
75.0000 mg | ORAL_CAPSULE | Freq: Every day | ORAL | Status: DC
  Filled 2021-12-01: qty 1

## 2021-12-01 MED ORDER — VANCOMYCIN HCL 10 G IV SOLR
2500.0000 mg | Freq: Once | INTRAVENOUS | Status: AC
  Administered 2021-12-01: 2500 mg via INTRAVENOUS
  Filled 2021-12-01: qty 2500

## 2021-12-01 MED ORDER — DIPHENHYDRAMINE HCL 25 MG PO CAPS
50.0000 mg | ORAL_CAPSULE | Freq: Once | ORAL | Status: DC
  Filled 2021-12-01: qty 2

## 2021-12-01 NOTE — Procedures (Signed)
Central Venous Catheter Insertion Procedure Note  Abbigal Radich  [id number]  1976/06/21  Date:11/30/2021  Time:7:29 PM   Provider Performing:Eman Rynders R Shailey Butterbaugh   Procedure: Insertion of Non-tunneled Central Venous [phone]) with US guidance ([id number])   Indication(s) Medication administration and Difficult access  Consent Risks of the procedure as well as the alternatives and risks of each were explained to the patient and/or caregiver.  Consent for the procedure was obtained and is signed in the bedside chart  Anesthesia Topical only with 1% lidocaine   Timeout Verified patient identification, verified procedure, site/side was marked, verified correct patient position, special equipment/implants available, medications/allergies/relevant history reviewed, required imaging and test results available.  Sterile Technique Maximal sterile technique including full sterile barrier drape, hand hygiene, sterile gown, sterile gloves, mask, hair covering, sterile ultrasound probe cover (if used).  Procedure Description Area of catheter insertion was cleaned with chlorhexidine and draped in sterile fashion.  With real-time ultrasound guidance a central venous catheter was placed into the left femoral vein. Nonpulsatile blood flow and easy flushing noted in all ports.  The catheter was sutured in place and sterile dressing applied.  Complications/Tolerance None; patient tolerated the procedure well. Chest X-ray is ordered to verify placement for internal jugular or subclavian cannulation.   Chest x-ray is not ordered for femoral cannulation.  EBL Minimal  Specimen(s) None   Otilio Carpen Berna Gitto, PA-C

## 2021-12-01 NOTE — ED Notes (Signed)
Unable to get a BP. Manual was attempted and still with no luck

## 2021-12-01 NOTE — ED Notes (Signed)
EMS stated, her parents thinks she might have had a seizure and might be postical

## 2021-12-01 NOTE — ED Triage Notes (Signed)
While moving pt from stretcher to recliner pt became nauseated. Had a stent put into "vena cava" per EMS.

## 2021-12-01 NOTE — Progress Notes (Signed)
Lower extremity pseudo check has been completed.   Preliminary results in CV Proc.   Jane Edwards 12/14/2021 3:41 PM

## 2021-12-01 NOTE — ED Provider Notes (Signed)
Emergency Medicine Provider Triage Evaluation Note  Jane Edwards , a 45 y.o. female  was evaluated in triage.  Pt complains of right groin pain. She had a recent catheterization with stent on Thursday. Per EMS, patient has been going in and out. She had a CO2 reading of 14 on the way here. They have had difficulties obtaining BP readings. She has had associated n/v. There is initial concern that she could be septic given Vital sign readings. Her parents were also reportedly concerned that she could be post ictal given history of seizures.   Review of Systems  Positive: Right groin pain, fatigue, nausea, vomiting Negative:   Physical Exam  Pulse (!) 106    Temp (!) 97.5 F (36.4 C) (Oral)    Resp (!) 28    LMP  (LMP Unknown) Comment: Irregular - unsure when the last   SpO2 94%  Gen:   Lethargic initially with just mumbling when I asked her questions. On reassessment, she was perseverating on her words saying that she was tired repeatedly. She was still not following commands at this time. Resp:  Normal effort MSK:   Moves extremities without difficulty  Other:  Pupils normal. Pulses thready. Right groin with swelling and painful to the touch. No erythema or drainage from site. Patient with very pale mucous membranes.   Medical Decision Making  Medically screening exam initiated at 10:35 AM.  Appropriate orders placed.  [name] was informed that the remainder of the evaluation will be completed by another provider, this initial triage assessment does not replace that evaluation, and the importance of remaining in the ED until their evaluation is complete.  Patient needs a room to be further evaluated. Updated staff. Concern for seizure v hypovolemia v sepsis   Adolphus Birchwood, PA-C 11/25/2021 1041    Daleen Bo, MD 11/19/2021 1118

## 2021-12-01 NOTE — ED Notes (Signed)
Unable to obtain 2ns blood cultures at this time

## 2021-12-01 NOTE — Progress Notes (Signed)
Patient Care Team: Leonides Sake, MD as PCP - General (Family Medicine)  Clinic Day: 11/30/2021  Referring physician: Leonides Sake, MD  ASSESSMENT & PLAN:   Assessment & Plan: Iron deficiency anemia due to chronic blood loss Hemoglobin today is 12.2 with iron studies She most recently underwent uterine ablation in hopes of decreasing her menstrual bleeding.    The patient understands the plans discussed today and is in agreement with them.  She knows to contact our office if she develops concerns prior to her next appointment.    Melodye Ped, NP  [facility] [address] [place] [address] Dept: [phone] Dept Fax: [phone]   No orders of the defined types were placed in this encounter.     CHIEF COMPLAINT:  CC: A 45 year old female with history of iron deficiency anemia here for 3 month evaluation  Current Treatment:  Surveillance  INTERVAL HISTORY:  Jane Edwards is here today for repeat clinical assessment. She denies fevers or chills. She denies pain. Her appetite is good. Her weight has been stable.  I have reviewed the past medical history, past surgical history, social history and family history with the patient and they are unchanged from previous note.  ALLERGIES:  is allergic to gabapentin, ibuprofen, iodinated diagnostic agents, latex, dilaudid [hydromorphone], lisinopril, and rocephin [ceftriaxone].  MEDICATIONS:  Current Outpatient Medications  Medication Sig Dispense Refill   amLODipine (NORVASC) 10 MG tablet Take 10 mg by mouth daily.     bumetanide (BUMEX) 1 MG tablet Take 1 mg by mouth daily.     carbamazepine (TEGRETOL) 200 MG tablet Take 1.5 tablets (300 mg total) by mouth 2 (two) times daily. (Patient taking differently: Take 300 mg by mouth daily.) 270 tablet 3   carvedilol (COREG) 25 MG tablet Take 25 mg by mouth 2 (two) times daily with a meal.     Cholecalciferol  (D3-50) 1.25 MG (50000 UT) capsule Take 50,000 Units by mouth once a week.     clopidogrel (PLAVIX) 75 MG tablet Take 1 tablet (75 mg total) by mouth daily. 30 tablet 0   desvenlafaxine (PRISTIQ) 100 MG 24 hr tablet Take 1 tablet (100 mg total) by mouth daily. 30 tablet 2   diphenhydrAMINE (BENADRYL) 50 MG capsule Take one capsule at 0800 11/16 1 capsule 0   diphenhydrAMINE (BENADRYL) 50 MG capsule Take one capsule 1 hour prior to procedure. 1 capsule 0   enoxaparin (LOVENOX) 150 MG/ML injection Inject 0.94 mLs (140 mg total) into the skin every 12 (twelve) hours. 60 mL 0   ferrous sulfate 325 (65 FE) MG tablet Take 325 mg by mouth daily with breakfast.     furosemide (LASIX) 40 MG tablet Take 1 tablet (40 mg total) by mouth 2 (two) times daily. 60 tablet 0   magnesium oxide (MAG-OX) 400 MG tablet Take 400 mg by mouth daily.     medroxyPROGESTERone Acetate 150 MG/ML SUSY Inject 1 mL into the muscle every 3 (three) months.     predniSONE & diphenhydrAMINE (CONTRAST ALLERGY PREMED PACK) 3 x 50 MG & 1 x 50 MG KIT Take 50 mg by mouth as directed. (Patient not taking: Reported on 11/17/2021) 1 kit 0   promethazine (PHENERGAN) 12.5 MG tablet Take 1 tablet (12.5 mg total) by mouth every 4 (four) hours as needed for nausea or vomiting. 15 tablet 0   spironolactone (ALDACTONE) 50 MG tablet Take 50 mg by mouth daily.  topiramate ER (QUDEXY XR) 150 MG CS24 sprinkle capsule Take 2 capsules by mouth every day 60 capsule 11   traZODone (DESYREL) 100 MG tablet Take 0.5-1 tablets (50-100 mg total) by mouth at bedtime. (Patient taking differently: Take 50 mg by mouth at bedtime.) 30 tablet 0   No current facility-administered medications for this visit.    HISTORY OF PRESENT ILLNESS:   Oncology History  Iron deficiency anemia due to chronic blood loss      REVIEW OF SYSTEMS:   Constitutional: Denies fevers, chills or abnormal weight loss Eyes: Denies blurriness of vision Ears, nose, mouth, throat,  and face: Denies mucositis or sore throat Respiratory: Denies cough, dyspnea or wheezes Cardiovascular: Denies palpitation, chest discomfort or lower extremity swelling Gastrointestinal:  Denies nausea, heartburn or change in bowel habits Skin: Denies abnormal skin rashes Lymphatics: Denies new lymphadenopathy or easy bruising Neurological:Denies numbness, tingling or new weaknesses Behavioral/Psych: Mood is stable, no new changes  All other systems were reviewed with the patient and are negative.   VITALS:  Blood pressure (!) 171/83, pulse 83, temperature 98.7 F (37.1 C), resp. rate 16, height '5\' 1"'  (1.549 m), weight (!) 303 lb 14.4 oz (137.8 kg), SpO2 100 %.  Wt Readings from Last 3 Encounters:  11/30/21 (!) 303 lb 14.4 oz (137.8 kg)  11/23/21 (!) 306 lb (138.8 kg)  11/04/21 (!) 325 lb (147.4 kg)    Body mass index is 57.42 kg/m.  Performance status (ECOG): 1 - Symptomatic but completely ambulatory  PHYSICAL EXAM:   GENERAL:alert, no distress and comfortable SKIN: skin color, texture, turgor are normal, no rashes or significant lesions EYES: normal, Conjunctiva are pink and non-injected, sclera clear OROPHARYNX:no exudate, no erythema and lips, buccal mucosa, and tongue normal  NECK: supple, thyroid normal size, non-tender, without nodularity LYMPH:  no palpable lymphadenopathy in the cervical, axillary or inguinal LUNGS: clear to auscultation and percussion with normal breathing effort HEART: regular rate & rhythm and no murmurs and no lower extremity edema ABDOMEN:abdomen soft, non-tender and normal bowel sounds Musculoskeletal:no cyanosis of digits and no clubbing  NEURO: alert & oriented x 3 with fluent speech, no focal motor/sensory deficits  LABORATORY DATA:  I have reviewed the data as listed    Component Value Date/Time   NA 134 (L) 11/24/2021 0631   NA 138 07/31/2021 0000   K 4.0 11/24/2021 0631   CL 103 11/24/2021 0631   CO2 21 (L) 11/24/2021 0631   GLUCOSE  126 (H) 11/24/2021 0631   BUN 9 11/24/2021 0631   BUN 9 07/31/2021 0000   CREATININE 0.97 11/24/2021 0631   CALCIUM 8.7 (L) 11/24/2021 0631   PROT 6.7 09/03/2021 1546   PROT 7.0 01/14/2021 1150   ALBUMIN 3.6 09/03/2021 1546   ALBUMIN 4.1 01/14/2021 1150   AST 76 (H) 09/03/2021 1546   ALT 69 (H) 09/03/2021 1546   ALKPHOS 93 09/03/2021 1546   BILITOT 0.6 09/03/2021 1546   BILITOT 0.3 01/14/2021 1150   GFRNONAA >60 11/24/2021 0631   GFRAA 96 01/14/2021 1150    No results found for: SPEP, UPEP  Lab Results  Component Value Date   WBC 5.6 11/30/2021   NEUTROABS 4.20 11/30/2021   HGB 12.2 11/30/2021   HCT 37 11/30/2021   MCV 86.5 11/24/2021   PLT 161 11/30/2021      Chemistry      Component Value Date/Time   NA 134 (L) 11/24/2021 0631   NA 138 07/31/2021 0000   K 4.0 11/24/2021 0631  CL 103 11/24/2021 0631   CO2 21 (L) 11/24/2021 0631   BUN 9 11/24/2021 0631   BUN 9 07/31/2021 0000   CREATININE 0.97 11/24/2021 0631   GLU 95 07/31/2021 0000      Component Value Date/Time   CALCIUM 8.7 (L) 11/24/2021 0631   ALKPHOS 93 09/03/2021 1546   AST 76 (H) 09/03/2021 1546   ALT 69 (H) 09/03/2021 1546   BILITOT 0.6 09/03/2021 1546   BILITOT 0.3 01/14/2021 1150       RADIOGRAPHIC STUDIES: I have personally reviewed the radiological images as listed and agreed with the findings in the report. IR Angiogram Pelvis Selective Or Supraselective  Result Date: 11/04/2021 INDICATION: 45 year old female with history of uterine fibroids and menorrhagia resulting and chronic iron deficiency anemia requiring iron transfusions. EXAM: 1. Ultrasound-guided vascular access of the left radial artery. 2. Pelvic angiogram. 3. Selective catheterization of bilateral uterine arteries. 4. Particle embolization of the bilateral uterine arteries. MEDICATIONS: Vancomycin 1.5 IV. The antibiotic was administered within 1 hour of the procedure. 8 mg Zofran, intravenous. 1 g intravenous Tylenol.  ANESTHESIA/SEDATION: Moderate (conscious) sedation was employed during this procedure. A total of Versed 0.5 mg and Fentanyl 50 mcg was administered intravenously. Moderate Sedation Time: 82 minutes. The patient's level of consciousness and vital signs were monitored continuously by radiology nursing throughout the procedure under my direct supervision. CONTRAST:  88m OMNIPAQUE IOHEXOL 300 MG/ML SOLN, 580mOMNIPAQUE IOHEXOL 300 MG/ML SOLN FLUOROSCOPY TIME:  Fluoroscopy Time: 20 minutes 42 seconds (1,812 mGy). COMPLICATIONS: None immediate. PROCEDURE: Informed consent was obtained from the patient following explanation of the procedure, risks, benefits and alternatives. The patient understands, agrees and consents for the procedure. All questions were addressed. A time out was performed prior to the initiation of the procedure. Maximal barrier sterile technique utilized including caps, mask, sterile gowns, sterile gloves, large sterile drape, hand hygiene, and Betadine prep. The left wrist was prepped and draped in standard fashion. BaNickolas Madridest was "A". Subdermal Local anesthesia was provided at the planned needle entry site. Under direct ultrasound visualization, the left radial artery was punctured with a 4 cm micropuncture needle. A permanent image was captured and stored. A micropuncture wire was inserted without difficulty. Additional local anesthetic was administered and a small skin nick was made. The needle was removed and a 4/5 French slender sheath was inserted. A radial cocktail was slowly administered. Through the sheath, a 5 French MG2 Glide catheter was inserted over a Bentson wire under direct fluoroscopic guidance from the left upper extremity to the abdominal aorta. The right common and internal iliac arteries were selected. Right internal iliac angiogram was then performed which demonstrated a patent uterine artery arising from the anterior division. A Renegade high flow and 0.016 fathom wire were  used to select the right uterine artery. The horizontal portion of the uterine artery was catheterized and angiogram was performed. The right hemi uterus was visualized with at least 1 hyperenhancing mass compatible with a uterine fibroid. There was no evidence of vesicular, rectal, or vaginal branches. Embolization was then performed with 500-700 micron embospheres. After injection of approximately 1 vial, there was noted to be sluggish flow in the uterine artery. 3 mL of preservative-free lidocaine were then injected intra-arterially. The microcatheter was then removed. The 5 French catheter was then retracted and used to select the left common and internal iliac artery. Left internal iliac angiogram demonstrated a patent uterine artery arising from the anterior division. A Renegade high flow and 0.016 fathom  wire were used to select the left uterine artery. The horizontal portion of the uterine artery was catheterized and angiogram was performed. The left hemi uterus was visualized with at least 1 hyperenhancing mass compatible with a uterine fibroid. There was no evidence of vesicular, rectal, or vaginal branches. Embolization was then performed with 500-700 micron embospheres. After injection of approximately 1 vial, there was noted to be sluggish flow in the uterine artery. 3 mL of preservative-free lidocaine were then injected intra-arterially. The microcatheter was then removed. The 5 French catheter was then retracted to the abdominal aorta. Pelvic angiogram was then performed which demonstrated widely patent bilateral inflow vessels with relative pruning of the previously visualized bilateral uterine arteries. The catheter was then removed. A TR band was applied to the left radial sheath which was then removed without complication. The patient tolerated procedure well was transferred to the post procedural area in good condition. IMPRESSION: Technically successful bilateral uterine artery embolization.  Ruthann Cancer, MD [facility] Radiology Electronically Signed   By: Ruthann Cancer M.D.   On: 11/04/2021 14:53   IR Angiogram Pelvis Selective Or Supraselective  Result Date: 11/04/2021 INDICATION: 45 year old female with history of uterine fibroids and menorrhagia resulting and chronic iron deficiency anemia requiring iron transfusions. EXAM: 1. Ultrasound-guided vascular access of the left radial artery. 2. Pelvic angiogram. 3. Selective catheterization of bilateral uterine arteries. 4. Particle embolization of the bilateral uterine arteries. MEDICATIONS: Vancomycin 1.5 IV. The antibiotic was administered within 1 hour of the procedure. 8 mg Zofran, intravenous. 1 g intravenous Tylenol. ANESTHESIA/SEDATION: Moderate (conscious) sedation was employed during this procedure. A total of Versed 0.5 mg and Fentanyl 50 mcg was administered intravenously. Moderate Sedation Time: 82 minutes. The patient's level of consciousness and vital signs were monitored continuously by radiology nursing throughout the procedure under my direct supervision. CONTRAST:  41m OMNIPAQUE IOHEXOL 300 MG/ML SOLN, 548mOMNIPAQUE IOHEXOL 300 MG/ML SOLN FLUOROSCOPY TIME:  Fluoroscopy Time: 20 minutes 42 seconds (1,812 mGy). COMPLICATIONS: None immediate. PROCEDURE: Informed consent was obtained from the patient following explanation of the procedure, risks, benefits and alternatives. The patient understands, agrees and consents for the procedure. All questions were addressed. A time out was performed prior to the initiation of the procedure. Maximal barrier sterile technique utilized including caps, mask, sterile gowns, sterile gloves, large sterile drape, hand hygiene, and Betadine prep. The left wrist was prepped and draped in standard fashion. BaNickolas Madridest was "A". Subdermal Local anesthesia was provided at the planned needle entry site. Under direct ultrasound visualization, the left radial  artery was punctured with a 4 cm micropuncture needle. A permanent image was captured and stored. A micropuncture wire was inserted without difficulty. Additional local anesthetic was administered and a small skin nick was made. The needle was removed and a 4/5 French slender sheath was inserted. A radial cocktail was slowly administered. Through the sheath, a 5 French MG2 Glide catheter was inserted over a Bentson wire under direct fluoroscopic guidance from the left upper extremity to the abdominal aorta. The right common and internal iliac arteries were selected. Right internal iliac angiogram was then performed which demonstrated a patent uterine artery arising from the anterior division. A Renegade high flow and 0.016 fathom wire were used to select the right uterine artery. The horizontal portion of the uterine artery was catheterized and angiogram was performed. The right hemi uterus was visualized with at least 1 hyperenhancing mass compatible with a uterine fibroid. There was no evidence of  vesicular, rectal, or vaginal branches. Embolization was then performed with 500-700 micron embospheres. After injection of approximately 1 vial, there was noted to be sluggish flow in the uterine artery. 3 mL of preservative-free lidocaine were then injected intra-arterially. The microcatheter was then removed. The 5 French catheter was then retracted and used to select the left common and internal iliac artery. Left internal iliac angiogram demonstrated a patent uterine artery arising from the anterior division. A Renegade high flow and 0.016 fathom wire were used to select the left uterine artery. The horizontal portion of the uterine artery was catheterized and angiogram was performed. The left hemi uterus was visualized with at least 1 hyperenhancing mass compatible with a uterine fibroid. There was no evidence of vesicular, rectal, or vaginal branches. Embolization was then performed with 500-700 micron  embospheres. After injection of approximately 1 vial, there was noted to be sluggish flow in the uterine artery. 3 mL of preservative-free lidocaine were then injected intra-arterially. The microcatheter was then removed. The 5 French catheter was then retracted to the abdominal aorta. Pelvic angiogram was then performed which demonstrated widely patent bilateral inflow vessels with relative pruning of the previously visualized bilateral uterine arteries. The catheter was then removed. A TR band was applied to the left radial sheath which was then removed without complication. The patient tolerated procedure well was transferred to the post procedural area in good condition. IMPRESSION: Technically successful bilateral uterine artery embolization. Ruthann Cancer, MD [facility] Radiology Electronically Signed   By: Ruthann Cancer M.D.   On: 11/04/2021 14:53   IR Angiogram Selective Each Additional Vessel  Result Date: 11/04/2021 INDICATION: 45 year old female with history of uterine fibroids and menorrhagia resulting and chronic iron deficiency anemia requiring iron transfusions. EXAM: 1. Ultrasound-guided vascular access of the left radial artery. 2. Pelvic angiogram. 3. Selective catheterization of bilateral uterine arteries. 4. Particle embolization of the bilateral uterine arteries. MEDICATIONS: Vancomycin 1.5 IV. The antibiotic was administered within 1 hour of the procedure. 8 mg Zofran, intravenous. 1 g intravenous Tylenol. ANESTHESIA/SEDATION: Moderate (conscious) sedation was employed during this procedure. A total of Versed 0.5 mg and Fentanyl 50 mcg was administered intravenously. Moderate Sedation Time: 82 minutes. The patient's level of consciousness and vital signs were monitored continuously by radiology nursing throughout the procedure under my direct supervision. CONTRAST:  52m OMNIPAQUE IOHEXOL 300 MG/ML SOLN, 561mOMNIPAQUE IOHEXOL 300 MG/ML SOLN  FLUOROSCOPY TIME:  Fluoroscopy Time: 20 minutes 42 seconds (1,812 mGy). COMPLICATIONS: None immediate. PROCEDURE: Informed consent was obtained from the patient following explanation of the procedure, risks, benefits and alternatives. The patient understands, agrees and consents for the procedure. All questions were addressed. A time out was performed prior to the initiation of the procedure. Maximal barrier sterile technique utilized including caps, mask, sterile gowns, sterile gloves, large sterile drape, hand hygiene, and Betadine prep. The left wrist was prepped and draped in standard fashion. BaNickolas Madridest was "A". Subdermal Local anesthesia was provided at the planned needle entry site. Under direct ultrasound visualization, the left radial artery was punctured with a 4 cm micropuncture needle. A permanent image was captured and stored. A micropuncture wire was inserted without difficulty. Additional local anesthetic was administered and a small skin nick was made. The needle was removed and a 4/5 French slender sheath was inserted. A radial cocktail was slowly administered. Through the sheath, a 5 French MG2 Glide catheter was inserted over a Bentson wire under direct fluoroscopic guidance from the left upper  extremity to the abdominal aorta. The right common and internal iliac arteries were selected. Right internal iliac angiogram was then performed which demonstrated a patent uterine artery arising from the anterior division. A Renegade high flow and 0.016 fathom wire were used to select the right uterine artery. The horizontal portion of the uterine artery was catheterized and angiogram was performed. The right hemi uterus was visualized with at least 1 hyperenhancing mass compatible with a uterine fibroid. There was no evidence of vesicular, rectal, or vaginal branches. Embolization was then performed with 500-700 micron embospheres. After injection of approximately 1 vial, there was noted to be sluggish  flow in the uterine artery. 3 mL of preservative-free lidocaine were then injected intra-arterially. The microcatheter was then removed. The 5 French catheter was then retracted and used to select the left common and internal iliac artery. Left internal iliac angiogram demonstrated a patent uterine artery arising from the anterior division. A Renegade high flow and 0.016 fathom wire were used to select the left uterine artery. The horizontal portion of the uterine artery was catheterized and angiogram was performed. The left hemi uterus was visualized with at least 1 hyperenhancing mass compatible with a uterine fibroid. There was no evidence of vesicular, rectal, or vaginal branches. Embolization was then performed with 500-700 micron embospheres. After injection of approximately 1 vial, there was noted to be sluggish flow in the uterine artery. 3 mL of preservative-free lidocaine were then injected intra-arterially. The microcatheter was then removed. The 5 French catheter was then retracted to the abdominal aorta. Pelvic angiogram was then performed which demonstrated widely patent bilateral inflow vessels with relative pruning of the previously visualized bilateral uterine arteries. The catheter was then removed. A TR band was applied to the left radial sheath which was then removed without complication. The patient tolerated procedure well was transferred to the post procedural area in good condition. IMPRESSION: Technically successful bilateral uterine artery embolization. Ruthann Cancer, MD [facility] Radiology Electronically Signed   By: Ruthann Cancer M.D.   On: 11/04/2021 14:53   IR Angiogram Selective Each Additional Vessel  Result Date: 11/04/2021 INDICATION: 45 year old female with history of uterine fibroids and menorrhagia resulting and chronic iron deficiency anemia requiring iron transfusions. EXAM: 1. Ultrasound-guided vascular access of the left  radial artery. 2. Pelvic angiogram. 3. Selective catheterization of bilateral uterine arteries. 4. Particle embolization of the bilateral uterine arteries. MEDICATIONS: Vancomycin 1.5 IV. The antibiotic was administered within 1 hour of the procedure. 8 mg Zofran, intravenous. 1 g intravenous Tylenol. ANESTHESIA/SEDATION: Moderate (conscious) sedation was employed during this procedure. A total of Versed 0.5 mg and Fentanyl 50 mcg was administered intravenously. Moderate Sedation Time: 82 minutes. The patient's level of consciousness and vital signs were monitored continuously by radiology nursing throughout the procedure under my direct supervision. CONTRAST:  78m OMNIPAQUE IOHEXOL 300 MG/ML SOLN, 552mOMNIPAQUE IOHEXOL 300 MG/ML SOLN FLUOROSCOPY TIME:  Fluoroscopy Time: 20 minutes 42 seconds (1,812 mGy). COMPLICATIONS: None immediate. PROCEDURE: Informed consent was obtained from the patient following explanation of the procedure, risks, benefits and alternatives. The patient understands, agrees and consents for the procedure. All questions were addressed. A time out was performed prior to the initiation of the procedure. Maximal barrier sterile technique utilized including caps, mask, sterile gowns, sterile gloves, large sterile drape, hand hygiene, and Betadine prep. The left wrist was prepped and draped in standard fashion. BaNickolas Madridest was "A". Subdermal Local anesthesia was provided at the planned needle entry site.  Under direct ultrasound visualization, the left radial artery was punctured with a 4 cm micropuncture needle. A permanent image was captured and stored. A micropuncture wire was inserted without difficulty. Additional local anesthetic was administered and a small skin nick was made. The needle was removed and a 4/5 French slender sheath was inserted. A radial cocktail was slowly administered. Through the sheath, a 5 French MG2 Glide catheter was inserted over a Bentson wire under direct  fluoroscopic guidance from the left upper extremity to the abdominal aorta. The right common and internal iliac arteries were selected. Right internal iliac angiogram was then performed which demonstrated a patent uterine artery arising from the anterior division. A Renegade high flow and 0.016 fathom wire were used to select the right uterine artery. The horizontal portion of the uterine artery was catheterized and angiogram was performed. The right hemi uterus was visualized with at least 1 hyperenhancing mass compatible with a uterine fibroid. There was no evidence of vesicular, rectal, or vaginal branches. Embolization was then performed with 500-700 micron embospheres. After injection of approximately 1 vial, there was noted to be sluggish flow in the uterine artery. 3 mL of preservative-free lidocaine were then injected intra-arterially. The microcatheter was then removed. The 5 French catheter was then retracted and used to select the left common and internal iliac artery. Left internal iliac angiogram demonstrated a patent uterine artery arising from the anterior division. A Renegade high flow and 0.016 fathom wire were used to select the left uterine artery. The horizontal portion of the uterine artery was catheterized and angiogram was performed. The left hemi uterus was visualized with at least 1 hyperenhancing mass compatible with a uterine fibroid. There was no evidence of vesicular, rectal, or vaginal branches. Embolization was then performed with 500-700 micron embospheres. After injection of approximately 1 vial, there was noted to be sluggish flow in the uterine artery. 3 mL of preservative-free lidocaine were then injected intra-arterially. The microcatheter was then removed. The 5 French catheter was then retracted to the abdominal aorta. Pelvic angiogram was then performed which demonstrated widely patent bilateral inflow vessels with relative pruning of the previously visualized bilateral  uterine arteries. The catheter was then removed. A TR band was applied to the left radial sheath which was then removed without complication. The patient tolerated procedure well was transferred to the post procedural area in good condition. IMPRESSION: Technically successful bilateral uterine artery embolization. Ruthann Cancer, MD [facility] Radiology Electronically Signed   By: Ruthann Cancer M.D.   On: 11/04/2021 14:53   IR US Guide Vasc Access Left  Result Date: 11/04/2021 INDICATION: 45 year old female with history of uterine fibroids and menorrhagia resulting and chronic iron deficiency anemia requiring iron transfusions. EXAM: 1. Ultrasound-guided vascular access of the left radial artery. 2. Pelvic angiogram. 3. Selective catheterization of bilateral uterine arteries. 4. Particle embolization of the bilateral uterine arteries. MEDICATIONS: Vancomycin 1.5 IV. The antibiotic was administered within 1 hour of the procedure. 8 mg Zofran, intravenous. 1 g intravenous Tylenol. ANESTHESIA/SEDATION: Moderate (conscious) sedation was employed during this procedure. A total of Versed 0.5 mg and Fentanyl 50 mcg was administered intravenously. Moderate Sedation Time: 82 minutes. The patient's level of consciousness and vital signs were monitored continuously by radiology nursing throughout the procedure under my direct supervision. CONTRAST:  54m OMNIPAQUE IOHEXOL 300 MG/ML SOLN, 581mOMNIPAQUE IOHEXOL 300 MG/ML SOLN FLUOROSCOPY TIME:  Fluoroscopy Time: 20 minutes 42 seconds (1,812 mGy). COMPLICATIONS: None immediate. PROCEDURE: Informed consent was  obtained from the patient following explanation of the procedure, risks, benefits and alternatives. The patient understands, agrees and consents for the procedure. All questions were addressed. A time out was performed prior to the initiation of the procedure. Maximal barrier sterile technique utilized including caps,  mask, sterile gowns, sterile gloves, large sterile drape, hand hygiene, and Betadine prep. The left wrist was prepped and draped in standard fashion. Nickolas [place] test was "A". Subdermal Local anesthesia was provided at the planned needle entry site. Under direct ultrasound visualization, the left radial artery was punctured with a 4 cm micropuncture needle. A permanent image was captured and stored. A micropuncture wire was inserted without difficulty. Additional local anesthetic was administered and a small skin nick was made. The needle was removed and a 4/5 French slender sheath was inserted. A radial cocktail was slowly administered. Through the sheath, a 5 French MG2 Glide catheter was inserted over a Bentson wire under direct fluoroscopic guidance from the left upper extremity to the abdominal aorta. The right common and internal iliac arteries were selected. Right internal iliac angiogram was then performed which demonstrated a patent uterine artery arising from the anterior division. A Renegade high flow and 0.016 fathom wire were used to select the right uterine artery. The horizontal portion of the uterine artery was catheterized and angiogram was performed. The right hemi uterus was visualized with at least 1 hyperenhancing mass compatible with a uterine fibroid. There was no evidence of vesicular, rectal, or vaginal branches. Embolization was then performed with 500-700 micron embospheres. After injection of approximately 1 vial, there was noted to be sluggish flow in the uterine artery. 3 mL of preservative-free lidocaine were then injected intra-arterially. The microcatheter was then removed. The 5 French catheter was then retracted and used to select the left common and internal iliac artery. Left internal iliac angiogram demonstrated a patent uterine artery arising from the anterior division. A Renegade high flow and 0.016 fathom wire were used to select the left uterine artery. The horizontal portion of  the uterine artery was catheterized and angiogram was performed. The left hemi uterus was visualized with at least 1 hyperenhancing mass compatible with a uterine fibroid. There was no evidence of vesicular, rectal, or vaginal branches. Embolization was then performed with 500-700 micron embospheres. After injection of approximately 1 vial, there was noted to be sluggish flow in the uterine artery. 3 mL of preservative-free lidocaine were then injected intra-arterially. The microcatheter was then removed. The 5 French catheter was then retracted to the abdominal aorta. Pelvic angiogram was then performed which demonstrated widely patent bilateral inflow vessels with relative pruning of the previously visualized bilateral uterine arteries. The catheter was then removed. A TR band was applied to the left radial sheath which was then removed without complication. The patient tolerated procedure well was transferred to the post procedural area in good condition. IMPRESSION: Technically successful bilateral uterine artery embolization. Ruthann Cancer, MD [facility] Radiology Electronically Signed   By: Ruthann Cancer M.D.   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 Status post unsuccessful catheter and wire central venous recanalization on 09/08/2021. She presents today for sharp recanalization due to persistent SVC syndrome symptoms. EXAM: 1. Ultrasound-guided vascular access  of the right common femoral vein, right brachial vein, and right internal jugular vein. 2. Right upper extremity and central venography. 3. Sharp recanalization of the right innominate  vein. 4. Balloon angioplasty of the right axillary vein, subclavian vein, right innominate vein, and superior vena cava. 5. Right subclavian vein to superior vena cava stent placement. 6. Intravascular ultrasound. COMPARISON:  08/21/2021, 09/08/2021 MEDICATIONS: None. ANESTHESIA/SEDATION: The procedure was performed under general anesthesia. FLUOROSCOPY TIME:  Fluoroscopy Time: 58 minutes 16 seconds (1,881 mGy). CONTRAST:  150 mL Omnipaque 300, intravenous COMPLICATIONS: None immediate. TECHNIQUE: Informed written consent was obtained from the patient after a thorough discussion of the procedural risks, benefits and alternatives. All questions were addressed. Maximal Sterile Barrier Technique was utilized including caps, mask, sterile gowns, sterile gloves, sterile drape, hand hygiene and skin antiseptic. A timeout was performed prior to the initiation of the procedure. The right groin, right upper extremity, and right neck were prepped and draped in standard fashion. The right upper extremity was then examined which demonstrated a patent right brachial vein. A small skin nick was made at the planned needle entry site. Under direct ultrasound visualization, a 21 gauge micropuncture needle was directed into the right brachial vein. An image was captured and stored in a permanent record. A Micropuncture sheath was placed. A Wholey wire was directed to the level of the subclavian vein. The micropuncture sheath was exchanged for a 6 French, 35 cm vascular sheath. A 5 [place] Kumpe the catheter was inserted to gain more central access in the subclavian vein. Limited right upper extremity venogram demonstrated irregular appearance of the central right subclavian vein with stenotic but patent communication with the central right internal jugular vein. There is no antegrade flow through the innominate vein. There are multiple cervical and right chest wall collateral veins present. Ultrasound evaluation demonstrated patency  of the right common femoral vein. A small skin nick was made at the planned needle entry site. Under direct ultrasound visualization, a 21 gauge micropuncture needle was directed into the right common femoral vein. An image was captured and stored the permanent record. A micropuncture sheath was placed. A Wholey wire was then directed to the inferior vena cava under fluoroscopic guidance and the micro puncture sheath was exchanged for a 12 French, 35 cm vascular sheath. Subsequently, in coaxial fashion, a 7 [place], 65 cm sheath was introduced over the wire into the superior vena cava. Simultaneous right upper extremity and central venogram was performed which demonstrated complete occlusion of the right innominate vein extending into the peripheral superior vena cava. The mid to central superior vena cava and azygous veins are patent. Given the patency of the internal jugular vein, right internal jugular vein access was not pursued. Ultrasound evaluation demonstrated patency of the right internal jugular vein. A small skin nick was made at the planned needle entry site. Under direct ultrasound visualization, a 21 gauge micropuncture needle was directed into the right internal jugular vein. An image was captured and stored in the permanent record. A micropuncture sheath was placed. A Wholey wire was then directed into the internal jugular vein under fluoroscopic guidance and a micropuncture sheath was exchanged for a 6 [place], 10 cm vascular sheath. A 5 French angled tip catheter was then directed to the central aspect of the right internal jugular vein at the confluence with the subclavian vein, just above the occluded innominate vein. Venogram was performed which demonstrated multiple cervical and chest wall collaterals with several patent collaterals with the subclavian vein.  From the inferior approach, an angled tip 5 French catheter was directed to the apex of the occluded superior vena cava. A stiff Glidewire  was then used to attempt to catheter and wire recanalization of the innominate vein which was unsuccessful. Therefore, from the right arm approach, a 7 mm x 20 mm mustang balloon was inserted and positioned at the level of the jugular and subclavian confluence and inflated. From the right groin, in 89 cm BRK needle was advanced over fluoroscopic guidance with multiple live fluoro obliquities to target the balloon. Given challenge targeting the 7 mm balloon. This was exchanged for a 10 mm by 4 cm mustang balloon which was inflated in similar position. The BRK needle was advanced and tented the medial aspect of the balloon with inability to rupture/enter. Therefore, the inner stylet of the [place] needle was removed and a V18 wire was inserted which formed around the peripheral aspect of the balloon. The balloon was deflated and removed. Through the right arm access, a 15 mm loop snare was then inserted and the V18 wire was captured in the right internal jugular vein. The wire was then pulled through the right arm sheath the great through and through, flossing access. A quick cross 135 cm catheter was then inserted from the right groin sheath over the wire, exiting the right arm sheath. The wire was removed and exchanged for 0.014 inch Spartacore wire. From the groin approach, a 4 mm x 150 mm coyote balloon was inserted and balloon angioplasty was performed about the recanalized right innominate vein. There was a high-grade focal stenosis just inferior to the right clavicle. Venogram was performed from the right arm sheath after removal of the balloon which demonstrated occlusion of the right axillary and right subclavian veins. A 125 cm, 5 French angled tip catheter was then inserted over the wire from the right groin approach exiting right upper extremity sheath. A 0.014 inch wire was removed and exchanged for a 0.035 inch stiff exchange Glidewire. Next, balloon angioplasty was performed with a 10 mm x 4 cm mustang  balloon in multiple stations from the level of the innominate vein through the right axillary vein. Intravascular ultrasound was then performed through the right heart and into the right axillary vein. 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 Sizing in length was demarcated with fluoroscopic reference using the IVUS catheter. Next, a 14 mm x 80 mm Abre self expanding stent was deployed under fluoroscopic guidance from the right subclavian vein through the superior aspect of the superior vena cava. Post balloon molding was performed with a 14 mm x 4 cm atlas balloon. Venogram demonstrated persistent multifocal occlusion/high-grade stenosis in the right axillary and peripheral right subclavian vein. There are multiple filling defects within the otherwise patent stent. Additional balloon angioplasty was performed of the right axillary and right subclavian veins as well as through the indwelling stent. Right upper extremity venogram demonstrated improved patency inflow with multiple filling defects remaining throughout the length of the stent, particularly at the level of the first rib/clavicle. Therefore, additional post balloon molding with a 14 mm x 4 cm atlas balloon was performed. Completion venogram from the right upper extremity demonstrated brisk antegrade flow through the right subclavian vein, indwelling stent, and into the superior vena cava. There are persistent moderate filling defects about the level of first rib/clavicle,  which were recalcitrant to aggressive balloon molding. Intravascular ultrasound was repeated throughout the length of the stent and right subclavian vein which demonstrated significantly improved patency in excellent proximal distal wall apposition with multiple intraluminal echogenic  irregularities through the midportion of the stent. Attempt at venogram from the right IJ was a made, however the indwelling sheath and catheter had backed out of the intraluminal position and were positioned in the right neck soft tissues. This sheath, along with right upper extremity right groin sheaths were removed and hemostasis was achieved with manual compression. Sterile bandages were applied. The patient was administered 1 milligram/kilogram Lovenox injection prior to leaving the IR suite. The patient was extubated and transferred to the postanesthesia care unit in stable condition. IMPRESSION: 1. 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 2. Technically successful sharp recanalization of the right innominate vein. 3. Right subclavian to superior vena cava self expanding stent placement (14 x 80 mm Abre). PLAN: After extensive conversation with Pharmacy regarding Ms. Yerian' drug drug interactions, decision was made to initiate therapeutic Lovenox for 1 month and indefinite Plavix. Plan for follow-up CT chest in 1 month with concomitant clinic appointment. Ruthann Cancer, MD [facility] Radiology Electronically Signed   By: Ruthann Cancer M.D.   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 Status post unsuccessful catheter and wire central venous recanalization on 09/08/2021. She presents today for sharp recanalization due to persistent SVC syndrome symptoms. EXAM: 1. Ultrasound-guided vascular access of the right common femoral vein, right brachial vein, and right internal jugular vein. 2. Right upper extremity and central venography. 3. Sharp  recanalization of the right innominate vein. 4. Balloon angioplasty of the right axillary vein, subclavian vein, right innominate vein, and superior vena cava. 5. Right subclavian vein to superior vena cava stent placement. 6. Intravascular ultrasound. COMPARISON:  08/21/2021, 09/08/2021 MEDICATIONS: None. ANESTHESIA/SEDATION: The procedure was performed under general anesthesia. FLUOROSCOPY TIME:  Fluoroscopy Time: 58 minutes 16 seconds (1,881 mGy). CONTRAST:  150 mL Omnipaque 300, intravenous COMPLICATIONS: None immediate. TECHNIQUE: Informed written consent was obtained from the patient after a thorough discussion of the procedural risks, benefits and alternatives. All questions were addressed. Maximal Sterile Barrier Technique was utilized including caps, mask, sterile gowns, sterile gloves, sterile drape, hand hygiene and skin antiseptic. A timeout was performed prior to the initiation of the procedure. The right groin, right upper extremity, and right neck were prepped and draped in standard fashion. The right upper extremity was then examined which demonstrated a patent right brachial vein. A small skin nick was made at the planned needle entry site. Under direct ultrasound visualization, a 21 gauge micropuncture needle was directed into the right brachial vein. An image was captured and stored in a permanent record. A Micropuncture sheath was placed. A Wholey wire was directed to the level of the subclavian vein. The micropuncture sheath was exchanged for a 6 French, 35 cm vascular sheath. A 5 [place] Kumpe the catheter was inserted to gain more central access in the subclavian vein. Limited right upper extremity venogram demonstrated irregular appearance of the central right subclavian vein with stenotic but patent communication with the central right internal jugular vein. There is no antegrade flow through the innominate  vein. There are multiple cervical and right chest wall collateral veins present.  Ultrasound evaluation demonstrated patency of the right common femoral vein. A small skin nick was made at the planned needle entry site. Under direct ultrasound visualization, a 21 gauge micropuncture needle was directed into the right common femoral vein. An image was captured and stored the permanent record. A micropuncture sheath was placed. A Wholey wire was then directed to the inferior vena cava under fluoroscopic guidance and the micro puncture sheath was exchanged for a 12 French, 35 cm vascular sheath. Subsequently, in coaxial fashion, a 7 [place], 65 cm sheath was introduced over the wire into the superior vena cava. Simultaneous right upper extremity and central venogram was performed which demonstrated complete occlusion of the right innominate vein extending into the peripheral superior vena cava. The mid to central superior vena cava and azygous veins are patent. Given the patency of the internal jugular vein, right internal jugular vein access was not pursued. Ultrasound evaluation demonstrated patency of the right internal jugular vein. A small skin nick was made at the planned needle entry site. Under direct ultrasound visualization, a 21 gauge micropuncture needle was directed into the right internal jugular vein. An image was captured and stored in the permanent record. A micropuncture sheath was placed. A Wholey wire was then directed into the internal jugular vein under fluoroscopic guidance and a micropuncture sheath was exchanged for a 6 [place], 10 cm vascular sheath. A 5 French angled tip catheter was then directed to the central aspect of the right internal jugular vein at the confluence with the subclavian vein, just above the occluded innominate vein. Venogram was performed which demonstrated multiple cervical and chest wall collaterals with several patent collaterals with the subclavian vein. From the inferior approach, an angled tip 5 French catheter was directed to the apex of the  occluded superior vena cava. A stiff Glidewire was then used to attempt to catheter and wire recanalization of the innominate vein which was unsuccessful. Therefore, from the right arm approach, a 7 mm x 20 mm mustang balloon was inserted and positioned at the level of the jugular and subclavian confluence and inflated. From the right groin, in 89 cm BRK needle was advanced over fluoroscopic guidance with multiple live fluoro obliquities to target the balloon. Given challenge targeting the 7 mm balloon. This was exchanged for a 10 mm by 4 cm mustang balloon which was inflated in similar position. The BRK needle was advanced and tented the medial aspect of the balloon with inability to rupture/enter. Therefore, the inner stylet of the [address] needle was removed and a V18 wire was inserted which formed around the peripheral aspect of the balloon. The balloon was deflated and removed. Through the right arm access, a 15 mm loop snare was then inserted and the V18 wire was captured in the right internal jugular vein. The wire was then pulled through the right arm sheath the great through and through, flossing access. A quick cross 135 cm catheter was then inserted from the right groin sheath over the wire, exiting the right arm sheath. The wire was removed and exchanged for 0.014 inch Spartacore wire. From the groin approach, a 4 mm x 150 mm coyote balloon was inserted and balloon angioplasty was performed about the recanalized right innominate vein. There was a high-grade focal stenosis just inferior to the right clavicle. Venogram was performed from the right arm sheath after removal of the balloon which demonstrated occlusion of the right axillary  and right subclavian veins. A 125 cm, 5 French angled tip catheter was then inserted over the wire from the right groin approach exiting right upper extremity sheath. A 0.014 inch wire was removed and exchanged for a 0.035 inch stiff exchange Glidewire. Next, balloon angioplasty  was performed with a 10 mm x 4 cm mustang balloon in multiple stations from the level of the innominate vein through the right axillary vein. Intravascular ultrasound was then performed through the right heart and into the right axillary vein. 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 Sizing in length was demarcated with fluoroscopic reference using the IVUS catheter. Next, a 14 mm x 80 mm Abre self expanding stent was deployed under fluoroscopic guidance from the right subclavian vein through the superior aspect of the superior vena cava. Post balloon molding was performed with a 14 mm x 4 cm atlas balloon. Venogram demonstrated persistent multifocal occlusion/high-grade stenosis in the right axillary and peripheral right subclavian vein. There are multiple filling defects within the otherwise patent stent. Additional balloon angioplasty was performed of the right axillary and right subclavian veins as well as through the indwelling stent. Right upper extremity venogram demonstrated improved patency inflow with multiple filling defects remaining throughout the length of the stent, particularly at the level of the first rib/clavicle. Therefore, additional post balloon molding with a 14 mm x 4 cm atlas balloon was performed. Completion venogram from the right upper extremity demonstrated brisk antegrade flow through the right subclavian vein, indwelling stent, and into the superior vena cava. There are persistent moderate filling defects about the level of first rib/clavicle, which were recalcitrant to aggressive balloon molding. Intravascular ultrasound was repeated throughout the length of the stent and right subclavian vein which demonstrated significantly improved patency in excellent proximal distal wall  apposition with multiple intraluminal echogenic irregularities through the midportion of the stent. Attempt at venogram from the right IJ was a made, however the indwelling sheath and catheter had backed out of the intraluminal position and were positioned in the right neck soft tissues. This sheath, along with right upper extremity right groin sheaths were removed and hemostasis was achieved with manual compression. Sterile bandages were applied. The patient was administered 1 milligram/kilogram Lovenox injection prior to leaving the IR suite. The patient was extubated and transferred to the postanesthesia care unit in stable condition. IMPRESSION: 1. 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 2. Technically successful sharp recanalization of the right innominate vein. 3. Right subclavian to superior vena cava self expanding stent placement (14 x 80 mm Abre). PLAN: After extensive conversation with Pharmacy regarding Ms. Sifuentes' drug drug interactions, decision was made to initiate therapeutic Lovenox for 1 month and indefinite Plavix. Plan for follow-up CT chest in 1 month with concomitant clinic appointment. Ruthann Cancer, MD [facility] Radiology Electronically Signed   By: Ruthann Cancer M.D.   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 Status post unsuccessful catheter and wire central venous recanalization on 09/08/2021. She presents today for sharp recanalization due to persistent SVC syndrome symptoms. EXAM: 1. Ultrasound-guided  vascular access of the right common femoral vein, right brachial vein, and right internal jugular vein. 2. Right  upper extremity and central venography. 3. Sharp recanalization of the right innominate vein. 4. Balloon angioplasty of the right axillary vein, subclavian vein, right innominate vein, and superior vena cava. 5. Right subclavian vein to superior vena cava stent placement. 6. Intravascular ultrasound. COMPARISON:  08/21/2021, 09/08/2021 MEDICATIONS: None. ANESTHESIA/SEDATION: The procedure was performed under general anesthesia. FLUOROSCOPY TIME:  Fluoroscopy Time: 58 minutes 16 seconds (1,881 mGy). CONTRAST:  150 mL Omnipaque 300, intravenous COMPLICATIONS: None immediate. TECHNIQUE: Informed written consent was obtained from the patient after a thorough discussion of the procedural risks, benefits and alternatives. All questions were addressed. Maximal Sterile Barrier Technique was utilized including caps, mask, sterile gowns, sterile gloves, sterile drape, hand hygiene and skin antiseptic. A timeout was performed prior to the initiation of the procedure. The right groin, right upper extremity, and right neck were prepped and draped in standard fashion. The right upper extremity was then examined which demonstrated a patent right brachial vein. A small skin nick was made at the planned needle entry site. Under direct ultrasound visualization, a 21 gauge micropuncture needle was directed into the right brachial vein. An image was captured and stored in a permanent record. A Micropuncture sheath was placed. A Wholey wire was directed to the level of the subclavian vein. The micropuncture sheath was exchanged for a 6 French, 35 cm vascular sheath. A 5 [place] Kumpe the catheter was inserted to gain more central access in the subclavian vein. Limited right upper extremity venogram demonstrated irregular appearance of the central right subclavian vein with stenotic but patent communication with the central right internal jugular vein. There is no antegrade flow through the innominate vein. There are multiple cervical and  right chest wall collateral veins present. Ultrasound evaluation demonstrated patency of the right common femoral vein. A small skin nick was made at the planned needle entry site. Under direct ultrasound visualization, a 21 gauge micropuncture needle was directed into the right common femoral vein. An image was captured and stored the permanent record. A micropuncture sheath was placed. A Wholey wire was then directed to the inferior vena cava under fluoroscopic guidance and the micro puncture sheath was exchanged for a 12 French, 35 cm vascular sheath. Subsequently, in coaxial fashion, a 7 [place], 65 cm sheath was introduced over the wire into the superior vena cava. Simultaneous right upper extremity and central venogram was performed which demonstrated complete occlusion of the right innominate vein extending into the peripheral superior vena cava. The mid to central superior vena cava and azygous veins are patent. Given the patency of the internal jugular vein, right internal jugular vein access was not pursued. Ultrasound evaluation demonstrated patency of the right internal jugular vein. A small skin nick was made at the planned needle entry site. Under direct ultrasound visualization, a 21 gauge micropuncture needle was directed into the right internal jugular vein. An image was captured and stored in the permanent record. A micropuncture sheath was placed. A Wholey wire was then directed into the internal jugular vein under fluoroscopic guidance and a micropuncture sheath was exchanged for a 6 [place], 10 cm vascular sheath. A 5 French angled tip catheter was then directed to the central aspect of the right internal jugular vein at the confluence with the subclavian vein, just above the occluded innominate vein. Venogram was performed which demonstrated multiple cervical and chest wall collaterals with several patent collaterals with the  subclavian vein. From the inferior approach, an angled tip 5 French  catheter was directed to the apex of the occluded superior vena cava. A stiff Glidewire was then used to attempt to catheter and wire recanalization of the innominate vein which was unsuccessful. Therefore, from the right arm approach, a 7 mm x 20 mm mustang balloon was inserted and positioned at the level of the jugular and subclavian confluence and inflated. From the right groin, in 89 cm BRK needle was advanced over fluoroscopic guidance with multiple live fluoro obliquities to target the balloon. Given challenge targeting the 7 mm balloon. This was exchanged for a 10 mm by 4 cm mustang balloon which was inflated in similar position. The BRK needle was advanced and tented the medial aspect of the balloon with inability to rupture/enter. Therefore, the inner stylet of the [place] needle was removed and a V18 wire was inserted which formed around the peripheral aspect of the balloon. The balloon was deflated and removed. Through the right arm access, a 15 mm loop snare was then inserted and the V18 wire was captured in the right internal jugular vein. The wire was then pulled through the right arm sheath the great through and through, flossing access. A quick cross 135 cm catheter was then inserted from the right groin sheath over the wire, exiting the right arm sheath. The wire was removed and exchanged for 0.014 inch Spartacore wire. From the groin approach, a 4 mm x 150 mm coyote balloon was inserted and balloon angioplasty was performed about the recanalized right innominate vein. There was a high-grade focal stenosis just inferior to the right clavicle. Venogram was performed from the right arm sheath after removal of the balloon which demonstrated occlusion of the right axillary and right subclavian veins. A 125 cm, 5 French angled tip catheter was then inserted over the wire from the right groin approach exiting right upper extremity sheath. A 0.014 inch wire was removed and exchanged for a 0.035 inch stiff  exchange Glidewire. Next, balloon angioplasty was performed with a 10 mm x 4 cm mustang balloon in multiple stations from the level of the innominate vein through the right axillary vein. Intravascular ultrasound was then performed through the right heart and into the right axillary vein. 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 Sizing in length was demarcated with fluoroscopic reference using the IVUS catheter. Next, a 14 mm x 80 mm Abre self expanding stent was deployed under fluoroscopic guidance from the right subclavian vein through the superior aspect of the superior vena cava. Post balloon molding was performed with a 14 mm x 4 cm atlas balloon. Venogram demonstrated persistent multifocal occlusion/high-grade stenosis in the right axillary and peripheral right subclavian vein. There are multiple filling defects within the otherwise patent stent. Additional balloon angioplasty was performed of the right axillary and right subclavian veins as well as through the indwelling stent. Right upper extremity venogram demonstrated improved patency inflow with multiple filling defects remaining throughout the length of the stent, particularly at the level of the first rib/clavicle. Therefore, additional post balloon molding with a 14 mm x 4 cm atlas balloon was performed. Completion venogram from the right upper extremity demonstrated brisk antegrade flow through the right subclavian vein, indwelling stent, and into the superior vena cava. There are persistent moderate filling defects about the level of  first rib/clavicle, which were recalcitrant to aggressive balloon molding. Intravascular ultrasound was repeated throughout the length of the stent and right subclavian vein which demonstrated significantly improved  patency in excellent proximal distal wall apposition with multiple intraluminal echogenic irregularities through the midportion of the stent. Attempt at venogram from the right IJ was a made, however the indwelling sheath and catheter had backed out of the intraluminal position and were positioned in the right neck soft tissues. This sheath, along with right upper extremity right groin sheaths were removed and hemostasis was achieved with manual compression. Sterile bandages were applied. The patient was administered 1 milligram/kilogram Lovenox injection prior to leaving the IR suite. The patient was extubated and transferred to the postanesthesia care unit in stable condition. IMPRESSION: 1. 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 2. Technically successful sharp recanalization of the right innominate vein. 3. Right subclavian to superior vena cava self expanding stent placement (14 x 80 mm Abre). PLAN: After extensive conversation with Pharmacy regarding Ms. Rayborn' drug drug interactions, decision was made to initiate therapeutic Lovenox for 1 month and indefinite Plavix. Plan for follow-up CT chest in 1 month with concomitant clinic appointment. Ruthann Cancer, MD [facility] Radiology Electronically Signed   By: Ruthann Cancer M.D.   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 Status post unsuccessful catheter and wire central venous recanalization on 09/08/2021. She presents today for sharp recanalization due to persistent SVC syndrome symptoms. EXAM: 1. Ultrasound-guided vascular access of the right common femoral vein, right  brachial vein, and right internal jugular vein. 2. Right upper extremity and central venography. 3. Sharp recanalization of the right innominate vein. 4. Balloon angioplasty of the right axillary vein, subclavian vein, right innominate vein, and superior vena cava. 5. Right subclavian vein to superior vena cava stent placement. 6. Intravascular ultrasound. COMPARISON:  08/21/2021, 09/08/2021 MEDICATIONS: None. ANESTHESIA/SEDATION: The procedure was performed under general anesthesia. FLUOROSCOPY TIME:  Fluoroscopy Time: 58 minutes 16 seconds (1,881 mGy). CONTRAST:  150 mL Omnipaque 300, intravenous COMPLICATIONS: None immediate. TECHNIQUE: Informed written consent was obtained from the patient after a thorough discussion of the procedural risks, benefits and alternatives. All questions were addressed. Maximal Sterile Barrier Technique was utilized including caps, mask, sterile gowns, sterile gloves, sterile drape, hand hygiene and skin antiseptic. A timeout was performed prior to the initiation of the procedure. The right groin, right upper extremity, and right neck were prepped and draped in standard fashion. The right upper extremity was then examined which demonstrated a patent right brachial vein. A small skin nick was made at the planned needle entry site. Under direct ultrasound visualization, a 21 gauge micropuncture needle was directed into the right brachial vein. An image was captured and stored in a permanent record. A Micropuncture sheath was placed. A Wholey wire was directed to the level of the subclavian vein. The micropuncture sheath was exchanged for a 6 French, 35 cm vascular sheath. A 5 [place] Kumpe the catheter was inserted to gain more central access in the subclavian vein. Limited right upper extremity venogram demonstrated irregular appearance of the central right subclavian vein with stenotic but patent communication with the central right internal jugular vein. There is no antegrade flow  through  the innominate vein. There are multiple cervical and right chest wall collateral veins present. Ultrasound evaluation demonstrated patency of the right common femoral vein. A small skin nick was made at the planned needle entry site. Under direct ultrasound visualization, a 21 gauge micropuncture needle was directed into the right common femoral vein. An image was captured and stored the permanent record. A micropuncture sheath was placed. A Wholey wire was then directed to the inferior vena cava under fluoroscopic guidance and the micro puncture sheath was exchanged for a 12 French, 35 cm vascular sheath. Subsequently, in coaxial fashion, a 7 [place], 65 cm sheath was introduced over the wire into the superior vena cava. Simultaneous right upper extremity and central venogram was performed which demonstrated complete occlusion of the right innominate vein extending into the peripheral superior vena cava. The mid to central superior vena cava and azygous veins are patent. Given the patency of the internal jugular vein, right internal jugular vein access was not pursued. Ultrasound evaluation demonstrated patency of the right internal jugular vein. A small skin nick was made at the planned needle entry site. Under direct ultrasound visualization, a 21 gauge micropuncture needle was directed into the right internal jugular vein. An image was captured and stored in the permanent record. A micropuncture sheath was placed. A Wholey wire was then directed into the internal jugular vein under fluoroscopic guidance and a micropuncture sheath was exchanged for a 6 [place], 10 cm vascular sheath. A 5 French angled tip catheter was then directed to the central aspect of the right internal jugular vein at the confluence with the subclavian vein, just above the occluded innominate vein. Venogram was performed which demonstrated multiple cervical and chest wall collaterals with several patent collaterals with the subclavian  vein. From the inferior approach, an angled tip 5 French catheter was directed to the apex of the occluded superior vena cava. A stiff Glidewire was then used to attempt to catheter and wire recanalization of the innominate vein which was unsuccessful. Therefore, from the right arm approach, a 7 mm x 20 mm mustang balloon was inserted and positioned at the level of the jugular and subclavian confluence and inflated. From the right groin, in 89 cm BRK needle was advanced over fluoroscopic guidance with multiple live fluoro obliquities to target the balloon. Given challenge targeting the 7 mm balloon. This was exchanged for a 10 mm by 4 cm mustang balloon which was inflated in similar position. The BRK needle was advanced and tented the medial aspect of the balloon with inability to rupture/enter. Therefore, the inner stylet of the [place] needle was removed and a V18 wire was inserted which formed around the peripheral aspect of the balloon. The balloon was deflated and removed. Through the right arm access, a 15 mm loop snare was then inserted and the V18 wire was captured in the right internal jugular vein. The wire was then pulled through the right arm sheath the great through and through, flossing access. A quick cross 135 cm catheter was then inserted from the right groin sheath over the wire, exiting the right arm sheath. The wire was removed and exchanged for 0.014 inch Spartacore wire. From the groin approach, a 4 mm x 150 mm coyote balloon was inserted and balloon angioplasty was performed about the recanalized right innominate vein. There was a high-grade focal stenosis just inferior to the right clavicle. Venogram was performed from the right arm sheath after removal of the balloon which demonstrated occlusion of the right  axillary and right subclavian veins. A 125 cm, 5 French angled tip catheter was then inserted over the wire from the right groin approach exiting right upper extremity sheath. A 0.014 inch  wire was removed and exchanged for a 0.035 inch stiff exchange Glidewire. Next, balloon angioplasty was performed with a 10 mm x 4 cm mustang balloon in multiple stations from the level of the innominate vein through the right axillary vein. Intravascular ultrasound was then performed through the right heart and into the right axillary vein. 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 Sizing in length was demarcated with fluoroscopic reference using the IVUS catheter. Next, a 14 mm x 80 mm Abre self expanding stent was deployed under fluoroscopic guidance from the right subclavian vein through the superior aspect of the superior vena cava. Post balloon molding was performed with a 14 mm x 4 cm atlas balloon. Venogram demonstrated persistent multifocal occlusion/high-grade stenosis in the right axillary and peripheral right subclavian vein. There are multiple filling defects within the otherwise patent stent. Additional balloon angioplasty was performed of the right axillary and right subclavian veins as well as through the indwelling stent. Right upper extremity venogram demonstrated improved patency inflow with multiple filling defects remaining throughout the length of the stent, particularly at the level of the first rib/clavicle. Therefore, additional post balloon molding with a 14 mm x 4 cm atlas balloon was performed. Completion venogram from the right upper extremity demonstrated brisk antegrade flow through the right subclavian vein, indwelling stent, and into the superior vena cava. There are persistent moderate filling defects about the level of first rib/clavicle, which were recalcitrant to aggressive balloon molding. Intravascular ultrasound was repeated throughout the length of the stent and right  subclavian vein which demonstrated significantly improved patency in excellent proximal distal wall apposition with multiple intraluminal echogenic irregularities through the midportion of the stent. Attempt at venogram from the right IJ was a made, however the indwelling sheath and catheter had backed out of the intraluminal position and were positioned in the right neck soft tissues. This sheath, along with right upper extremity right groin sheaths were removed and hemostasis was achieved with manual compression. Sterile bandages were applied. The patient was administered 1 milligram/kilogram Lovenox injection prior to leaving the IR suite. The patient was extubated and transferred to the postanesthesia care unit in stable condition. IMPRESSION: 1. 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 2. Technically successful sharp recanalization of the right innominate vein. 3. Right subclavian to superior vena cava self expanding stent placement (14 x 80 mm Abre). PLAN: After extensive conversation with Pharmacy regarding Ms. Noorani' drug drug interactions, decision was made to initiate therapeutic Lovenox for 1 month and indefinite Plavix. Plan for follow-up CT chest in 1 month with concomitant clinic appointment. Ruthann Cancer, MD [facility] Radiology Electronically Signed   By: Ruthann Cancer M.D.   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 Status post unsuccessful catheter and wire central venous recanalization on 09/08/2021. She presents today for sharp recanalization due to  persistent SVC syndrome symptoms. EXAM: 1.  Ultrasound-guided vascular access of the right common femoral vein, right brachial vein, and right internal jugular vein. 2. Right upper extremity and central venography. 3. Sharp recanalization of the right innominate vein. 4. Balloon angioplasty of the right axillary vein, subclavian vein, right innominate vein, and superior vena cava. 5. Right subclavian vein to superior vena cava stent placement. 6. Intravascular ultrasound. COMPARISON:  08/21/2021, 09/08/2021 MEDICATIONS: None. ANESTHESIA/SEDATION: The procedure was performed under general anesthesia. FLUOROSCOPY TIME:  Fluoroscopy Time: 58 minutes 16 seconds (1,881 mGy). CONTRAST:  150 mL Omnipaque 300, intravenous COMPLICATIONS: None immediate. TECHNIQUE: Informed written consent was obtained from the patient after a thorough discussion of the procedural risks, benefits and alternatives. All questions were addressed. Maximal Sterile Barrier Technique was utilized including caps, mask, sterile gowns, sterile gloves, sterile drape, hand hygiene and skin antiseptic. A timeout was performed prior to the initiation of the procedure. The right groin, right upper extremity, and right neck were prepped and draped in standard fashion. The right upper extremity was then examined which demonstrated a patent right brachial vein. A small skin nick was made at the planned needle entry site. Under direct ultrasound visualization, a 21 gauge micropuncture needle was directed into the right brachial vein. An image was captured and stored in a permanent record. A Micropuncture sheath was placed. A Wholey wire was directed to the level of the subclavian vein. The micropuncture sheath was exchanged for a 6 French, 35 cm vascular sheath. A 5 [place] Kumpe the catheter was inserted to gain more central access in the subclavian vein. Limited right upper extremity venogram demonstrated irregular appearance of the central right subclavian vein with stenotic but patent communication  with the central right internal jugular vein. There is no antegrade flow through the innominate vein. There are multiple cervical and right chest wall collateral veins present. Ultrasound evaluation demonstrated patency of the right common femoral vein. A small skin nick was made at the planned needle entry site. Under direct ultrasound visualization, a 21 gauge micropuncture needle was directed into the right common femoral vein. An image was captured and stored the permanent record. A micropuncture sheath was placed. A Wholey wire was then directed to the inferior vena cava under fluoroscopic guidance and the micro puncture sheath was exchanged for a 12 French, 35 cm vascular sheath. Subsequently, in coaxial fashion, a 7 [place], 65 cm sheath was introduced over the wire into the superior vena cava. Simultaneous right upper extremity and central venogram was performed which demonstrated complete occlusion of the right innominate vein extending into the peripheral superior vena cava. The mid to central superior vena cava and azygous veins are patent. Given the patency of the internal jugular vein, right internal jugular vein access was not pursued. Ultrasound evaluation demonstrated patency of the right internal jugular vein. A small skin nick was made at the planned needle entry site. Under direct ultrasound visualization, a 21 gauge micropuncture needle was directed into the right internal jugular vein. An image was captured and stored in the permanent record. A micropuncture sheath was placed. A Wholey wire was then directed into the internal jugular vein under fluoroscopic guidance and a micropuncture sheath was exchanged for a 6 [place], 10 cm vascular sheath. A 5 French angled tip catheter was then directed to the central aspect of the right internal jugular vein at the confluence with the subclavian vein, just above the occluded innominate vein. Venogram was performed which demonstrated multiple cervical and  chest  wall collaterals with several patent collaterals with the subclavian vein. From the inferior approach, an angled tip 5 French catheter was directed to the apex of the occluded superior vena cava. A stiff Glidewire was then used to attempt to catheter and wire recanalization of the innominate vein which was unsuccessful. Therefore, from the right arm approach, a 7 mm x 20 mm mustang balloon was inserted and positioned at the level of the jugular and subclavian confluence and inflated. From the right groin, in 89 cm BRK needle was advanced over fluoroscopic guidance with multiple live fluoro obliquities to target the balloon. Given challenge targeting the 7 mm balloon. This was exchanged for a 10 mm by 4 cm mustang balloon which was inflated in similar position. The BRK needle was advanced and tented the medial aspect of the balloon with inability to rupture/enter. Therefore, the inner stylet of the Osino needle was removed and a V18 wire was inserted which formed around the peripheral aspect of the balloon. The balloon was deflated and removed. Through the right arm access, a 15 mm loop snare was then inserted and the V18 wire was captured in the right internal jugular vein. The wire was then pulled through the right arm sheath the great through and through, flossing access. A quick cross 135 cm catheter was then inserted from the right groin sheath over the wire, exiting the right arm sheath. The wire was removed and exchanged for 0.014 inch Spartacore wire. From the groin approach, a 4 mm x 150 mm coyote balloon was inserted and balloon angioplasty was performed about the recanalized right innominate vein. There was a high-grade focal stenosis just inferior to the right clavicle. Venogram was performed from the right arm sheath after removal of the balloon which demonstrated occlusion of the right axillary and right subclavian veins. A 125 cm, 5 French angled tip catheter was then inserted over the wire from  the right groin approach exiting right upper extremity sheath. A 0.014 inch wire was removed and exchanged for a 0.035 inch stiff exchange Glidewire. Next, balloon angioplasty was performed with a 10 mm x 4 cm mustang balloon in multiple stations from the level of the innominate vein through the right axillary vein. Intravascular ultrasound was then performed through the right heart and into the right axillary vein. 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 Sizing in length was demarcated with fluoroscopic reference using the IVUS catheter. Next, a 14 mm x 80 mm Abre self expanding stent was deployed under fluoroscopic guidance from the right subclavian vein through the superior aspect of the superior vena cava. Post balloon molding was performed with a 14 mm x 4 cm atlas balloon. Venogram demonstrated persistent multifocal occlusion/high-grade stenosis in the right axillary and peripheral right subclavian vein. There are multiple filling defects within the otherwise patent stent. Additional balloon angioplasty was performed of the right axillary and right subclavian veins as well as through the indwelling stent. Right upper extremity venogram demonstrated improved patency inflow with multiple filling defects remaining throughout the length of the stent, particularly at the level of the first rib/clavicle. Therefore, additional post balloon molding with a 14 mm x 4 cm atlas balloon was performed. Completion venogram from the right upper extremity demonstrated brisk antegrade flow through the right subclavian vein, indwelling stent, and into the superior vena cava. There are persistent  moderate filling defects about the level of first rib/clavicle, which were recalcitrant to aggressive balloon molding. Intravascular  ultrasound was repeated throughout the length of the stent and right subclavian vein which demonstrated significantly improved patency in excellent proximal distal wall apposition with multiple intraluminal echogenic irregularities through the midportion of the stent. Attempt at venogram from the right IJ was a made, however the indwelling sheath and catheter had backed out of the intraluminal position and were positioned in the right neck soft tissues. This sheath, along with right upper extremity right groin sheaths were removed and hemostasis was achieved with manual compression. Sterile bandages were applied. The patient was administered 1 milligram/kilogram Lovenox injection prior to leaving the IR suite. The patient was extubated and transferred to the postanesthesia care unit in stable condition. IMPRESSION: 1. 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 2. Technically successful sharp recanalization of the right innominate vein. 3. Right subclavian to superior vena cava self expanding stent placement (14 x 80 mm Abre). PLAN: After extensive conversation with Pharmacy regarding Ms. Rooke' drug drug interactions, decision was made to initiate therapeutic Lovenox for 1 month and indefinite Plavix. Plan for follow-up CT chest in 1 month with concomitant clinic appointment. Ruthann Cancer, MD [facility] Radiology Electronically Signed   By: Ruthann Cancer M.D.   On: 11/24/2021 09:49   IR EMBO TUMOR ORGAN ISCHEMIA INFARCT INC GUIDE ROADMAPPING  Result Date: 11/04/2021 INDICATION: 45 year old female with history of uterine fibroids and menorrhagia resulting and chronic iron deficiency anemia requiring iron transfusions. EXAM: 1. Ultrasound-guided vascular access of the left radial artery. 2. Pelvic angiogram. 3. Selective catheterization of bilateral uterine arteries. 4. Particle embolization of the bilateral uterine arteries. MEDICATIONS: Vancomycin 1.5 IV.  The antibiotic was administered within 1 hour of the procedure. 8 mg Zofran, intravenous. 1 g intravenous Tylenol. ANESTHESIA/SEDATION: Moderate (conscious) sedation was employed during this procedure. A total of Versed 0.5 mg and Fentanyl 50 mcg was administered intravenously. Moderate Sedation Time: 82 minutes. The patient's level of consciousness and vital signs were monitored continuously by radiology nursing throughout the procedure under my direct supervision. CONTRAST:  17m OMNIPAQUE IOHEXOL 300 MG/ML SOLN, 534mOMNIPAQUE IOHEXOL 300 MG/ML SOLN FLUOROSCOPY TIME:  Fluoroscopy Time: 20 minutes 42 seconds (1,812 mGy). COMPLICATIONS: None immediate. PROCEDURE: Informed consent was obtained from the patient following explanation of the procedure, risks, benefits and alternatives. The patient understands, agrees and consents for the procedure. All questions were addressed. A time out was performed prior to the initiation of the procedure. Maximal barrier sterile technique utilized including caps, mask, sterile gowns, sterile gloves, large sterile drape, hand hygiene, and Betadine prep. The left wrist was prepped and draped in standard fashion. BaNickolas Madridest was "A". Subdermal Local anesthesia was provided at the planned needle entry site. Under direct ultrasound visualization, the left radial artery was punctured with a 4 cm micropuncture needle. A permanent image was captured and stored. A micropuncture wire was inserted without difficulty. Additional local anesthetic was administered and a small skin nick was made. The needle was removed and a 4/5 French slender sheath was inserted. A radial cocktail was slowly administered. Through the sheath, a 5 French MG2 Glide catheter was inserted over a Bentson wire under direct fluoroscopic guidance from the left upper extremity to the abdominal aorta. The right common and internal iliac arteries were selected. Right internal iliac angiogram was then performed which  demonstrated a patent uterine artery arising from the anterior division. A Renegade high flow  and 0.016 fathom wire were used to select the right uterine artery. The horizontal portion of the uterine artery was catheterized and angiogram was performed. The right hemi uterus was visualized with at least 1 hyperenhancing mass compatible with a uterine fibroid. There was no evidence of vesicular, rectal, or vaginal branches. Embolization was then performed with 500-700 micron embospheres. After injection of approximately 1 vial, there was noted to be sluggish flow in the uterine artery. 3 mL of preservative-free lidocaine were then injected intra-arterially. The microcatheter was then removed. The 5 French catheter was then retracted and used to select the left common and internal iliac artery. Left internal iliac angiogram demonstrated a patent uterine artery arising from the anterior division. A Renegade high flow and 0.016 fathom wire were used to select the left uterine artery. The horizontal portion of the uterine artery was catheterized and angiogram was performed. The left hemi uterus was visualized with at least 1 hyperenhancing mass compatible with a uterine fibroid. There was no evidence of vesicular, rectal, or vaginal branches. Embolization was then performed with 500-700 micron embospheres. After injection of approximately 1 vial, there was noted to be sluggish flow in the uterine artery. 3 mL of preservative-free lidocaine were then injected intra-arterially. The microcatheter was then removed. The 5 French catheter was then retracted to the abdominal aorta. Pelvic angiogram was then performed which demonstrated widely patent bilateral inflow vessels with relative pruning of the previously visualized bilateral uterine arteries. The catheter was then removed. A TR band was applied to the left radial sheath which was then removed without complication. The patient tolerated procedure well was transferred to  the post procedural area in good condition. IMPRESSION: Technically successful bilateral uterine artery embolization. Ruthann Cancer, MD [facility] Radiology Electronically Signed   By: Ruthann Cancer M.D.   On: 11/04/2021 14:53
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
## 2021-12-01 NOTE — Procedures (Signed)
Arterial Catheter Insertion Procedure Note  Jane Edwards  [id number]  18-Sep-1976  Date:12/13/2021  Time:7:31 PM    Provider Performing: Otilio Carpen Bruno Leach    Procedure: Insertion of Arterial Line [phone]) with US guidance ([id number])   Indication(s) Blood pressure monitoring and/or need for frequent ABGs  Consent Risks of the procedure as well as the alternatives and risks of each were explained to the patient and/or caregiver.  Consent for the procedure was obtained and is signed in the bedside chart  Anesthesia None   Time Out Verified patient identification, verified procedure, site/side was marked, verified correct patient position, special equipment/implants available, medications/allergies/relevant history reviewed, required imaging and test results available.   Sterile Technique Maximal sterile technique including full sterile barrier drape, hand hygiene, sterile gown, sterile gloves, mask, hair covering, sterile ultrasound probe cover (if used).   Procedure Description Area of catheter insertion was cleaned with chlorhexidine and draped in sterile fashion. With real-time ultrasound guidance an arterial catheter was placed into the left femoral artery.  Appropriate arterial tracings confirmed on monitor.     Complications/Tolerance None; patient tolerated the procedure well.   EBL Minimal   Specimen(s) None  Otilio Carpen Breaunna Gottlieb, PA-C

## 2021-12-01 NOTE — ED Notes (Signed)
Unable to obtain bp

## 2021-12-01 NOTE — ED Provider Notes (Addendum)
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 shock as she has a leukocytosis and lactic acidosis. Unclear etiology if this is infectious related. This could also be a complication from her IR procedure.  I discussed these findings with the patient and her family. I explained to her that the patient has had a large hemoglobin drop since yesterday and will likely require blood products at some point during her admission. 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   Events/Interventions:  - 1300: Due to initial hypotension, patient was initially fluid resuscitated with 1 L IVF - 1422: Spoke with on call physician from IR, Ruthann Cancer, MD. Recommends right groin pseudoaneurysm image. IR will continue to follow this patient.  - 1600: Patient continues to be borderline hypotensive with fluctuant blood pressures. Her mental status, however, has improved from prior. With concern for sepsis and  acute blood loss, patient will be fluid resuscitated with 2 L IVF. starting abx. - 1601: Ultrasound negative for pseudoaneurysm, however, 8 x 6 cm hypoechoic area with irregular borders found concerning for hematoma v abscess. I feel that this is likely to be a hematoma given the clinical picture. I reviewed these findings with Dr. Scot Dock. - 1703: spoke with Dr. Dante Gang from [facility]. He is concerned that patient will require critical care due to concern for large volume blood loss. Plan to consult critical care. - 8270: Spoke with Whitney, from critical care.  She came and saw patient is very concerned about the right groin hematoma.  She recommended speaking with vascular surgery for further management.  - 7867:  On reassessment, patient remains hypotensive despite fluid resuscitation measures. Patients mentation waxes and wanes. Plan to admit patient to ICU given worsening status. She will require arterial line and central line placement.  - 5449: Spoke with vascular surgery , Dr. Scot Dock, who also plan to come see patient.  Recommends CT noncontrast to evaluate for retroperitoneal hematoma.  Dispo Plan: admit  I have seen and evaluated this patient in conjunction with my attending physician, Dr. Eulis Foster, who assisted me with the assessment, evaluation, and plan of this patient throughout their course in the ED. He has made changes to this plan accordingly.  Portions of this note were generated with Lobbyist. Dictation errors may occur despite best attempts at proofreading.    Final  Clinical Impression(s) / ED Diagnoses Final diagnoses:  Acute blood loss anemia  Right groin pain    Rx / DC Orders ED Discharge Orders     None        Adolphus Birchwood, PA-C 12/08/2021 1915    Adolphus Birchwood, PA-C 11/21/2021 1924    Sheila Oats 11/30/2021 2123    Daleen Bo, MD 12/02/21 1144

## 2021-12-01 NOTE — Progress Notes (Signed)
[place] Progress Note Patient Name: Jane Edwards DOB: 01-30-1976 MRN: [id number]   Date of Service  12/10/2021  HPI/Events of Note  Patient admitted with a right groin hematoma and suspicion for a retroperitoneal hematoma / acute blood loss anemia following an SVC stenting procedure that required right femoral vein access. Patient is a Sales promotion account executive witness.  eICU Interventions  New Patient Evaluation.        Jane Edwards 11/30/2021, 11:12 PM

## 2021-12-01 NOTE — ED Provider Notes (Signed)
°  Face-to-face evaluation   History: She presents for evaluation of right groin pain, that was Serpate urine after make her fall in the bathroom this morning.  She recently had peripheral vein stenting, following multiple venous clots.  She is currently anticoagulated.  She also complains of generalized pain.  She lives with family members who are here.  She has received iron infusions for anemia multiple times.  Physical exam: Alert, lucid.  Right groin with large hematoma which is very tender to palpation.  Overlying skin is mildly red.  It does not appear infected.  Abdomen is soft and mildly tender.  Medical screening examination/treatment/procedure(s) were conducted as a shared visit with non-physician practitioner(s) and myself.  I personally evaluated the patient during the encounter    Daleen Bo, MD 12/02/21 1143

## 2021-12-01 NOTE — Progress Notes (Addendum)
Pharmacy Antibiotic Note  Jane Edwards is a 45 y.o. female admitted on 12/08/2021 with bacteremia.  Pharmacy has been consulted for cefepime and vancomycin dosing.  Patient with a history of HTN, DM type 2, Seizures, Iron deficiency with q19month iron transfusion, Menorrhagia, Poor venous access with prior indwelling subclavian vein ports, Uterine embolization, CHF, Chronic fatigue, Chronic headaches, Depression with anxiety, HTN, HLD, OSA, PTSD. Patient presenting with weakness.  Patient's serum creatinine is 1.72 which is significantly above baseline. WBC 16.8; LA 6.1; T 99 F  Plan: Cefepime 2g q8h --Ceftriaxone allergy noted. No reaction listed. Chart review of OSH reveals "Pt told not to take while in hospital". Cefepime given in ED - dose tolerated. CCM notified and wishes to continue with cefepime. Vancomycin 2500 mg once, subsequent dosing as indicated per random vancomycin level until renal function stable and/or improved, at which time scheduled dosing can be considered Trend WBC, fever, renal function, and clinical course Follow-up cultures and de-escalate antibiotics as appropriate.     Temp (24hrs), Avg:98.7 F (37.1 C), Min:97.5 F (36.4 C), Max:99.8 F (37.7 C)  Recent Labs  Lab 11/30/21 0000 11/29/2021 1300 12/02/2021 1332  WBC 5.6 16.8*  --   CREATININE  --  1.72* 1.60*  LATICACIDVEN  --  6.1*  --     Estimated Creatinine Clearance: 59.4 mL/min (A) (by C-G formula based on SCr of 1.6 mg/dL (H)).    Allergies  Allergen Reactions   Gabapentin Anaphylaxis   Ibuprofen Anaphylaxis   Iodinated Diagnostic Agents Hives   Latex Hives   Dilaudid [Hydromorphone]     Caused seizure   Lisinopril Other (See Comments)    Can not take with seizure medication   Rocephin [Ceftriaxone] Other (See Comments)    unknown   Antimicrobials this admission: cefepime 12/13 >>  vancomycin 12/13 >>   Microbiology results: Pending  Thank you for allowing pharmacy to be a part  of this patients care.  Lorelei Pont, PharmD, BCPS 11/25/2021 7:31 PM ED Clinical Pharmacist -  [phone]

## 2021-12-01 NOTE — ED Notes (Signed)
Pt unable to urinate since she has been here.  Unable to assess for urine on bladder scanner.  Provider notified for foley placement

## 2021-12-01 NOTE — ED Triage Notes (Signed)
EMS stated, she had ablation 2 weeks ago and stent on Thursday. Discharged yesterday and today she is saying she thinks her wound is infected and is weaker to walk.

## 2021-12-01 NOTE — Progress Notes (Signed)
Patient is okay for ct scan of pelvis despite minimally elevated beta hcg given risk/benefit profile.  Erskine Emery md PCCM

## 2021-12-01 NOTE — Consult Note (Signed)
ASSESSMENT & PLAN   RIGHT GROIN HEMATOMA: This patient has a large hematoma in the right groin.  Of note this was a venous stick under ultrasound guidance.  Given a drop in hemoglobin I would recommend a CT of the abdomen pelvis without contrast to rule out a retroperitoneal hematoma.  I have explained that from our standpoint there are 2 options.  We can continue to observe this and the hematoma will gradually reabsorb over time although this will likely take many weeks.  I think this is the best approach.  Alternatively, we could evacuate the hematoma which would be associated with an extremely high risk of wound healing problems and wound infection given her morbid obesity.  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  If she developed breakdown of the skin we could potentially have to reconsider surgery.  Vascular surgery will follow.  REASON FOR CONSULT:    Right groin hematoma.  The consult is requested by Dr. Tamala Julian.  HPI:   Jane Edwards is a 45 y.o. female with a history of SVC syndrome.  She has a history of chronic iron deficiency requiring iron transfusions.  She has had bilateral subclavian vein ports both of which have been removed as she developed central venous occlusions with SVC syndrome.  She had attempted recanalization on 09/08/2021 that was unsuccessful.  She returned on 11/23/2021 for sharp recanalization for persistent SVC syndrome.  She underwent ultrasound-guided access to the right common femoral vein with sharp recanalization of the right innominate vein and balloon angioplasty of the right axillary vein, subclavian vein, right innominate vein, and superior vena cava.  She had a right subclavian vein to superior vena cava stent placed.  It is somewhat difficult to get the history from the patient but she sounds like she was admitted overnight.  She was on subcu Lovenox.  She states that she has had swelling in the groin  since she went home.  She describes moderate tenderness in the right groin.  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  Past Medical History:  Diagnosis Date   Alternating exotropia 07/21/2017   Alveolar hypoventilation    Anemia    Arthritis    Asthma    BMI 60.0-69.9, adult ([place]) 11/22/2017   CHF (congestive heart failure) (HCC)    Chronic fatigue    Chronic headaches 03/31/2016   Class 3 obesity with alveolar hypoventilation, serious comorbidity, and body mass index (BMI) of 60.0 to 69.9 in adult [facility]) 03/17/2018   Depression with anxiety    Dysfunctional uterine bleeding 02/03/2016   Edema    Epileptic spasms, not intractable, without status epilepticus ([place]) 07/21/2017   Formatting of this note might be different from the original. Last seizure with OSH care was 2/18 Formatting of this note might be different from the original. Formatting of this note might be different from the original. Last seizure with OSH care was 2/18   Essential hypertension 09/10/2014   Excessive daytime sleepiness 03/17/2018   Fibroids    uterine   Generalized idiopathic epilepsy and epileptic syndromes, without status epilepticus, not intractable ([place]) 03/31/2016   Heart murmur    Hypercholesterolemia    Hypertension    Hypokalemia    Hypomagnesemia    Iron deficiency anemia due to chronic blood loss 02/08/2013   Overview:  Overview:  Baseline Hgb of 3-4; Jehovah's Witness so no blood products Overview:  Baseline  Hgb of 3-4; Jehovah's Witness so no blood products Overview:  Uterine fibroids  IV iron infusion twice every 2 months  Power port right chest    Malaise and fatigue 02/03/2016   Menorrhagia    Non-convulsive status epilepticus ([place]) 01/18/2017   OSA on CPAP 08/29/2017   Patient is Jehovah's Witness 03/31/2016   Persistent cognitive impairment 10/25/2017   Physical debility    morbid obesity, epilepsy, right ankle fusion   Pseudoseizures ([place]) 11/13/2018   PTSD  (post-traumatic stress disorder) 11/22/2017   Seizure disorder ([place]) 08/29/2017   Seizures ([place]) 03/17/2018   Severe anemia    Sleep choking syndrome 03/17/2018   Super obesity 10/25/2017   SVC syndrome    has had several postsplaced, patient recieves Iron via port.   Tachycardia    on metoprolol to treat   Type 2 diabetes mellitus with other circulatory complications (HCC)    Vitamin D deficiency     Family History  Problem Relation Age of Onset   Hypertension Neg Hx    Heart disease Neg Hx    Cancer Neg Hx    Diabetes Neg Hx     SOCIAL HISTORY: Social History   Tobacco Use   Smoking status: Never   Smokeless tobacco: Never  Substance Use Topics   Alcohol use: No    Allergies  Allergen Reactions   Gabapentin Anaphylaxis   Ibuprofen Anaphylaxis   Iodinated Diagnostic Agents Hives   Latex Hives   Dilaudid [Hydromorphone]     Caused seizure   Lisinopril Other (See Comments)    Can not take with seizure medication   Rocephin [Ceftriaxone] Other (See Comments)    unknown    Current Facility-Administered Medications  Medication Dose Route Frequency Provider Last Rate Last Admin   0.9 %  sodium chloride infusion   Intra-arterial PRN Candee Furbish, MD       diphenhydrAMINE (BENADRYL) capsule 50 mg  50 mg Oral Once Loeffler, Adora Fridge, PA-C       Or   diphenhydrAMINE (BENADRYL) injection 50 mg  50 mg Intravenous Once Loeffler, Grace C, PA-C       docusate sodium (COLACE) capsule 100 mg  100 mg Oral BID PRN Candee Furbish, MD       lactated ringers bolus 1,000 mL  1,000 mL Intravenous Once Loeffler, Shirlee Limerick C, PA-C       polyethylene glycol (MIRALAX / GLYCOLAX) packet 17 g  17 g Oral Daily PRN Candee Furbish, MD       [START ON 12/02/2021] venlafaxine XR (EFFEXOR-XR) 24 hr capsule 75 mg  75 mg Oral Q breakfast Candee Furbish, MD       Current Outpatient Medications  Medication Sig Dispense Refill   amLODipine (NORVASC) 10 MG tablet Take 10 mg by mouth daily.      bumetanide (BUMEX) 1 MG tablet Take 1 mg by mouth daily.     carbamazepine (TEGRETOL) 200 MG tablet Take 1.5 tablets (300 mg total) by mouth 2 (two) times daily. (Patient taking differently: Take 300 mg by mouth daily.) 270 tablet 3   carvedilol (COREG) 25 MG tablet Take 25 mg by mouth 2 (two) times daily with a meal.     Cholecalciferol (D3-50) 1.25 MG (50000 UT) capsule Take 50,000 Units by mouth once a week.     clopidogrel (PLAVIX) 75 MG tablet Take 1 tablet (75 mg total) by mouth daily. 30 tablet 0   desvenlafaxine (PRISTIQ) 100 MG 24 hr tablet Take  1 tablet (100 mg total) by mouth daily. 30 tablet 2   diphenhydrAMINE (BENADRYL) 50 MG capsule Take one capsule at 0800 11/16 1 capsule 0   diphenhydrAMINE (BENADRYL) 50 MG capsule Take one capsule 1 hour prior to procedure. 1 capsule 0   enoxaparin (LOVENOX) 150 MG/ML injection Inject 0.94 mLs (140 mg total) into the skin every 12 (twelve) hours. 60 mL 0   ferrous sulfate 325 (65 FE) MG tablet Take 325 mg by mouth daily with breakfast.     magnesium oxide (MAG-OX) 400 MG tablet Take 400 mg by mouth daily.     medroxyPROGESTERone Acetate 150 MG/ML SUSY Inject 1 mL into the muscle every 3 (three) months.     predniSONE & diphenhydrAMINE (CONTRAST ALLERGY PREMED PACK) 3 x 50 MG & 1 x 50 MG KIT Take 50 mg by mouth as directed. 1 kit 0   promethazine (PHENERGAN) 12.5 MG tablet Take 1 tablet (12.5 mg total) by mouth every 4 (four) hours as needed for nausea or vomiting. 15 tablet 0   spironolactone (ALDACTONE) 50 MG tablet Take 50 mg by mouth daily.     topiramate ER (QUDEXY XR) 150 MG CS24 sprinkle capsule Take 2 capsules by mouth every day 60 capsule 11   traZODone (DESYREL) 100 MG tablet Take 0.5-1 tablets (50-100 mg total) by mouth at bedtime. (Patient taking differently: Take 50 mg by mouth at bedtime.) 30 tablet 0   furosemide (LASIX) 40 MG tablet Take 1 tablet (40 mg total) by mouth 2 (two) times daily. 60 tablet 0    REVIEW OF SYSTEMS:  [X]  denotes positive finding, [ ] denotes negative finding Cardiac  Comments:  Chest pain or chest pressure:    Shortness of breath upon exertion:    Short of breath when lying flat:    Irregular heart rhythm:        Vascular    Pain in calf, thigh, or hip brought on by ambulation:    Pain in feet at night that wakes you up from your sleep:     Blood clot in your veins:    Leg swelling:         Pulmonary    Oxygen at home:    Productive cough:     Wheezing:         Neurologic    Sudden weakness in arms or legs:     Sudden numbness in arms or legs:     Sudden onset of difficulty speaking or slurred speech:    Temporary loss of vision in one eye:     Problems with dizziness:         Gastrointestinal    Blood in stool:     Vomited blood:         Genitourinary    Burning when urinating:     Blood in urine:        Psychiatric    Major depression:         Hematologic    Bleeding problems:    Problems with blood clotting too easily:        Skin    Rashes or ulcers:        Constitutional    Fever or chills:    -  PHYSICAL EXAM:   Vitals:   12/18/2021 1630 12/12/2021 1645 12/16/2021 1745 11/24/2021 1800  BP: (!) 93/58 (!) 102/91 133/73 98/86  Pulse:  100    Resp: (!) 24 20 (!) 22 16  Temp:  TempSrc:      SpO2:  99%     There is no height or weight on file to calculate BMI. GENERAL: The patient is an obese female, in no acute distress. The vital signs are documented above. CARDIAC: There is a regular rate and rhythm.  VASCULAR: I do not detect carotid bruits. She has a large hematoma in the right groin so I cannot assess her pulse. She has a biphasic posterior tibial and dorsalis pedis signal bilaterally. She has mild bilateral lower extremity swelling. PULMONARY: There is good air exchange bilaterally without wheezing or rales. ABDOMEN: Soft and non-tender with normal pitched bowel sounds.  MUSCULOSKELETAL: There are no major deformities. NEUROLOGIC: No focal  weakness or paresthesias are detected. SKIN: The skin is under moderate tension in the right groin.   PSYCHIATRIC: The patient has a normal affect.  DATA:    DUPLEX RIGHT GROIN: I have independently interpreted the duplex of the right groin.  There is no evidence of pseudoaneurysm.  There appears to be a hematoma which measures 6 cm x 8 cm.  LABS: Her hemoglobin was 7.9 but she subsequently had 2 repeats that were 9.2.  Her white blood cell count is 16.8.  Platelets 278,000.  Her GFR is 37.  Deitra Mayo [facility]

## 2021-12-01 NOTE — Progress Notes (Signed)
Supervising Physician: Ruthann Cancer  Patient Status:  [facility]  Chief Complaint:   Pain all over, 8 days s/p recanalization of right innominate vein with stent across the right subclavian to superior vena cava; access through right groin  Subjective:  Received call from Ms. Stark's mother, Mrs. Florene Glen, stating they brought Jane Edwards to ED via ambulance today because of her extreme pain and discoloration at groin access site.   Pt is accompanied by both parents in the ED and expresses that she hurts all over, then specifies most of her pain in in her right leg/hip and right shoulder.  Mom helps provide history and states overall, Jane Edwards's facial swelling has been better but after a fall today, it seems to have returned.  Ms.  Florene Glen reports they have been medication compliant and have only missed this morning's doses.  She is concerned that the skin above the groin access site is darker than usual and causing [place] pain.  She is also wondering if the site may be infected.  She is advocating for pain reduction in the ED.    At time of exam, IV team just leaving room after establishing line in R forearm.   At conclusion of exam, Lab entering to draw labs.  Allergies: Gabapentin, Ibuprofen, Iodinated diagnostic agents, Latex, Dilaudid [hydromorphone], Lisinopril, and Rocephin [ceftriaxone]  Medications: Prior to Admission medications   Medication Sig Start Date End Date Taking? Authorizing Provider  amLODipine (NORVASC) 10 MG tablet Take 10 mg by mouth daily. 01/04/20   [provider]  bumetanide (BUMEX) 1 MG tablet Take 1 mg by mouth daily.    [provider]  carbamazepine (TEGRETOL) 200 MG tablet Take 1.5 tablets (300 mg total) by mouth 2 (two) times daily. Patient taking differently: Take 300 mg by mouth daily. 02/05/21   Kathrynn Ducking, MD  carvedilol (COREG) 25 MG tablet Take 25 mg by mouth 2 (two) times daily with a meal. 11/06/18   [provider]   Cholecalciferol (D3-50) 1.25 MG (50000 UT) capsule Take 50,000 Units by mouth once a week.    [provider]  clopidogrel (PLAVIX) 75 MG tablet Take 1 tablet (75 mg total) by mouth daily. 11/25/21 12/25/21  Han, Aimee H, PA-C  desvenlafaxine (PRISTIQ) 100 MG 24 hr tablet Take 1 tablet (100 mg total) by mouth daily. 09/28/21   Arfeen, Arlyce Harman, MD  diphenhydrAMINE (BENADRYL) 50 MG capsule Take one capsule at 0800 11/16 11/04/21   Allred, Darrell K, PA-C  diphenhydrAMINE (BENADRYL) 50 MG capsule Take one capsule 1 hour prior to procedure. 11/06/21   Candiss Norse A, PA-C  enoxaparin (LOVENOX) 150 MG/ML injection Inject 0.94 mLs (140 mg total) into the skin every 12 (twelve) hours. 11/24/21 12/26/21  Han, Aimee H, PA-C  ferrous sulfate 325 (65 FE) MG tablet Take 325 mg by mouth daily with breakfast.    [provider]  furosemide (LASIX) 40 MG tablet Take 1 tablet (40 mg total) by mouth 2 (two) times daily. 09/07/21 10/07/21  Barb Merino, MD  magnesium oxide (MAG-OX) 400 MG tablet Take 400 mg by mouth daily.    [provider]  medroxyPROGESTERone Acetate 150 MG/ML SUSY Inject 1 mL into the muscle every 3 (three) months. 07/11/21   [provider]  predniSONE & diphenhydrAMINE (CONTRAST ALLERGY PREMED PACK) 3 x 50 MG & 1 x 50 MG KIT Take 50 mg by mouth as directed. Patient not taking: Reported on 11/17/2021 09/08/21   Jacqualine Mau, NP  promethazine (PHENERGAN) 12.5 MG tablet Take 1 tablet (12.5 mg total) by mouth every 4 (four) hours as needed for nausea or vomiting. 11/04/21   Allred, Shirlyn Goltz, PA-C  spironolactone (ALDACTONE) 50 MG tablet Take 50 mg by mouth daily.    [provider]  topiramate ER (QUDEXY XR) 150 MG CS24 sprinkle capsule Take 2 capsules by mouth every day 11/16/21   Alric Ran, MD  traZODone (DESYREL) 100 MG tablet Take 0.5-1 tablets (50-100 mg total) by mouth at bedtime. Patient taking differently: Take 50 mg by mouth at  bedtime. 09/28/21   Arfeen, Arlyce Harman, MD     Vital Signs: BP (!) 121/105    Pulse 99    Temp (!) 97.5 F (36.4 C) (Oral)    Resp 16    LMP  (LMP Unknown) Comment: Irregular - unsure when the last   SpO2 92%   Physical Exam Vitals reviewed.  Constitutional:      Appearance: She is not ill-appearing or diaphoretic.  HENT:     Head: Atraumatic.     Mouth/Throat:     Mouth: Mucous membranes are moist.     Pharynx: Oropharynx is clear.  Eyes:     Extraocular Movements: Extraocular movements intact.  Cardiovascular:     Rate and Rhythm: Regular rhythm. Tachycardia present.     Pulses: Normal pulses.  Pulmonary:     Effort: Pulmonary effort is normal.  Musculoskeletal:     Cervical back: Neck supple.  Skin:    Comments: Right groin: site clean and dry, no erythema; there is ecchymosis around access site extending midway onto the mons and upward to the level of the abdominal pannus.  Much of this region is firm compared to nearby skin and tender to palpation.    Neurological:     General: No focal deficit present.     Mental Status: She is alert. Mental status is at baseline.    Imaging: DG Chest Port 1 View  Result Date: 12/14/2021 CLINICAL DATA:  Sepsis, wound infection. EXAM: PORTABLE CHEST 1 VIEW COMPARISON:  None. FINDINGS: The heart size and mediastinal contours are within normal limits. Vascular stent overlying the right subclavian vessel. Both lungs are clear. The visualized skeletal structures are unremarkable. IMPRESSION: No active disease. Electronically Signed   By: Keane Police D.O.   On: 12/18/2021 11:56    Labs:  CBC: Recent Labs    11/04/21 0721 11/23/21 0850 11/24/21 0631 11/30/21 0000  WBC 5.6 5.0 10.0 5.6  HGB 14.1 15.0 13.1 12.2  HCT 46.0 46.8* 40.4 37  PLT 211 212 174 161    COAGS: Recent Labs    09/04/21 1344 11/04/21 0721 11/23/21 0850  INR 1.0 1.0 1.0  APTT 29  --   --     BMP: Recent Labs    01/14/21 1150 04/07/21 0000 09/07/21 0152  11/04/21 0721 11/23/21 0850 11/24/21 0631  NA 140   < > 133* 135 135 134*  K 4.4   < > 3.3* 4.5 4.3 4.0  CL 109*   < > 95* 107 107 103  CO2 18*   < > 26 17* 20* 21*  GLUCOSE 123*   < > 133* 135* 156* 126*  BUN 8   < > '13 9 10 9  ' CALCIUM 9.1   < > 9.1 9.4 9.3 8.7*  CREATININE 0.86   < > 0.94 0.81 0.92 0.97  GFRNONAA 83   < > >60 >60 >60 >60  GFRAA 96  --   --   --   --   --    < > =  values in this interval not displayed.    LIVER FUNCTION TESTS: Recent Labs    01/14/21 1150 04/07/21 0000 07/31/21 0000 09/03/21 1546  BILITOT 0.3  --   --  0.6  AST 24 27 46* 76*  ALT 18 21 32 69*  ALKPHOS 108 137* 101 93  PROT 7.0  --   --  6.7  ALBUMIN 4.1 4.3 4.0 3.6    Assessment and Plan:  Denell Lungren is 8 days s/p recanalization of right innominate vein with stent across the right subclavian to superior vena cava.  Her bleeding/ecchymosis at right groin is an unfortunate side effect of her procedure.  Would recommend a vascular US to check for pseudoaneurysm. IR will follow while patient is in house and are availabile for additional consult as needed.  Electronically Signed: Pasty Spillers, PA 11/24/2021, 12:55 PM   I spent a total of 25 Minutes at the the patient's bedside AND on the patient's hospital floor or unit, greater than 50% of which was counseling/coordinating care for right groin pain.

## 2021-12-02 ENCOUNTER — Inpatient Hospital Stay (HOSPITAL_COMMUNITY): Payer: Medicare Other

## 2021-12-02 DIAGNOSIS — R4182 Altered mental status, unspecified: Secondary | ICD-10-CM

## 2021-12-02 DIAGNOSIS — I97638 Postprocedural hematoma of a circulatory system organ or structure following other circulatory system procedure: Secondary | ICD-10-CM | POA: Diagnosis not present

## 2021-12-02 HISTORY — PX: IR ANGIOGRAM PELVIS SELECTIVE OR SUPRASELECTIVE: IMG661

## 2021-12-02 HISTORY — PX: IR US GUIDE VASC ACCESS LEFT: IMG2389

## 2021-12-02 HISTORY — PX: IR ANGIOGRAM SELECTIVE EACH ADDITIONAL VESSEL: IMG667

## 2021-12-02 HISTORY — PX: IR EMBO ART  VEN HEMORR LYMPH EXTRAV  INC GUIDE ROADMAPPING: IMG5450

## 2021-12-02 LAB — CBC
HCT: 16.1 % — ABNORMAL LOW (ref 36.0–46.0)
HCT: 15.4 % — ABNORMAL LOW (ref 36.0–46.0)
Hemoglobin: 5 g/dL — CL (ref 12.0–15.0)
Hemoglobin: 5.1 g/dL — CL (ref 12.0–15.0)
MCH: 28.2 pg (ref 26.0–34.0)
MCH: 28.8 pg (ref 26.0–34.0)
MCHC: 31.1 g/dL (ref 30.0–36.0)
MCHC: 33.1 g/dL (ref 30.0–36.0)
MCV: 91 fL (ref 80.0–100.0)
MCV: 87 fL (ref 80.0–100.0)
Platelets: 308 10*3/uL (ref 150–400)
Platelets: 235 10*3/uL (ref 150–400)
RBC: 1.77 MIL/uL — ABNORMAL LOW (ref 3.87–5.11)
RBC: 1.77 MIL/uL — ABNORMAL LOW (ref 3.87–5.11)
RDW: 13.3 % (ref 11.5–15.5)
RDW: 13.2 % (ref 11.5–15.5)
WBC: 32.2 10*3/uL — ABNORMAL HIGH (ref 4.0–10.5)
WBC: 36.1 10*3/uL — ABNORMAL HIGH (ref 4.0–10.5)
nRBC: 0.1 % (ref 0.0–0.2)
nRBC: 0.1 % (ref 0.0–0.2)

## 2021-12-02 LAB — COMPREHENSIVE METABOLIC PANEL
ALT: 144 U/L — ABNORMAL HIGH (ref 0–44)
AST: 240 U/L — ABNORMAL HIGH (ref 15–41)
Albumin: 2.4 g/dL — ABNORMAL LOW (ref 3.5–5.0)
Alkaline Phosphatase: 65 U/L (ref 38–126)
BUN: 21 mg/dL — ABNORMAL HIGH (ref 6–20)
CO2: <7 mmol/L — ABNORMAL LOW (ref 22–32)
Calcium: 7.8 mg/dL — ABNORMAL LOW (ref 8.9–10.3)
Chloride: 107 mmol/L (ref 98–111)
Creatinine, Ser: 2.68 mg/dL — ABNORMAL HIGH (ref 0.44–1.00)
GFR, Estimated: 22 mL/min — ABNORMAL LOW (ref >60)
Glucose, Bld: 121 mg/dL — ABNORMAL HIGH (ref 70–99)
Potassium: 5.4 mmol/L — ABNORMAL HIGH (ref 3.5–5.1)
Sodium: 134 mmol/L — ABNORMAL LOW (ref 135–145)
Total Bilirubin: 0.9 mg/dL (ref 0.3–1.2)
Total Protein: 4.9 g/dL — ABNORMAL LOW (ref 6.5–8.1)

## 2021-12-02 LAB — BLOOD CULTURE ID PANEL (REFLEXED) - BCID2

## 2021-12-02 LAB — BASIC METABOLIC PANEL
Anion gap: 20 — ABNORMAL HIGH (ref 5–15)
BUN: 25 mg/dL — ABNORMAL HIGH (ref 6–20)
CO2: 15 mmol/L — ABNORMAL LOW (ref 22–32)
Calcium: 7.3 mg/dL — ABNORMAL LOW (ref 8.9–10.3)
Chloride: 105 mmol/L (ref 98–111)
Creatinine, Ser: 3.13 mg/dL — ABNORMAL HIGH (ref 0.44–1.00)
GFR, Estimated: 18 mL/min — ABNORMAL LOW (ref >60)
Glucose, Bld: 192 mg/dL — ABNORMAL HIGH (ref 70–99)
Potassium: 4.5 mmol/L (ref 3.5–5.1)
Sodium: 140 mmol/L (ref 135–145)

## 2021-12-02 LAB — POCT I-STAT 7, (LYTES, BLD GAS, ICA,H+H)
Acid-base deficit: 17 mmol/L — ABNORMAL HIGH (ref 0.0–2.0)
Bicarbonate: 8.6 mmol/L — ABNORMAL LOW (ref 20.0–28.0)
Calcium, Ion: 1.13 mmol/L — ABNORMAL LOW (ref 1.15–1.40)
HCT: 16 % — ABNORMAL LOW (ref 36.0–46.0)
Hemoglobin: 5.4 g/dL — CL (ref 12.0–15.0)
O2 Saturation: 99 %
Patient temperature: 98.3
Potassium: 5.3 mmol/L — ABNORMAL HIGH (ref 3.5–5.1)
Sodium: 137 mmol/L (ref 135–145)
TCO2: 9 mmol/L — ABNORMAL LOW (ref 22–32)
pCO2 arterial: 19.1 mmHg — CL (ref 32.0–48.0)
pH, Arterial: 7.263 — ABNORMAL LOW (ref 7.350–7.450)
pO2, Arterial: 153 mmHg — ABNORMAL HIGH (ref 83.0–108.0)

## 2021-12-02 LAB — BLOOD GAS, ARTERIAL
Acid-base deficit: 9.4 mmol/L — ABNORMAL HIGH (ref 0.0–2.0)
Bicarbonate: 14.6 mmol/L — ABNORMAL LOW (ref 20.0–28.0)
FIO2: 100
O2 Saturation: 99.9 %
Patient temperature: 36.5
pCO2 arterial: 24.3 mmHg — ABNORMAL LOW (ref 32.0–48.0)
pH, Arterial: 7.393 (ref 7.350–7.450)
pO2, Arterial: 438 mmHg — ABNORMAL HIGH (ref 83.0–108.0)

## 2021-12-02 LAB — GLUCOSE, CAPILLARY
Glucose-Capillary: 104 mg/dL — ABNORMAL HIGH (ref 70–99)
Glucose-Capillary: 75 mg/dL (ref 70–99)
Glucose-Capillary: 137 mg/dL — ABNORMAL HIGH (ref 70–99)
Glucose-Capillary: 147 mg/dL — ABNORMAL HIGH (ref 70–99)
Glucose-Capillary: 169 mg/dL — ABNORMAL HIGH (ref 70–99)
Glucose-Capillary: 180 mg/dL — ABNORMAL HIGH (ref 70–99)

## 2021-12-02 LAB — MRSA NEXT GEN BY PCR, NASAL: MRSA by PCR Next Gen: NOT DETECTED

## 2021-12-02 LAB — CK: Total CK: 138 U/L (ref 38–234)

## 2021-12-02 LAB — HCG, QUANTITATIVE, PREGNANCY: hCG, Beta Chain, Quant, S: <1 m[IU]/mL (ref <5)

## 2021-12-02 MED ORDER — DEXTROSE 50 % IV SOLN
1.0000 | Freq: Once | INTRAVENOUS | Status: AC
  Administered 2021-12-02: 08:00:00 50 mL via INTRAVENOUS
  Filled 2021-12-02: qty 50

## 2021-12-02 MED ORDER — ETOMIDATE 2 MG/ML IV SOLN
10.0000 mg | Freq: Once | INTRAVENOUS | Status: DC
Start: 2021-12-02 — End: 2021-12-02

## 2021-12-02 MED ORDER — SODIUM CHLORIDE 0.9 % IV SOLN
25.0000 mg | Freq: Once | INTRAVENOUS | Status: AC
  Administered 2021-12-02: 09:00:00 25 mg via INTRAVENOUS
  Filled 2021-12-02: qty 0.5

## 2021-12-02 MED ORDER — FENTANYL CITRATE (PF) 100 MCG/2ML IJ SOLN: 50.0000 ug | INTRAMUSCULAR | Status: DC | PRN

## 2021-12-02 MED ORDER — FENTANYL CITRATE (PF) 100 MCG/2ML IJ SOLN
INTRAMUSCULAR | Status: AC
  Filled 2021-12-02: qty 2

## 2021-12-02 MED ORDER — SODIUM CHLORIDE 0.9% FLUSH
10.0000 mL | Freq: Two times a day (BID) | INTRAVENOUS | Status: DC
Start: 2021-12-02 — End: 2021-12-05
  Administered 2021-12-02 – 2021-12-04 (×5): 10 mL

## 2021-12-02 MED ORDER — VASOPRESSIN 20 UNITS/100 ML INFUSION FOR SHOCK
0.0400 [IU]/min | INTRAVENOUS | Status: DC
  Administered 2021-12-02 – 2021-12-03 (×4): 0.04 [IU]/min via INTRAVENOUS
  Filled 2021-12-02 (×4): qty 100

## 2021-12-02 MED ORDER — SODIUM BICARBONATE 8.4 % IV SOLN: 100.0000 meq | Freq: Once | INTRAVENOUS | Status: AC

## 2021-12-02 MED ORDER — DIPHENHYDRAMINE HCL 50 MG/ML IJ SOLN
INTRAMUSCULAR | Status: DC | PRN
  Administered 2021-12-02: 50 mg via INTRAVENOUS

## 2021-12-02 MED ORDER — HEPARIN SODIUM (PORCINE) 1000 UNIT/ML IJ SOLN
INTRAMUSCULAR | Status: AC
  Filled 2021-12-02: qty 10

## 2021-12-02 MED ORDER — NITROGLYCERIN 1 MG/10 ML FOR IR/CATH LAB
INTRA_ARTERIAL | Status: AC
  Filled 2021-12-02: qty 10

## 2021-12-02 MED ORDER — ORAL CARE MOUTH RINSE
15.0000 mL | Freq: Two times a day (BID) | OROMUCOSAL | Status: DC
  Administered 2021-12-02: 09:00:00 15 mL via OROMUCOSAL

## 2021-12-02 MED ORDER — VERAPAMIL HCL 2.5 MG/ML IV SOLN
INTRAVENOUS | Status: AC
  Filled 2021-12-02: qty 2

## 2021-12-02 MED ORDER — FENTANYL CITRATE (PF) 100 MCG/2ML IJ SOLN
50.0000 ug | INTRAMUSCULAR | Status: DC | PRN
  Administered 2021-12-02 (×3): 100 ug via INTRAVENOUS
  Filled 2021-12-02 (×2): qty 2

## 2021-12-02 MED ORDER — ETOMIDATE 2 MG/ML IV SOLN
INTRAVENOUS | Status: AC
  Administered 2021-12-02: 10:00:00 20 mg via INTRAVENOUS
  Filled 2021-12-02: qty 20

## 2021-12-02 MED ORDER — SODIUM CHLORIDE 0.9 % IV SOLN
2.0000 g | Freq: Two times a day (BID) | INTRAVENOUS | Status: DC
  Administered 2021-12-02: 22:00:00 2 g via INTRAVENOUS
  Filled 2021-12-02: qty 2

## 2021-12-02 MED ORDER — CHLORHEXIDINE GLUCONATE 0.12% ORAL RINSE (MEDLINE KIT)
15.0000 mL | Freq: Two times a day (BID) | OROMUCOSAL | Status: DC
  Administered 2021-12-02 – 2021-12-04 (×4): 15 mL via OROMUCOSAL

## 2021-12-02 MED ORDER — MIDAZOLAM HCL 2 MG/2ML IJ SOLN
INTRAMUSCULAR | Status: AC
  Filled 2021-12-02: qty 2

## 2021-12-02 MED ORDER — LIDOCAINE HCL 1 % IJ SOLN
INTRAMUSCULAR | Status: AC
  Filled 2021-12-02: qty 20

## 2021-12-02 MED ORDER — FENTANYL CITRATE (PF) 100 MCG/2ML IJ SOLN
INTRAMUSCULAR | Status: AC
  Administered 2021-12-02: 12:00:00 100 ug
  Filled 2021-12-02: qty 2

## 2021-12-02 MED ORDER — SODIUM CHLORIDE 0.9% FLUSH: 10.0000 mL | INTRAVENOUS | Status: DC | PRN

## 2021-12-02 MED ORDER — DIPHENHYDRAMINE HCL 50 MG/ML IJ SOLN
INTRAMUSCULAR | Status: AC
  Filled 2021-12-02: qty 1

## 2021-12-02 MED ORDER — DOCUSATE SODIUM 50 MG/5ML PO LIQD: 100.0000 mg | Freq: Two times a day (BID) | ORAL | Status: DC | PRN

## 2021-12-02 MED ORDER — CARBAMAZEPINE 200 MG PO TABS
300.0000 mg | ORAL_TABLET | Freq: Every day | ORAL | Status: DC
  Administered 2021-12-02 – 2021-12-03 (×2): 300 mg
  Filled 2021-12-02 (×2): qty 1.5

## 2021-12-02 MED ORDER — PROPOFOL 1000 MG/100ML IV EMUL
5.0000 ug/kg/min | INTRAVENOUS | Status: DC
  Administered 2021-12-02 (×2): 20 ug/kg/min via INTRAVENOUS
  Administered 2021-12-03: 10 ug/kg/min via INTRAVENOUS
  Filled 2021-12-02 (×3): qty 100

## 2021-12-02 MED ORDER — VENLAFAXINE HCL 25 MG PO TABS
25.0000 mg | ORAL_TABLET | Freq: Three times a day (TID) | ORAL | Status: DC
  Administered 2021-12-02 – 2021-12-03 (×4): 25 mg
  Filled 2021-12-02 (×5): qty 1

## 2021-12-02 MED ORDER — ROCURONIUM BROMIDE 50 MG/5ML IV SOLN: 50.0000 mg | Freq: Once | INTRAVENOUS | Status: AC

## 2021-12-02 MED ORDER — IOHEXOL 300 MG/ML  SOLN
100.0000 mL | Freq: Once | INTRAMUSCULAR | Status: AC | PRN
  Administered 2021-12-02: 12:00:00 30 mL via INTRA_ARTERIAL

## 2021-12-02 MED ORDER — ETOMIDATE 2 MG/ML IV SOLN: 20.0000 mg | Freq: Once | INTRAVENOUS | Status: AC

## 2021-12-02 MED ORDER — INSULIN ASPART 100 UNIT/ML IV SOLN
10.0000 [IU] | Freq: Once | INTRAVENOUS | Status: AC
  Administered 2021-12-02: 08:00:00 10 [IU] via INTRAVENOUS

## 2021-12-02 MED ORDER — SODIUM ZIRCONIUM CYCLOSILICATE 5 G PO PACK
5.0000 g | PACK | Freq: Once | ORAL | Status: DC
  Filled 2021-12-02: qty 1

## 2021-12-02 MED ORDER — SODIUM BICARBONATE 8.4 % IV SOLN
INTRAVENOUS | Status: AC
  Administered 2021-12-02: 08:00:00 100 meq via INTRAVENOUS
  Filled 2021-12-02: qty 100

## 2021-12-02 MED ORDER — SODIUM BICARBONATE 8.4 % IV SOLN
INTRAVENOUS | Status: AC
  Filled 2021-12-02 (×2): qty 1000

## 2021-12-02 MED ORDER — SODIUM CHLORIDE 0.9 % IV SOLN: 1000.0000 mg | Freq: Once | INTRAVENOUS | Status: DC

## 2021-12-02 MED ORDER — IOHEXOL 300 MG/ML  SOLN
100.0000 mL | Freq: Once | INTRAMUSCULAR | Status: AC | PRN
  Administered 2021-12-02: 12:00:00 25 mL via INTRA_ARTERIAL

## 2021-12-02 MED ORDER — VENLAFAXINE HCL 25 MG PO TABS
25.0000 mg | ORAL_TABLET | Freq: Three times a day (TID) | ORAL | Status: DC
  Filled 2021-12-02 (×2): qty 1

## 2021-12-02 MED ORDER — SODIUM BICARBONATE 8.4 % IV SOLN
INTRAVENOUS | Status: AC
  Administered 2021-12-02: 10:00:00 100 meq via INTRAVENOUS
  Filled 2021-12-02: qty 100

## 2021-12-02 MED ORDER — DEXMEDETOMIDINE HCL IN NACL 400 MCG/100ML IV SOLN
0.0000 ug/kg/h | INTRAVENOUS | Status: DC
Start: 2021-12-02 — End: 2021-12-04
  Administered 2021-12-02: 13:00:00 0.4 ug/kg/h via INTRAVENOUS
  Administered 2021-12-02: 21:00:00 0.7 ug/kg/h via INTRAVENOUS
  Administered 2021-12-02: 16:00:00 1.2 ug/kg/h via INTRAVENOUS
  Administered 2021-12-02: 19:00:00 1 ug/kg/h via INTRAVENOUS
  Administered 2021-12-03 (×2): 1.2 ug/kg/h via INTRAVENOUS
  Administered 2021-12-03: 0.6 ug/kg/h via INTRAVENOUS
  Administered 2021-12-03 (×3): 1.2 ug/kg/h via INTRAVENOUS
  Administered 2021-12-03: 1 ug/kg/h via INTRAVENOUS
  Administered 2021-12-03: 0.6 ug/kg/h via INTRAVENOUS
  Administered 2021-12-04 (×3): 1.2 ug/kg/h via INTRAVENOUS
  Filled 2021-12-02 (×16): qty 100

## 2021-12-02 MED ORDER — POLYETHYLENE GLYCOL 3350 17 G PO PACK: 17.0000 g | PACK | Freq: Every day | ORAL | Status: DC | PRN

## 2021-12-02 MED ORDER — ROCURONIUM BROMIDE 10 MG/ML (PF) SYRINGE
PREFILLED_SYRINGE | INTRAVENOUS | Status: AC
  Administered 2021-12-02: 10:00:00 50 mg via INTRAVENOUS
  Filled 2021-12-02: qty 10

## 2021-12-02 MED ORDER — NOREPINEPHRINE 4 MG/250ML-% IV SOLN
0.0000 ug/min | INTRAVENOUS | Status: DC
Start: 2021-12-02 — End: 2021-12-03
  Administered 2021-12-02: 19:00:00 19 ug/min via INTRAVENOUS
  Administered 2021-12-02: 10:00:00 35 ug/min via INTRAVENOUS
  Administered 2021-12-02: 15:00:00 15 ug/min via INTRAVENOUS
  Administered 2021-12-02: 22:00:00 21 ug/min via INTRAVENOUS
  Administered 2021-12-03: 20 ug/min via INTRAVENOUS
  Administered 2021-12-03: 18 ug/min via INTRAVENOUS
  Administered 2021-12-03: 21 ug/min via INTRAVENOUS
  Administered 2021-12-03: 10 ug/min via INTRAVENOUS
  Filled 2021-12-02 (×8): qty 250

## 2021-12-02 MED ORDER — SODIUM CHLORIDE 0.9 % IV SOLN
1000.0000 mg | Freq: Once | INTRAVENOUS | Status: AC
  Administered 2021-12-02: 14:00:00 1000 mg via INTRAVENOUS
  Filled 2021-12-02 (×2): qty 20

## 2021-12-02 MED ORDER — NOREPINEPHRINE 4 MG/250ML-% IV SOLN
INTRAVENOUS | Status: AC
  Administered 2021-12-02: 07:00:00 4 ug/min via INTRAVENOUS
  Filled 2021-12-02: qty 250

## 2021-12-02 MED ORDER — ORAL CARE MOUTH RINSE
15.0000 mL | OROMUCOSAL | Status: DC
  Administered 2021-12-02 – 2021-12-04 (×21): 15 mL via OROMUCOSAL

## 2021-12-02 NOTE — Progress Notes (Addendum)
NAME:  Jane Edwards, MRN:  [id number], DOB:  20-Jul-1976, LOS: 1 ADMISSION DATE:  11/20/2021, CONSULTATION DATE:  12/13 REFERRING MD:  Eulis Foster, CHIEF COMPLAINT:  weakness/sepsis   History of Present Illness:  Jane Edwards is a 45 yo female with past medical history as stated below. She had recanalization and stent placement for SVC occlusion by IR on 11/24/21.  On 12/13, the patient sustained a mechanical fall and then developed weakness, and soreness in Rt groin area with an expanding bruise. She presented to the ED and began to develop waxing/waning mental status in ER with episodes of nausea and vomiting.  She was started on ABx for possible sepsis. Found to have worsening anemia with low BP and PCCM asked to assess for admission to ICU. Of note, patient is a Jehovah's witness and declines all blood products.   Pertinent  Medical History  HTN, DM type 2, Seizures, Iron deficiency with q34month iron transfusion, Menorrhagia, Poor venous access with prior indwelling subclavian vein ports, Uterine embolization, CHF, Chronic fatigue, Chronic headaches, Depression with anxiety, HTN, HLD, OSA, PTSD  Significant Hospital Events: Including procedures, antibiotic start and stop dates in addition to other pertinent events   12/13 admitted to ICU, vascular surg consulted, started on abx for sepsis 12/14 Hb 5.0- patient is Jehovah's witness and declines all blood products  Interim History / Subjective:  Patient is somnolent upon evaluation. Previously noted to be alert and oriented to self, however, a&o x0 this morning.   Objective   Blood pressure 101/87, pulse 96, temperature (!) 97.3 F (36.3 C), temperature source Axillary, resp. rate (!) 25, height 5\' 1"  (1.549 m), SpO2 100 %.        Intake/Output Summary (Last 24 hours) at 12/02/2021 4174 Last data filed at 12/02/2021 0000 Gross per 24 hour  Intake 1064.28 ml  Output --  Net 1064.28 ml   There were no vitals filed for this  visit.  Examination: General: Somnolent, NAD. HENT: [place], AT. EOMI Lungs: CTAB, no wheezes or crackles Cardiovascular: RRR, no murmurs appreciated Abdomen: soft, nontender, +BS. Hematoma in R groin  Extremities: 2+ edema in bilateral lower extremities. L fem central line Neuro: Awake, unable to converse much. A&O x0  Resolved Hospital Problem list     Assessment & Plan:  Sepsis likely secondary to R groin hematoma/infection Lactic acidosis Altered mental status secondary to above CT abd/pelvis with multiple hyperdense collections within subcutaneous fat of RLQ, right flank, and right groin consistent with hematomas with fluid collections in R groin suspicious for active bleeding. No evidence of retroperitoneal hematoma.  - Vascular surgery consulted - Continue vanc and cefepime - Follow blood cultures  Hypotension  Secondary to above.  - Continue IV fluids: NS 75 cc/hr - Start levophed - Maintain MAP >65  Acute blood loss anemia Hb noted to be 12.2 2 days ago and has been downtrending. Most recent Hb 5.0 this morning. 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 Patient is developing multiorgan failure from acute blood loss.  - IV iron dextran 1000 mg infusion - Monitor CBC  Metabolic acidosis pH 0.814, pCO2 19, bicarb 8.6. May end up requiring mechanical ventilation.  - 100 mEq bicarb push   Acute kidney injury Baseline sCr ~0.9, elevated to 2.68 today. Acute renal failure likely secondary to profound acute blood loss anemia. Patient is oliguric. - Avoid nephrotoxins - Monitor BMP - IVF as above - Consider nephro consult/CRRT  Hyperkalemia K 5.4 this morning. Patient  is altered and unable to take po meds.  - IV insulin/glucose  - Follow up BMP   Best Practice (right click and "Reselect all SmartList Selections" daily)   Diet/type: NPO DVT prophylaxis: not indicated GI prophylaxis: N/A Lines: Central line Foley:  Yes, and it is still  needed Code Status:  full code Last date of multidisciplinary goals of care discussion [updated mother 12/14]  Labs   CBC: Recent Labs  Lab 11/30/21 0000 11/29/2021 1300 11/19/2021 1330 11/19/2021 1332 11/23/2021 1926 12/02/21 0219 12/02/21 0251  WBC 5.6 16.8*  --   --   --  32.2*  --   NEUTROABS 4.20 13.8*  --   --   --   --   --   HGB 12.2 7.9* 9.2* 9.2* 5.4* 5.0* 5.4*  HCT 37 26.2* 27.0* 27.0* 16.0* 16.1* 16.0*  MCV  --  92.9  --   --   --  91.0  --   PLT 161 278  --   --   --  308  --     Basic Metabolic Panel: Recent Labs  Lab 11/27/2021 1300 12/03/2021 1330 12/08/2021 1332 12/16/2021 1926 12/02/21 0219 12/02/21 0251  NA 138 138 137 139 134* 137  K 4.5 4.5 4.5 4.5 5.4* 5.3*  CL 108  --  108  --  107  --   CO2 16*  --   --   --  <7*  --   GLUCOSE 173*  --  169*  --  121*  --   BUN 15  --  15  --  21*  --   CREATININE 1.72*  --  1.60*  --  2.68*  --   CALCIUM 8.8*  --   --   --  7.8*  --    GFR: Estimated Creatinine Clearance: 35.4 mL/min (A) (by C-G formula based on SCr of 2.68 mg/dL (H)). Recent Labs  Lab 11/30/21 0000 12/02/2021 1300 12/02/21 0219  WBC 5.6 16.8* 32.2*  LATICACIDVEN  --  6.1*  --     Liver Function Tests: Recent Labs  Lab 11/28/2021 1300 12/02/21 0219  AST 47* 240*  ALT 47* 144*  ALKPHOS 73 65  BILITOT 1.0 0.9  PROT 5.7* 4.9*  ALBUMIN 3.0* 2.4*   Recent Labs  Lab 12/18/2021 1300  LIPASE 21   No results for input(s): AMMONIA in the last 168 hours.  ABG    Component Value Date/Time   PHART 7.263 (L) 12/02/2021 0251   PCO2ART 19.1 (LL) 12/02/2021 0251   PO2ART 153 (H) 12/02/2021 0251   HCO3 8.6 (L) 12/02/2021 0251   TCO2 9 (L) 12/02/2021 0251   ACIDBASEDEF 17.0 (H) 12/02/2021 0251   O2SAT 99.0 12/02/2021 0251     Coagulation Profile: No results for input(s): INR, PROTIME in the last 168 hours.  Cardiac Enzymes: No results for input(s): CKTOTAL, CKMB, CKMBINDEX, TROPONINI in the last 168 hours.  HbA1C: No results found for:  HGBA1C  CBG: Recent Labs  Lab 11/25/21 0744 11/25/21 1154 11/24/2021 2308 12/02/21 0300  GLUCAP 103* 182* 104* 104*    Review of Systems:     Critical care time: 40 minutes

## 2021-12-02 NOTE — Progress Notes (Signed)
EEG completed, results pending. 

## 2021-12-02 NOTE — Progress Notes (Signed)
[facility] Progress Note Patient Name: Jane Edwards DOB: Dec 30, 1975 MRN: [id number]   Date of Service  12/02/2021  HPI/Events of Note  Per bedside RN patient was tachypneic and restless earlier causing RN to get concerned but patient is now asleep and appears to be resting comfortably, + moderate tachypnea but  BP is within normal limits and when pulse oximeter tracks her saturation is 99 % on 3 liters of oxygen via nasal cannula.  eICU Interventions  Will check ABG and H & H.        Jane Edwards [name] 12/02/2021, 2:25 AM

## 2021-12-02 NOTE — Progress Notes (Signed)
° °  VASCULAR SURGERY ASSESSMENT & PLAN:   RIGHT GROIN HEMATOMA: Her right groin hematoma is softer today.  Her CT scan shows several areas of hematoma.  Again I have no plans for surgery.  Skin breakdown.  Vascular surgery will continue to follow for now.  ANEMIA: Her hemoglobin is 5.4 this morning.  She is a Restaurant manager, fast food.  SUBJECTIVE:   No specific complaints this morning.  PHYSICAL EXAM:   Vitals:   12/02/21 0345 12/02/21 0515 12/02/21 0530 12/02/21 0545  BP:      Pulse: (!) 105   96  Resp: (!) 24 (!) 26 (!) 30 (!) 25  Temp:      TempSrc:      SpO2: 100%   100%  Height:       Right groin hematoma is softer.  LABS:   Lab Results  Component Value Date   WBC 32.2 (H) 12/02/2021   HGB 5.4 (LL) 12/02/2021   HCT 16.0 (L) 12/02/2021   MCV 91.0 12/02/2021   PLT 308 12/02/2021   Lab Results  Component Value Date   CREATININE 2.68 (H) 12/02/2021   Lab Results  Component Value Date   INR 1.0 11/23/2021   CBG (last 3)  Recent Labs    12/13/2021 2308 12/02/21 0300  GLUCAP 104* 104*    PROBLEM LIST:    Principal Problem:   Shock ([place])   CURRENT MEDS:    carbamazepine  300 mg Oral Daily   Chlorhexidine Gluconate Cloth  6 each Topical Daily   diphenhydrAMINE  50 mg Oral Once   Or   diphenhydrAMINE  50 mg Intravenous Once   sodium chloride flush  10-40 mL Intracatheter Q12H   sodium zirconium cyclosilicate  5 g Oral Once   vancomycin variable dose per unstable renal function (pharmacist dosing)   Does not apply See admin instructions   venlafaxine XR  75 mg Oral Q breakfast    Deitra Mayo Office: [phone] 12/02/2021

## 2021-12-02 NOTE — H&P (Signed)
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 then removed without complication. The patient tolerated procedure well was transferred to the post procedural area in good condition. IMPRESSION: Technically successful bilateral uterine artery embolization. Ruthann Cancer, MD [facility] Radiology Electronically Signed   By: Ruthann Cancer M.D.   On: 11/04/2021 14:53   IR Angiogram Selective Each Additional Vessel  Result Date: 11/04/2021 INDICATION: 45 year old female with history of uterine fibroids and menorrhagia resulting and chronic iron deficiency anemia requiring iron transfusions. EXAM: 1. Ultrasound-guided vascular access of the left radial artery. 2. Pelvic angiogram. 3. Selective catheterization of bilateral uterine arteries. 4. Particle embolization of the bilateral uterine arteries. MEDICATIONS: Vancomycin 1.5 IV. The antibiotic was administered within 1 hour of the procedure. 8 mg Zofran, intravenous. 1 g intravenous Tylenol. ANESTHESIA/SEDATION: Moderate (conscious) sedation was employed during this procedure. A total of Versed  0.5 mg and Fentanyl 50 mcg was administered intravenously. Moderate Sedation Time: 82 minutes. The patient's level of consciousness and vital signs were monitored continuously by radiology nursing throughout the procedure under my direct supervision. CONTRAST:  46m OMNIPAQUE IOHEXOL 300 MG/ML SOLN, 511mOMNIPAQUE IOHEXOL 300 MG/ML SOLN FLUOROSCOPY TIME:  Fluoroscopy Time: 20 minutes 42 seconds (1,812 mGy). COMPLICATIONS: None immediate. PROCEDURE: Informed consent was obtained from the patient following explanation of the procedure, risks, benefits and alternatives. The patient understands, agrees and consents for the procedure. All questions were addressed. A time out was performed prior to the initiation of the procedure. Maximal barrier sterile technique utilized including caps, mask, sterile gowns, sterile gloves, large sterile  drape, hand hygiene, and Betadine prep. The left wrist was prepped and draped in standard fashion. Nickolas Madrid test was "A". Subdermal Local anesthesia was provided at the planned needle entry site. Under direct ultrasound visualization, the left radial artery was punctured with a 4 cm micropuncture needle. A permanent image was captured and stored. A micropuncture wire was inserted without difficulty. Additional local anesthetic was administered and a small skin nick was made. The needle was removed and a 4/5 French slender sheath was inserted. A radial cocktail was slowly administered. Through the sheath, a 5 French MG2 Glide catheter was inserted over a Bentson wire under direct fluoroscopic guidance from the left upper extremity to the abdominal aorta. The right common and internal iliac arteries were selected. Right internal iliac angiogram was then performed which demonstrated a patent uterine artery arising from the anterior division. A Renegade high flow and 0.016 fathom wire were used to select the right uterine artery. The horizontal portion of the uterine artery was catheterized  and angiogram was performed. The right hemi uterus was visualized with at least 1 hyperenhancing mass compatible with a uterine fibroid. There was no evidence of vesicular, rectal, or vaginal branches. Embolization was then performed with 500-700 micron embospheres. After injection of approximately 1 vial, there was noted to be sluggish flow in the uterine artery. 3 mL of preservative-free lidocaine were then injected intra-arterially. The microcatheter was then removed. The 5 French catheter was then retracted and used to select the left common and internal iliac artery. Left internal iliac angiogram demonstrated a patent uterine artery arising from the anterior division. A Renegade high flow and 0.016 fathom wire were used to select the left uterine artery. The horizontal portion of the uterine artery was catheterized and angiogram was performed. The left hemi uterus was visualized with at least 1 hyperenhancing mass compatible with a uterine fibroid. There was no evidence of vesicular, rectal, or vaginal branches. Embolization was then performed with 500-700 micron embospheres. After injection of approximately 1 vial, there was noted to be sluggish flow in the uterine artery. 3 mL of preservative-free lidocaine were then injected intra-arterially. The microcatheter was then removed. The 5 French catheter was then retracted to the abdominal aorta. Pelvic angiogram was then performed which demonstrated widely patent bilateral inflow vessels with relative pruning of the previously visualized bilateral uterine arteries. The catheter was then removed. A TR band was applied to the left radial sheath which was then removed without complication. The patient tolerated procedure well was transferred to the post procedural area in good condition. IMPRESSION: Technically successful bilateral uterine artery embolization. Ruthann Cancer, MD [facility] Radiology Electronically  Signed   By: Ruthann Cancer M.D.   On: 11/04/2021 14:53   IR Angiogram Selective Each Additional Vessel  Result Date: 11/04/2021 INDICATION: 45 year old female with history of uterine fibroids and menorrhagia resulting and chronic iron deficiency anemia requiring iron transfusions. EXAM: 1. Ultrasound-guided vascular access of the left radial artery. 2. Pelvic angiogram. 3. Selective catheterization of bilateral uterine arteries. 4. Particle embolization of the bilateral uterine arteries. MEDICATIONS: Vancomycin 1.5 IV. The antibiotic was administered within 1 hour of the procedure. 8 mg Zofran, intravenous. 1 g intravenous Tylenol. ANESTHESIA/SEDATION: Moderate (conscious) sedation was employed during this procedure. A total of Versed 0.5 mg and Fentanyl 50 mcg was administered intravenously. Moderate Sedation Time: 82 minutes. The patient's level of consciousness and vital signs were monitored continuously by radiology nursing throughout the procedure under my direct supervision. CONTRAST:  43m OMNIPAQUE IOHEXOL 300 MG/ML SOLN, 552mOMNIPAQUE IOHEXOL 300 MG/ML SOLN FLUOROSCOPY TIME:  Fluoroscopy Time: 20 minutes 42 seconds (1,812 mGy). COMPLICATIONS: None immediate. PROCEDURE: Informed consent was obtained from the patient following explanation of the procedure, risks, benefits and alternatives. The patient understands, agrees and consents for the procedure. All questions were addressed. A time out was performed prior to the initiation of the procedure. Maximal barrier sterile technique utilized including caps, mask, sterile gowns, sterile gloves, large sterile drape, hand hygiene, and Betadine prep. The left wrist was prepped and draped in standard fashion. BaNickolas Madridest was "A". Subdermal Local anesthesia was provided at the planned needle entry site. Under direct ultrasound visualization, the left radial artery was punctured with a 4 cm micropuncture needle. A permanent image was captured and stored. A  micropuncture wire was inserted without difficulty. Additional local anesthetic was administered and a small skin nick was made. The needle was removed and a 4/5 French slender sheath was inserted. A radial cocktail was slowly administered. Through the sheath, a 5 French MG2 Glide catheter was inserted over a Bentson wire under direct fluoroscopic guidance from the left upper extremity to the abdominal aorta. The right common and internal iliac arteries were selected. Right internal iliac angiogram was then performed which demonstrated a patent uterine artery arising from the anterior division. A Renegade high flow and 0.016 fathom wire were used to select the right uterine artery. The horizontal portion of the uterine artery was catheterized and angiogram was performed. The right hemi uterus was visualized with at least 1 hyperenhancing mass compatible with a uterine fibroid. There was no evidence of vesicular, rectal, or vaginal branches. Embolization was then performed with 500-700 micron embospheres. After injection of approximately 1 vial, there was noted to be sluggish flow in the uterine artery. 3 mL of preservative-free lidocaine were then injected intra-arterially. The microcatheter was then removed. The 5 French catheter was then retracted and used to select the left common and internal iliac artery. Left internal iliac angiogram demonstrated a patent uterine artery arising from the anterior division. A Renegade high flow and 0.016 fathom wire were used to select the left uterine artery. The horizontal portion of the uterine artery was catheterized and angiogram was performed. The left hemi uterus was visualized with at least 1 hyperenhancing mass compatible with a uterine fibroid. There was no evidence of vesicular, rectal, or vaginal branches. Embolization was then performed with 500-700 micron embospheres. After injection of approximately 1 vial, there was noted to be sluggish flow in the uterine  artery. 3 mL of preservative-free lidocaine were then injected intra-arterially. The microcatheter was then removed. The 5 French catheter was then retracted to the abdominal aorta. Pelvic angiogram was then performed which demonstrated widely patent bilateral inflow vessels with relative pruning of the previously visualized bilateral uterine arteries. The catheter was then removed. A TR band was applied to the left radial sheath which was then removed without complication. The patient tolerated procedure well was transferred to the post procedural area in good condition. IMPRESSION: Technically successful bilateral uterine artery embolization. DyRuthann CancerMD [facility] Electronically Signed   By: DyRuthann Cancer.D.   On: 11/04/2021 14:53   IR USKoreauide Vasc Access Left  Result Date: 11/04/2021 INDICATION: [age]ear old female with history of uterine fibroids and menorrhagia resulting and chronic iron deficiency anemia requiring iron transfusions. EXAM: 1. Ultrasound-guided vascular access of the left radial artery. 2. Pelvic angiogram. 3. Selective catheterization of  bilateral uterine arteries. 4. Particle embolization of the bilateral uterine arteries. MEDICATIONS: Vancomycin 1.5 IV. The antibiotic was administered within 1 hour of the procedure. 8 mg Zofran, intravenous. 1 g intravenous Tylenol. ANESTHESIA/SEDATION: Moderate (conscious) sedation was employed during this procedure. A total of Versed 0.5 mg and Fentanyl 50 mcg was administered intravenously. Moderate Sedation Time: 82 minutes. The patient's level of consciousness and vital signs were monitored continuously by radiology nursing throughout the procedure under my direct supervision. CONTRAST:  52m OMNIPAQUE IOHEXOL 300 MG/ML SOLN, 585mOMNIPAQUE IOHEXOL 300 MG/ML SOLN FLUOROSCOPY TIME:  Fluoroscopy Time: 20 minutes 42 seconds (1,812 mGy). COMPLICATIONS: None immediate. PROCEDURE:  Informed consent was obtained from the patient following explanation of the procedure, risks, benefits and alternatives. The patient understands, agrees and consents for the procedure. All questions were addressed. A time out was performed prior to the initiation of the procedure. Maximal barrier sterile technique utilized including caps, mask, sterile gowns, sterile gloves, large sterile drape, hand hygiene, and Betadine prep. The left wrist was prepped and draped in standard fashion. BaNickolas Madridest was "A". Subdermal Local anesthesia was provided at the planned needle entry site. Under direct ultrasound visualization, the left radial artery was punctured with a 4 cm micropuncture needle. A permanent image was captured and stored. A micropuncture wire was inserted without difficulty. Additional local anesthetic was administered and a small skin nick was made. The needle was removed and a 4/5 French slender sheath was inserted. A radial cocktail was slowly administered. Through the sheath, a 5 French MG2 Glide catheter was inserted over a Bentson wire under direct fluoroscopic guidance from the left upper extremity to the abdominal aorta. The right common and internal iliac arteries were selected. Right internal iliac angiogram was then performed which demonstrated a patent uterine artery arising from the anterior division. A Renegade high flow and 0.016 fathom wire were used to select the right uterine artery. The horizontal portion of the uterine artery was catheterized and angiogram was performed. The right hemi uterus was visualized with at least 1 hyperenhancing mass compatible with a uterine fibroid. There was no evidence of vesicular, rectal, or vaginal branches. Embolization was then performed with 500-700 micron embospheres. After injection of approximately 1 vial, there was noted to be sluggish flow in the uterine artery. 3 mL of preservative-free lidocaine were then injected intra-arterially. The  microcatheter was then removed. The 5 French catheter was then retracted and used to select the left common and internal iliac artery. Left internal iliac angiogram demonstrated a patent uterine artery arising from the anterior division. A Renegade high flow and 0.016 fathom wire were used to select the left uterine artery. The horizontal portion of the uterine artery was catheterized and angiogram was performed. The left hemi uterus was visualized with at least 1 hyperenhancing mass compatible with a uterine fibroid. There was no evidence of vesicular, rectal, or vaginal branches. Embolization was then performed with 500-700 micron embospheres. After injection of approximately 1 vial, there was noted to be sluggish flow in the uterine artery. 3 mL of preservative-free lidocaine were then injected intra-arterially. The microcatheter was then removed. The 5 French catheter was then retracted to the abdominal aorta. Pelvic angiogram was then performed which demonstrated widely patent bilateral inflow vessels with relative pruning of the previously visualized bilateral uterine arteries. The catheter was then removed. A TR band was applied to the left radial sheath which was then removed without complication. The patient tolerated procedure well was transferred to the post procedural  area in good condition. IMPRESSION: Technically successful bilateral uterine artery embolization. Ruthann Cancer, MD [facility] Radiology Electronically Signed   By: Ruthann Cancer M.D.   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 Status post unsuccessful catheter and wire  central venous recanalization on 09/08/2021. She presents today for sharp recanalization due to persistent SVC syndrome symptoms. EXAM: 1. Ultrasound-guided vascular access of the right common femoral vein, right brachial vein, and right internal jugular vein. 2. Right upper extremity and central venography. 3. Sharp recanalization of the right innominate vein. 4. Balloon angioplasty of the right axillary vein, subclavian vein, right innominate vein, and superior vena cava. 5. Right subclavian vein to superior vena cava stent placement. 6. Intravascular ultrasound. COMPARISON:  08/21/2021, 09/08/2021 MEDICATIONS: None. ANESTHESIA/SEDATION: The procedure was performed under general anesthesia. FLUOROSCOPY TIME:  Fluoroscopy Time: 58 minutes 16 seconds (1,881 mGy). CONTRAST:  150 mL Omnipaque 300, intravenous COMPLICATIONS: None immediate. TECHNIQUE: Informed written consent was obtained from the patient after a thorough discussion of the procedural risks, benefits and alternatives. All questions were addressed. Maximal Sterile Barrier Technique was utilized including caps, mask, sterile gowns, sterile gloves, sterile drape, hand hygiene and skin antiseptic. A timeout was performed prior to the initiation of the procedure. The right groin, right upper extremity, and right neck were prepped and draped in standard fashion. The right upper extremity was then examined which demonstrated a patent right brachial vein. A small skin nick was made at the planned needle entry site. Under direct ultrasound visualization, a 21 gauge micropuncture needle was directed into the right brachial vein. An image was captured and stored in a permanent record. A Micropuncture sheath was placed. A Wholey wire was directed to the level of the subclavian vein. The micropuncture sheath was exchanged for a 6 French, 35 cm vascular sheath. A 5 [place] Kumpe the catheter was inserted to gain more central access in the subclavian vein. Limited  right upper extremity venogram demonstrated irregular appearance of the central right subclavian vein with stenotic but patent communication with the central right internal jugular vein. There is no antegrade flow through the innominate vein. There are multiple cervical and right chest wall collateral veins present. Ultrasound evaluation demonstrated patency of the right common femoral vein. A small skin nick was made at the planned needle entry site. Under direct ultrasound visualization, a 21 gauge micropuncture needle was directed into the right common femoral vein. An image was captured and stored the permanent record. A micropuncture sheath was placed. A Wholey wire was then directed to the inferior vena cava under fluoroscopic guidance and the micro puncture sheath was exchanged for a 12 French, 35 cm vascular sheath. Subsequently, in coaxial fashion, a 7 [place], 65 cm sheath was introduced over the wire into the superior vena cava. Simultaneous right upper extremity and central venogram was performed which demonstrated complete occlusion of the right innominate vein extending into the peripheral superior vena cava. The mid to central superior vena cava and azygous veins are patent. Given the patency of the internal jugular vein, right internal jugular vein access was not pursued. Ultrasound evaluation demonstrated patency of the right internal jugular vein. A small skin nick was made at the planned needle  entry site. Under direct ultrasound visualization, a 21 gauge micropuncture needle was directed into the right internal jugular vein. An image was captured and stored in the permanent record. A micropuncture sheath was placed. A Wholey wire was then directed into the internal jugular vein under fluoroscopic guidance and a micropuncture sheath was exchanged for a 6 [place], 10 cm vascular sheath. A 5 French angled tip catheter was then directed to the central aspect of the right internal jugular vein at the  confluence with the subclavian vein, just above the occluded innominate vein. Venogram was performed which demonstrated multiple cervical and chest wall collaterals with several patent collaterals with the subclavian vein. From the inferior approach, an angled tip 5 French catheter was directed to the apex of the occluded superior vena cava. A stiff Glidewire was then used to attempt to catheter and wire recanalization of the innominate vein which was unsuccessful. Therefore, from the right arm approach, a 7 mm x 20 mm mustang balloon was inserted and positioned at the level of the jugular and subclavian confluence and inflated. From the right groin, in 89 cm BRK needle was advanced over fluoroscopic guidance with multiple live fluoro obliquities to target the balloon. Given challenge targeting the 7 mm balloon. This was exchanged for a 10 mm by 4 cm mustang balloon which was inflated in similar position. The BRK needle was advanced and tented the medial aspect of the balloon with inability to rupture/enter. Therefore, the inner stylet of the [place] needle was removed and a V18 wire was inserted which formed around the peripheral aspect of the balloon. The balloon was deflated and removed. Through the right arm access, a 15 mm loop snare was then inserted and the V18 wire was captured in the right internal jugular vein. The wire was then pulled through the right arm sheath the great through and through, flossing access. A quick cross 135 cm catheter was then inserted from the right groin sheath over the wire, exiting the right arm sheath. The wire was removed and exchanged for 0.014 inch [place] wire. From the groin approach, a 4 mm x 150 mm coyote balloon was inserted and balloon angioplasty was performed about the recanalized right innominate vein. There was a high-grade focal stenosis just inferior to the right clavicle. Venogram was performed from the right arm sheath after removal of the balloon which  demonstrated occlusion of the right axillary and right subclavian veins. A 125 cm, 5 French angled tip catheter was then inserted over the wire from the right groin approach exiting right upper extremity sheath. A 0.014 inch wire was removed and exchanged for a 0.035 inch stiff exchange Glidewire. Next, balloon angioplasty was performed with a 10 mm x 4 cm mustang balloon in multiple stations from the level of the innominate vein through the right axillary vein. Intravascular ultrasound was then performed through the right heart and into the right axillary vein. 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 Sizing in length was demarcated with fluoroscopic reference using the IVUS catheter. Next, a 14 mm x 80 mm Abre self expanding stent was deployed under fluoroscopic guidance from the right subclavian vein through the superior aspect of the superior vena cava. Post balloon molding was performed with a 14 mm x 4 cm atlas balloon. Venogram demonstrated persistent multifocal occlusion/high-grade stenosis in the right axillary and  peripheral right subclavian vein. There are multiple filling defects within the otherwise patent stent. Additional balloon angioplasty was performed of the right axillary and right subclavian veins as well as through the indwelling stent. Right upper extremity venogram demonstrated improved patency inflow with multiple filling defects remaining throughout the length of the stent, particularly at the level of the first rib/clavicle. Therefore, additional post balloon molding with a 14 mm x 4 cm atlas balloon was performed. Completion venogram from the right upper extremity demonstrated brisk antegrade flow through the right subclavian vein, indwelling stent, and into the superior vena cava. There  are persistent moderate filling defects about the level of first rib/clavicle, which were recalcitrant to aggressive balloon molding. Intravascular ultrasound was repeated throughout the length of the stent and right subclavian vein which demonstrated significantly improved patency in excellent proximal distal wall apposition with multiple intraluminal echogenic irregularities through the midportion of the stent. Attempt at venogram from the right IJ was a made, however the indwelling sheath and catheter had backed out of the intraluminal position and were positioned in the right neck soft tissues. This sheath, along with right upper extremity right groin sheaths were removed and hemostasis was achieved with manual compression. Sterile bandages were applied. The patient was administered 1 milligram/kilogram Lovenox injection prior to leaving the IR suite. The patient was extubated and transferred to the postanesthesia care unit in stable condition. IMPRESSION: 1. 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 2. Technically successful sharp recanalization of the right innominate vein. 3. Right subclavian to superior vena cava self expanding stent placement (14 x 80 mm Abre). PLAN: After extensive conversation with Pharmacy regarding Ms. Christy' drug drug interactions, decision was made to initiate therapeutic Lovenox for 1 month and indefinite Plavix. Plan for follow-up CT chest in 1 month with concomitant clinic appointment. Ruthann Cancer, MD [facility] Radiology Electronically Signed   By: Ruthann Cancer M.D.   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 Status post unsuccessful catheter and wire central venous recanalization on 09/08/2021. She presents today for sharp recanalization due to persistent SVC syndrome symptoms. EXAM: 1. Ultrasound-guided vascular access of the right common femoral vein, right brachial vein, and right internal jugular vein. 2. Right upper extremity and central venography. 3. Sharp recanalization of the right innominate vein. 4. Balloon angioplasty of the right axillary vein, subclavian vein, right innominate vein, and superior vena cava. 5. Right subclavian vein to superior vena cava stent placement. 6. Intravascular ultrasound. COMPARISON:  08/21/2021, 09/08/2021 MEDICATIONS: None. ANESTHESIA/SEDATION: The procedure was performed under general anesthesia. FLUOROSCOPY TIME:  Fluoroscopy Time: 58 minutes 16 seconds (1,881 mGy). CONTRAST:  150 mL Omnipaque 300, intravenous COMPLICATIONS: None immediate. TECHNIQUE: Informed written consent was obtained from the patient after a thorough discussion of the procedural risks, benefits and alternatives. All questions were addressed. Maximal Sterile Barrier Technique was utilized including caps, mask, sterile gowns, sterile gloves, sterile drape, hand hygiene and skin antiseptic. A timeout was performed prior to the initiation of the procedure. The right groin, right upper extremity, and right neck were prepped and draped in standard fashion. The right upper extremity was then examined which demonstrated a patent right brachial vein. A small skin nick was made  at the planned needle entry site. Under direct ultrasound visualization, a 21 gauge micropuncture needle was directed into the right brachial vein. An image was captured and stored in a permanent record. A Micropuncture sheath was placed. A Wholey wire was directed to the level of the subclavian vein. The micropuncture sheath was exchanged for a 6 French, 35 cm vascular sheath. A 5 [place] Kumpe the catheter was inserted to gain  more central access in the subclavian vein. Limited right upper extremity venogram demonstrated irregular appearance of the central right subclavian vein with stenotic but patent communication with the central right internal jugular vein. There is no antegrade flow through the innominate vein. There are multiple cervical and right chest wall collateral veins present. Ultrasound evaluation demonstrated patency of the right common femoral vein. A small skin nick was made at the planned needle entry site. Under direct ultrasound visualization, a 21 gauge micropuncture needle was directed into the right common femoral vein. An image was captured and stored the permanent record. A micropuncture sheath was placed. A Wholey wire was then directed to the inferior vena cava under fluoroscopic guidance and the micro puncture sheath was exchanged for a 12 French, 35 cm vascular sheath. Subsequently, in coaxial fashion, a 7 [place], 65 cm sheath was introduced over the wire into the superior vena cava. Simultaneous right upper extremity and central venogram was performed which demonstrated complete occlusion of the right innominate vein extending into the peripheral superior vena cava. The mid to central superior vena cava and azygous veins are patent. Given the patency of the internal jugular vein, right internal jugular vein access was not pursued. Ultrasound evaluation demonstrated patency of the right internal jugular vein. A small skin nick was made at the planned needle entry site. Under direct ultrasound visualization, a 21 gauge micropuncture needle was directed into the right internal jugular vein. An image was captured and stored in the permanent record. A micropuncture sheath was placed. A Wholey wire was then directed into the internal jugular vein under fluoroscopic guidance and a micropuncture sheath was exchanged for a 6 [place], 10 cm vascular sheath. A 5 French angled tip catheter was then directed to the central  aspect of the right internal jugular vein at the confluence with the subclavian vein, just above the occluded innominate vein. Venogram was performed which demonstrated multiple cervical and chest wall collaterals with several patent collaterals with the subclavian vein. From the inferior approach, an angled tip 5 French catheter was directed to the apex of the occluded superior vena cava. A stiff Glidewire was then used to attempt to catheter and wire recanalization of the innominate vein which was unsuccessful. Therefore, from the right arm approach, a 7 mm x 20 mm mustang balloon was inserted and positioned at the level of the jugular and subclavian confluence and inflated. From the right groin, in 89 cm BRK needle was advanced over fluoroscopic guidance with multiple live fluoro obliquities to target the balloon. Given challenge targeting the 7 mm balloon. This was exchanged for a 10 mm by 4 cm mustang balloon which was inflated in similar position. The BRK needle was advanced and tented the medial aspect of the balloon with inability to rupture/enter. Therefore, the inner stylet of the [place] needle was removed and a V18 wire was inserted which formed around the peripheral aspect of the balloon. The balloon was deflated and removed. Through the right arm access, a 15 mm loop snare was then inserted and the V18 wire was captured  in the right internal jugular vein. The wire was then pulled through the right arm sheath the great through and through, flossing access. A quick cross 135 cm catheter was then inserted from the right groin sheath over the wire, exiting the right arm sheath. The wire was removed and exchanged for 0.014 inch Spartacore wire. From the groin approach, a 4 mm x 150 mm coyote balloon was inserted and balloon angioplasty was performed about the recanalized right innominate vein. There was a high-grade focal stenosis just inferior to the right clavicle. Venogram was performed from the right arm  sheath after removal of the balloon which demonstrated occlusion of the right axillary and right subclavian veins. A 125 cm, 5 French angled tip catheter was then inserted over the wire from the right groin approach exiting right upper extremity sheath. A 0.014 inch wire was removed and exchanged for a 0.035 inch stiff exchange Glidewire. Next, balloon angioplasty was performed with a 10 mm x 4 cm mustang balloon in multiple stations from the level of the innominate vein through the right axillary vein. Intravascular ultrasound was then performed through the right heart and into the right axillary vein. 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 Sizing in length was demarcated with fluoroscopic reference using the IVUS catheter. Next, a 14 mm x 80 mm Abre self expanding stent was deployed under fluoroscopic guidance from the right subclavian vein through the superior aspect of the superior vena cava. Post balloon molding was performed with a 14 mm x 4 cm atlas balloon. Venogram demonstrated persistent multifocal occlusion/high-grade stenosis in the right axillary and peripheral right subclavian vein. There are multiple filling defects within the otherwise patent stent. Additional balloon angioplasty was performed of the right axillary and right subclavian veins as well as through the indwelling stent. Right upper extremity venogram demonstrated improved patency inflow with multiple filling defects remaining throughout the length of the stent, particularly at the level of the first rib/clavicle. Therefore, additional post balloon molding with a 14 mm x 4 cm atlas balloon was performed. Completion venogram from the right upper extremity demonstrated brisk antegrade flow through the right subclavian vein, indwelling  stent, and into the superior vena cava. There are persistent moderate filling defects about the level of first rib/clavicle, which were recalcitrant to aggressive balloon molding. Intravascular ultrasound was repeated throughout the length of the stent and right subclavian vein which demonstrated significantly improved patency in excellent proximal distal wall apposition with multiple intraluminal echogenic irregularities through the midportion of the stent. Attempt at venogram from the right IJ was a made, however the indwelling sheath and catheter had backed out of the intraluminal position and were positioned in the right neck soft tissues. This sheath, along with right upper extremity right groin sheaths were removed and hemostasis was achieved with manual compression. Sterile bandages were applied. The patient was administered 1 milligram/kilogram Lovenox injection prior to leaving the IR suite. The patient was extubated and transferred to the postanesthesia care unit in stable condition. IMPRESSION: 1. 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 2. Technically successful sharp recanalization of the right innominate vein. 3. Right subclavian to superior vena cava self expanding stent placement (14 x 80 mm Abre). PLAN: After extensive conversation with Pharmacy regarding Ms. Rayson' drug drug interactions, decision was made to initiate therapeutic Lovenox for 1 month and indefinite  Plavix. Plan for follow-up CT chest in 1 month with concomitant clinic appointment. Ruthann Cancer, MD [facility] Radiology Electronically Signed   By: Ruthann Cancer M.D.   On: 11/24/2021 09:49   IR US Guide Vasc Access Right  Result Date: 11/24/2021 INDICATION: 45 year old female with history of chronic iron deficiency requiring routine iron transfusions status post bilateral subclavian vein port placements, both of which have been removed due to development of central venous  occlusion and SVC syndrome, right-side predominant. Status post unsuccessful catheter and wire central venous recanalization on 09/08/2021. She presents today for sharp recanalization due to persistent SVC syndrome symptoms. EXAM: 1. Ultrasound-guided vascular access of the right common femoral vein, right brachial vein, and right internal jugular vein. 2. Right upper extremity and central venography. 3. Sharp recanalization of the right innominate vein. 4. Balloon angioplasty of the right axillary vein, subclavian vein, right innominate vein, and superior vena cava. 5. Right subclavian vein to superior vena cava stent placement. 6. Intravascular ultrasound. COMPARISON:  08/21/2021, 09/08/2021 MEDICATIONS: None. ANESTHESIA/SEDATION: The procedure was performed under general anesthesia. FLUOROSCOPY TIME:  Fluoroscopy Time: 58 minutes 16 seconds (1,881 mGy). CONTRAST:  150 mL Omnipaque 300, intravenous COMPLICATIONS: None immediate. TECHNIQUE: Informed written consent was obtained from the patient after a thorough discussion of the procedural risks, benefits and alternatives. All questions were addressed. Maximal Sterile Barrier Technique was utilized including caps, mask, sterile gowns, sterile gloves, sterile drape, hand hygiene and skin antiseptic. A timeout was performed prior to the initiation of the procedure. The right groin, right upper extremity, and right neck were prepped and draped in standard fashion. The right upper extremity was then examined which demonstrated a patent right brachial vein. A small skin nick was made at the planned needle entry site. Under direct ultrasound visualization, a 21 gauge micropuncture needle was directed into the right brachial vein. An image was captured and stored in a permanent record. A Micropuncture sheath was placed. A Wholey wire was directed to the level of the subclavian vein. The micropuncture sheath was exchanged for a 6 French, 35 cm vascular sheath. A 5 [place]  Kumpe the catheter was inserted to gain more central access in the subclavian vein. Limited right upper extremity venogram demonstrated irregular appearance of the central right subclavian vein with stenotic but patent communication with the central right internal jugular vein. There is no antegrade flow through the innominate vein. There are multiple cervical and right chest wall collateral veins present. Ultrasound evaluation demonstrated patency of the right common femoral vein. A small skin nick was made at the planned needle entry site. Under direct ultrasound visualization, a 21 gauge micropuncture needle was directed into the right common femoral vein. An image was captured and stored the permanent record. A micropuncture sheath was placed. A Wholey wire was then directed to the inferior vena cava under fluoroscopic guidance and the micro puncture sheath was exchanged for a 12 French, 35 cm vascular sheath. Subsequently, in coaxial fashion, a 7 [place], 65 cm sheath was introduced over the wire into the superior vena cava. Simultaneous right upper extremity and central venogram was performed which demonstrated complete occlusion of the right innominate vein extending into the peripheral superior vena cava. The mid to central superior vena cava and azygous veins are patent. Given the patency of the internal jugular vein, right internal jugular vein access was not pursued. Ultrasound evaluation demonstrated patency of the right internal jugular vein. A small skin nick was made at the  planned needle entry site. Under direct ultrasound visualization, a 21 gauge micropuncture needle was directed into the right internal jugular vein. An image was captured and stored in the permanent record. A micropuncture sheath was placed. A Wholey wire was then directed into the internal jugular vein under fluoroscopic guidance and a micropuncture sheath was exchanged for a 6 [place], 10 cm vascular sheath. A 5 French angled tip  catheter was then directed to the central aspect of the right internal jugular vein at the confluence with the subclavian vein, just above the occluded innominate vein. Venogram was performed which demonstrated multiple cervical and chest wall collaterals with several patent collaterals with the subclavian vein. From the inferior approach, an angled tip 5 French catheter was directed to the apex of the occluded superior vena cava. A stiff Glidewire was then used to attempt to catheter and wire recanalization of the innominate vein which was unsuccessful. Therefore, from the right arm approach, a 7 mm x 20 mm mustang balloon was inserted and positioned at the level of the jugular and subclavian confluence and inflated. From the right groin, in 89 cm BRK needle was advanced over fluoroscopic guidance with multiple live fluoro obliquities to target the balloon. Given challenge targeting the 7 mm balloon. This was exchanged for a 10 mm by 4 cm mustang balloon which was inflated in similar position. The BRK needle was advanced and tented the medial aspect of the balloon with inability to rupture/enter. Therefore, the inner stylet of the [place] needle was removed and a V18 wire was inserted which formed around the peripheral aspect of the balloon. The balloon was deflated and removed. Through the right arm access, a 15 mm loop snare was then inserted and the V18 wire was captured in the right internal jugular vein. The wire was then pulled through the right arm sheath the great through and through, flossing access. A quick cross 135 cm catheter was then inserted from the right groin sheath over the wire, exiting the right arm sheath. The wire was removed and exchanged for 0.014 inch Spartacore wire. From the groin approach, a 4 mm x 150 mm coyote balloon was inserted and balloon angioplasty was performed about the recanalized right innominate vein. There was a high-grade focal stenosis just inferior to the right clavicle.  Venogram was performed from the right arm sheath after removal of the balloon which demonstrated occlusion of the right axillary and right subclavian veins. A 125 cm, 5 French angled tip catheter was then inserted over the wire from the right groin approach exiting right upper extremity sheath. A 0.014 inch wire was removed and exchanged for a 0.035 inch stiff exchange Glidewire. Next, balloon angioplasty was performed with a 10 mm x 4 cm mustang balloon in multiple stations from the level of the innominate vein through the right axillary vein. Intravascular ultrasound was then performed through the right heart and into the right axillary vein. 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 Sizing in length was demarcated with fluoroscopic reference using the IVUS catheter. Next, a 14 mm x 80 mm Abre self expanding stent was deployed under fluoroscopic guidance from the right subclavian vein through the superior aspect of the superior vena cava. Post balloon molding was performed with a 14 mm x 4 cm atlas balloon. Venogram demonstrated persistent multifocal occlusion/high-grade stenosis in the right  axillary and peripheral right subclavian vein. There are multiple filling defects within the otherwise patent stent. Additional balloon angioplasty was performed of the right axillary and right subclavian veins as well as through the indwelling stent. Right upper extremity venogram demonstrated improved patency inflow with multiple filling defects remaining throughout the length of the stent, particularly at the level of the first rib/clavicle. Therefore, additional post balloon molding with a 14 mm x 4 cm atlas balloon was performed. Completion venogram from the right upper extremity demonstrated brisk antegrade flow through  the right subclavian vein, indwelling stent, and into the superior vena cava. There are persistent moderate filling defects about the level of first rib/clavicle, which were recalcitrant to aggressive balloon molding. Intravascular ultrasound was repeated throughout the length of the stent and right subclavian vein which demonstrated significantly improved patency in excellent proximal distal wall apposition with multiple intraluminal echogenic irregularities through the midportion of the stent. Attempt at venogram from the right IJ was a made, however the indwelling sheath and catheter had backed out of the intraluminal position and were positioned in the right neck soft tissues. This sheath, along with right upper extremity right groin sheaths were removed and hemostasis was achieved with manual compression. Sterile bandages were applied. The patient was administered 1 milligram/kilogram Lovenox injection prior to leaving the IR suite. The patient was extubated and transferred to the postanesthesia care unit in stable condition. IMPRESSION: 1. 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 2. Technically successful sharp recanalization of the right innominate vein. 3. Right subclavian to superior vena cava self expanding stent placement (14 x 80 mm Abre). PLAN: After extensive conversation with Pharmacy regarding Ms. Trego' drug drug interactions, decision was made to initiate therapeutic Lovenox for 1 month and indefinite Plavix. Plan for follow-up CT chest in 1 month with concomitant clinic appointment. Ruthann Cancer, MD [facility] Radiology Electronically Signed   By: Ruthann Cancer M.D.   On: 11/24/2021 09:49   DG Chest Port 1 View  Result Date: 12/06/2021 CLINICAL DATA:  Sepsis, wound infection. EXAM: PORTABLE CHEST 1 VIEW COMPARISON:  None. FINDINGS: The heart size and mediastinal contours are within normal limits. Vascular stent overlying the right  subclavian vessel. Both lungs are clear. The visualized skeletal structures are unremarkable. IMPRESSION: No active disease. Electronically Signed   By: Keane Police D.O.   On: 12/10/2021 11:56   VAS [name] GROIN PSEUDOANEURYSM  Result Date: 12/15/2021  ARTERIAL PSEUDOANEURYSM  Patient Name:  Jane Edwards  Date of Exam:   11/19/2021 Medical Rec #: [id number]            Accession #:    [id number] Date of Birth: 02-06-1976            Patient Gender: F Patient Age:   69 years Exam Location:  [facility] Procedure:      VAS [name] GROIN PSEUDOANEURYSM Referring Phys: GRACE LOEFFLER --------------------------------------------------------------------------------  Exam: Right groin Indications: Patient complains of groin pain. History: S/p catheterization. Comparison Study: No prior studies. Performing Technologist: Archie Patten RVS  Examination Guidelines: A complete evaluation includes B-mode imaging, spectral Doppler, color Doppler, and power Doppler as needed of all accessible portions of each vessel. Bilateral testing is considered an integral part of a complete examination. Limited examinations for reoccurring indications may be performed as noted. +------------+----------+---------+------+----------+ Right DuplexPSV (cm/s)Waveform PlaqueComment(s) +------------+----------+---------+------+----------+ CFA             91    triphasic                 +------------+----------+---------+------+----------+  Summary: No evidence of pseudoaneurysm, AVF or DVT An avascular, hypoechoic focal area measuring 8 cm x 6 cm with irregular borders and containing internal septations and debris was visualized in the right groin at the area of most concern. Diagnosing physician: Deitra Mayo MD Electronically signed by Deitra Mayo MD on 11/20/2021 at 4:01:12 PM.   --------------------------------------------------------------------------------    Final    IR INTRAVASCULAR ULTRASOUND NON  CORONARY  Result Date: 11/24/2021 INDICATION: 45 year old female with history of chronic iron deficiency requiring routine iron transfusions status post bilateral subclavian vein port placements, both of which have been removed due to development of central venous occlusion and SVC syndrome, right-side predominant. Status post unsuccessful catheter and wire central venous recanalization on 09/08/2021. She presents today for sharp recanalization due to persistent SVC syndrome symptoms. EXAM: 1. Ultrasound-guided vascular access of the right common femoral vein, right brachial vein, and right internal jugular vein. 2. Right upper extremity and central venography. 3. Sharp recanalization of the right innominate vein. 4. Balloon angioplasty of the right axillary vein, subclavian vein, right innominate vein, and superior vena cava. 5. Right subclavian vein to superior vena cava stent placement. 6. Intravascular ultrasound. COMPARISON:  08/21/2021, 09/08/2021 MEDICATIONS: None. ANESTHESIA/SEDATION: The procedure was performed under general anesthesia. FLUOROSCOPY TIME:  Fluoroscopy Time: 58 minutes 16 seconds (1,881 mGy). CONTRAST:  150 mL Omnipaque 300, intravenous COMPLICATIONS: None immediate. TECHNIQUE: Informed written consent was obtained from the patient after a thorough discussion of the procedural risks, benefits and alternatives. All questions were addressed. Maximal Sterile Barrier Technique was utilized including caps, mask, sterile gowns, sterile gloves, sterile drape, hand hygiene and skin antiseptic. A timeout was performed prior to the initiation of the procedure. The right groin, right upper extremity, and right neck were prepped and draped in standard fashion. The right upper extremity was then examined which demonstrated a patent right brachial vein. A small skin nick was made at the planned needle entry site. Under direct ultrasound visualization, a 21 gauge micropuncture needle was directed into the  right brachial vein. An image was captured and stored in a permanent record. A Micropuncture sheath was placed. A Wholey wire was directed to the level of the subclavian vein. The micropuncture sheath was exchanged for a 6 French, 35 cm vascular sheath. A 5 [place] Kumpe the catheter was inserted to gain more central access in the subclavian vein. Limited right upper extremity venogram demonstrated irregular appearance of the central right subclavian vein with stenotic but patent communication with the central right internal jugular vein. There is no antegrade flow through the innominate vein. There are multiple cervical and right chest wall collateral veins present. Ultrasound evaluation demonstrated patency of the right common femoral vein. A small skin nick was made at the planned needle entry site. Under direct ultrasound visualization, a 21 gauge micropuncture needle was directed into the right common femoral vein. An image was captured and stored the permanent record. A micropuncture sheath was placed. A Wholey wire was then directed to the inferior vena cava under fluoroscopic guidance and the micro puncture sheath was exchanged for a 12 French, 35 cm vascular sheath. Subsequently, in coaxial fashion, a 7 [place], 65 cm sheath was introduced over the wire into the superior vena cava. Simultaneous right upper extremity and central venogram was performed which demonstrated complete occlusion of the right innominate vein extending into the peripheral superior vena cava. The mid to central superior vena cava and azygous veins are patent. Given the patency of the internal jugular vein,  right internal jugular vein access was not pursued. Ultrasound evaluation demonstrated patency of the right internal jugular vein. A small skin nick was made at the planned needle entry site. Under direct ultrasound visualization, a 21 gauge micropuncture needle was directed into the right internal jugular vein. An image was captured  and stored in the permanent record. A micropuncture sheath was placed. A Wholey wire was then directed into the internal jugular vein under fluoroscopic guidance and a micropuncture sheath was exchanged for a 6 [place], 10 cm vascular sheath. A 5 French angled tip catheter was then directed to the central aspect of the right internal jugular vein at the confluence with the subclavian vein, just above the occluded innominate vein. Venogram was performed which demonstrated multiple cervical and chest wall collaterals with several patent collaterals with the subclavian vein. From the inferior approach, an angled tip 5 French catheter was directed to the apex of the occluded superior vena cava. A stiff Glidewire was then used to attempt to catheter and wire recanalization of the innominate vein which was unsuccessful. Therefore, from the right arm approach, a 7 mm x 20 mm mustang balloon was inserted and positioned at the level of the jugular and subclavian confluence and inflated. From the right groin, in 89 cm BRK needle was advanced over fluoroscopic guidance with multiple live fluoro obliquities to target the balloon. Given challenge targeting the 7 mm balloon. This was exchanged for a 10 mm by 4 cm mustang balloon which was inflated in similar position. The BRK needle was advanced and tented the medial aspect of the balloon with inability to rupture/enter. Therefore, the inner stylet of the [place] needle was removed and a V18 wire was inserted which formed around the peripheral aspect of the balloon. The balloon was deflated and removed. Through the right arm access, a 15 mm loop snare was then inserted and the V18 wire was captured in the right internal jugular vein. The wire was then pulled through the right arm sheath the great through and through, flossing access. A quick cross 135 cm catheter was then inserted from the right groin sheath over the wire, exiting the right arm sheath. The wire was removed and  exchanged for 0.014 inch Spartacore wire. From the groin approach, a 4 mm x 150 mm coyote balloon was inserted and balloon angioplasty was performed about the recanalized right innominate vein. There was a high-grade focal stenosis just inferior to the right clavicle. Venogram was performed from the right arm sheath after removal of the balloon which demonstrated occlusion of the right axillary and right subclavian veins. A 125 cm, 5 French angled tip catheter was then inserted over the wire from the right groin approach exiting right upper extremity sheath. A 0.014 inch wire was removed and exchanged for a 0.035 inch stiff exchange Glidewire. Next, balloon angioplasty was performed with a 10 mm x 4 cm mustang balloon in multiple stations from the level of the innominate vein through the right axillary vein. Intravascular ultrasound was then performed through the right heart and into the right axillary vein. 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 Sizing in length was demarcated with fluoroscopic reference using the IVUS catheter. Next, a 14 mm x 80 mm Abre self expanding stent was deployed under fluoroscopic guidance from the right subclavian vein through the superior aspect of the  superior vena cava. Post balloon molding was performed with a 14 mm x 4 cm atlas balloon. Venogram demonstrated persistent multifocal occlusion/high-grade stenosis in the right axillary and peripheral right subclavian vein. There are multiple filling defects within the otherwise patent stent. Additional balloon angioplasty was performed of the right axillary and right subclavian veins as well as through the indwelling stent. Right upper extremity venogram demonstrated improved patency inflow with multiple filling defects remaining  throughout the length of the stent, particularly at the level of the first rib/clavicle. Therefore, additional post balloon molding with a 14 mm x 4 cm atlas balloon was performed. Completion venogram from the right upper extremity demonstrated brisk antegrade flow through the right subclavian vein, indwelling stent, and into the superior vena cava. There are persistent moderate filling defects about the level of first rib/clavicle, which were recalcitrant to aggressive balloon molding. Intravascular ultrasound was repeated throughout the length of the stent and right subclavian vein which demonstrated significantly improved patency in excellent proximal distal wall apposition with multiple intraluminal echogenic irregularities through the midportion of the stent. Attempt at venogram from the right IJ was a made, however the indwelling sheath and catheter had backed out of the intraluminal position and were positioned in the right neck soft tissues. This sheath, along with right upper extremity right groin sheaths were removed and hemostasis was achieved with manual compression. Sterile bandages were applied. The patient was administered 1 milligram/kilogram Lovenox injection prior to leaving the IR suite. The patient was extubated and transferred to the postanesthesia care unit in stable condition. IMPRESSION: 1. 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 2. Technically successful sharp recanalization of the right innominate vein. 3. Right subclavian to superior vena cava self expanding stent placement (14 x 80 mm Abre). PLAN: After extensive conversation with Pharmacy regarding Ms. Mckenzie' drug drug interactions, decision was made to initiate therapeutic Lovenox for 1 month and indefinite Plavix. Plan for follow-up CT chest in 1 month with concomitant clinic appointment. Ruthann Cancer, MD [facility] Radiology Electronically Signed   By: Ruthann Cancer M.D.   On:  11/24/2021 09:49   IR TRANSCATH PLC STENT 1ST ART NOT LE CV CAR VERT CAR  Result Date: 11/24/2021 INDICATION: 45 year old female with history of chronic iron deficiency requiring routine iron transfusions status post bilateral subclavian vein port placements, both of which have been removed due to development of central venous occlusion and SVC syndrome, right-side predominant. Status post unsuccessful catheter and wire central venous recanalization on 09/08/2021. She presents today for sharp recanalization due to persistent SVC syndrome symptoms. EXAM: 1. Ultrasound-guided vascular access of the right common femoral vein, right brachial vein, and right internal jugular vein. 2. Right upper extremity and central venography. 3. Sharp recanalization of the right innominate vein. 4. Balloon angioplasty of the right axillary vein, subclavian vein, right innominate vein, and superior vena cava. 5. Right subclavian vein to superior vena cava stent placement. 6. Intravascular ultrasound. COMPARISON:  08/21/2021, 09/08/2021 MEDICATIONS: None. ANESTHESIA/SEDATION: The procedure was performed under general anesthesia. FLUOROSCOPY TIME:  Fluoroscopy Time: 58 minutes 16 seconds (1,881 mGy). CONTRAST:  150 mL Omnipaque 300, intravenous COMPLICATIONS: None immediate. TECHNIQUE: Informed written consent was obtained from the patient after a thorough discussion of the procedural risks, benefits and alternatives. All questions were addressed. Maximal Sterile Barrier Technique was utilized including caps, mask, sterile gowns, sterile gloves, sterile drape, hand hygiene and skin antiseptic. A timeout was performed prior to the initiation of the procedure. The right  groin, right upper extremity, and right neck were prepped and draped in standard fashion. The right upper extremity was then examined which demonstrated a patent right brachial vein. A small skin nick was made at the planned needle entry site. Under direct ultrasound  visualization, a 21 gauge micropuncture needle was directed into the right brachial vein. An image was captured and stored in a permanent record. A Micropuncture sheath was placed. A Wholey wire was directed to the level of the subclavian vein. The micropuncture sheath was exchanged for a 6 French, 35 cm vascular sheath. A 5 [place] Kumpe the catheter was inserted to gain more central access in the subclavian vein. Limited right upper extremity venogram demonstrated irregular appearance of the central right subclavian vein with stenotic but patent communication with the central right internal jugular vein. There is no antegrade flow through the innominate vein. There are multiple cervical and right chest wall collateral veins present. Ultrasound evaluation demonstrated patency of the right common femoral vein. A small skin nick was made at the planned needle entry site. Under direct ultrasound visualization, a 21 gauge micropuncture needle was directed into the right common femoral vein. An image was captured and stored the permanent record. A micropuncture sheath was placed. A Wholey wire was then directed to the inferior vena cava under fluoroscopic guidance and the micro puncture sheath was exchanged for a 12 French, 35 cm vascular sheath. Subsequently, in coaxial fashion, a 7 [place], 65 cm sheath was introduced over the wire into the superior vena cava. Simultaneous right upper extremity and central venogram was performed which demonstrated complete occlusion of the right innominate vein extending into the peripheral superior vena cava. The mid to central superior vena cava and azygous veins are patent. Given the patency of the internal jugular vein, right internal jugular vein access was not pursued. Ultrasound evaluation demonstrated patency of the right internal jugular vein. A small skin nick was made at the planned needle entry site. Under direct ultrasound visualization, a 21 gauge micropuncture needle was  directed into the right internal jugular vein. An image was captured and stored in the permanent record. A micropuncture sheath was placed. A Wholey wire was then directed into the internal jugular vein under fluoroscopic guidance and a micropuncture sheath was exchanged for a 6 [place], 10 cm vascular sheath. A 5 French angled tip catheter was then directed to the central aspect of the right internal jugular vein at the confluence with the subclavian vein, just above the occluded innominate vein. Venogram was performed which demonstrated multiple cervical and chest wall collaterals with several patent collaterals with the subclavian vein. From the inferior approach, an angled tip 5 French catheter was directed to the apex of the occluded superior vena cava. A stiff Glidewire was then used to attempt to catheter and wire recanalization of the innominate vein which was unsuccessful. Therefore, from the right arm approach, a 7 mm x 20 mm mustang balloon was inserted and positioned at the level of the jugular and subclavian confluence and inflated. From the right groin, in 89 cm BRK needle was advanced over fluoroscopic guidance with multiple live fluoro obliquities to target the balloon. Given challenge targeting the 7 mm balloon. This was exchanged for a 10 mm by 4 cm mustang balloon which was inflated in similar position. The BRK needle was advanced and tented the medial aspect of the balloon with inability to rupture/enter. Therefore, the inner stylet of the [place] needle was removed and a V18 wire was inserted  which formed around the peripheral aspect of the balloon. The balloon was deflated and removed. Through the right arm access, a 15 mm loop snare was then inserted and the V18 wire was captured in the right internal jugular vein. The wire was then pulled through the right arm sheath the great through and through, flossing access. A quick cross 135 cm catheter was then inserted from the right groin sheath over the  wire, exiting the right arm sheath. The wire was removed and exchanged for 0.014 inch Spartacore wire. From the groin approach, a 4 mm x 150 mm coyote balloon was inserted and balloon angioplasty was performed about the recanalized right innominate vein. There was a high-grade focal stenosis just inferior to the right clavicle. Venogram was performed from the right arm sheath after removal of the balloon which demonstrated occlusion of the right axillary and right subclavian veins. A 125 cm, 5 French angled tip catheter was then inserted over the wire from the right groin approach exiting right upper extremity sheath. A 0.014 inch wire was removed and exchanged for a 0.035 inch stiff exchange Glidewire. Next, balloon angioplasty was performed with a 10 mm x 4 cm mustang balloon in multiple stations from the level of the innominate vein through the right axillary vein. Intravascular ultrasound was then performed through the right heart and into the right axillary vein. 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 Sizing in length was demarcated with fluoroscopic reference using the IVUS catheter. Next, a 14 mm x 80 mm Abre self expanding stent was deployed under fluoroscopic guidance from the right subclavian vein through the superior aspect of the superior vena cava. Post balloon molding was performed with a 14 mm x 4 cm atlas balloon. Venogram demonstrated persistent multifocal occlusion/high-grade stenosis in the right axillary and peripheral right subclavian vein. There are multiple filling defects within the otherwise patent stent. Additional balloon angioplasty was performed of the right axillary and right subclavian veins as well as through the indwelling stent. Right upper extremity venogram demonstrated improved  patency inflow with multiple filling defects remaining throughout the length of the stent, particularly at the level of the first rib/clavicle. Therefore, additional post balloon molding with a 14 mm x 4 cm atlas balloon was performed. Completion venogram from the right upper extremity demonstrated brisk antegrade flow through the right subclavian vein, indwelling stent, and into the superior vena cava. There are persistent moderate filling defects about the level of first rib/clavicle, which were recalcitrant to aggressive balloon molding. Intravascular ultrasound was repeated throughout the length of the stent and right subclavian vein which demonstrated significantly improved patency in excellent proximal distal wall apposition with multiple intraluminal echogenic irregularities through the midportion of the stent. Attempt at venogram from the right IJ was a made, however the indwelling sheath and catheter had backed out of the intraluminal position and were positioned in the right neck soft tissues. This sheath, along with right upper extremity right groin sheaths were removed and hemostasis was achieved with manual compression. Sterile bandages were applied. The patient was administered 1 milligram/kilogram Lovenox injection prior to leaving the IR suite. The patient was extubated and transferred to the postanesthesia care unit in stable condition. IMPRESSION: 1. 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 2. Technically successful sharp recanalization of the right innominate vein. 3. Right subclavian to superior vena  cava self expanding stent placement (14 x 80 mm Abre). PLAN: After extensive conversation with Pharmacy regarding Ms. Shippee' drug drug interactions, decision was made to initiate therapeutic Lovenox for 1 month and indefinite Plavix. Plan for follow-up CT chest in 1 month with concomitant clinic appointment. Ruthann Cancer, MD [facility]  Radiology Electronically Signed   By: Ruthann Cancer M.D.   On: 11/24/2021 09:49   IR EMBO TUMOR ORGAN ISCHEMIA INFARCT INC GUIDE ROADMAPPING  Result Date: 11/04/2021 INDICATION: 45 year old female with history of uterine fibroids and menorrhagia resulting and chronic iron deficiency anemia requiring iron transfusions. EXAM: 1. Ultrasound-guided vascular access of the left radial artery. 2. Pelvic angiogram. 3. Selective catheterization of bilateral uterine arteries. 4. Particle embolization of the bilateral uterine arteries. MEDICATIONS: Vancomycin 1.5 IV. The antibiotic was administered within 1 hour of the procedure. 8 mg Zofran, intravenous. 1 g intravenous Tylenol. ANESTHESIA/SEDATION: Moderate (conscious) sedation was employed during this procedure. A total of Versed 0.5 mg and Fentanyl 50 mcg was administered intravenously. Moderate Sedation Time: 82 minutes. The patient's level of consciousness and vital signs were monitored continuously by radiology nursing throughout the procedure under my direct supervision. CONTRAST:  51m OMNIPAQUE IOHEXOL 300 MG/ML SOLN, 548mOMNIPAQUE IOHEXOL 300 MG/ML SOLN FLUOROSCOPY TIME:  Fluoroscopy Time: 20 minutes 42 seconds (1,812 mGy). COMPLICATIONS: None immediate. PROCEDURE: Informed consent was obtained from the patient following explanation of the procedure, risks, benefits and alternatives. The patient understands, agrees and consents for the procedure. All questions were addressed. A time out was performed prior to the initiation of the procedure. Maximal barrier sterile technique utilized including caps, mask, sterile gowns, sterile gloves, large sterile drape, hand hygiene, and Betadine prep. The left wrist was prepped and draped in standard fashion. BaNickolas Madridest was "A". Subdermal Local anesthesia was provided at the planned needle entry site. Under direct ultrasound visualization, the left radial artery was punctured with a 4 cm micropuncture needle. A permanent  image was captured and stored. A micropuncture wire was inserted without difficulty. Additional local anesthetic was administered and a small skin nick was made. The needle was removed and a 4/5 French slender sheath was inserted. A radial cocktail was slowly administered. Through the sheath, a 5 French MG2 Glide catheter was inserted over a B[place]on wire under direct fluoroscopic guidance from the left upper extremity to the abdominal aorta. The right common and internal iliac arteries were selected. Right internal iliac angiogram was then performed which demonstrated a patent uterine artery arising from the anterior division. A Renegade high flow and 0.016 fathom wire were used to select the right uterine artery. The horizontal portion of the uterine artery was catheterized and angiogram was performed. The right hemi uterus was visualized with at least 1 hyperenhancing mass compatible with a uterine fibroid. There was no evidence of vesicular, rectal, or vaginal branches. Embolization was then performed with 500-700 micron embospheres. After injection of approximately 1 vial, there was noted to be sluggish flow in the uterine artery. 3 mL of preservative-free lidocaine were then injected intra-arterially. The microcatheter was then removed. The 5 French catheter was then retracted and used to select the left common and internal iliac artery. Left internal iliac angiogram demonstrated a patent uterine artery arising from the anterior division. A Renegade high flow and 0.016 fathom wire were used to select the left uterine artery. The horizontal portion of the uterine artery was catheterized and angiogram was performed. The left hemi uterus was visualized with at least  1 hyperenhancing mass compatible with a uterine fibroid. There was no evidence of vesicular, rectal, or vaginal branches. Embolization was then performed with 500-700 micron embospheres. After injection of approximately 1 vial, there was noted to be  sluggish flow in the uterine artery. 3 mL of preservative-free lidocaine were then injected intra-arterially. The microcatheter was then removed. The 5 French catheter was then retracted to the abdominal aorta. Pelvic angiogram was then performed which demonstrated widely patent bilateral inflow vessels with relative pruning of the previously visualized bilateral uterine arteries. The catheter was then removed. A TR band was applied to the left radial sheath which was then removed without complication. The patient tolerated procedure well was transferred to the post procedural area in good condition. IMPRESSION: Technically successful bilateral uterine artery embolization. Ruthann Cancer, MD [facility] Radiology Electronically Signed   By: Ruthann Cancer M.D.   On: 11/04/2021 14:53    Labs:  CBC: Recent Labs    11/24/21 0631 11/30/21 0000 12/15/2021 1300 12/14/2021 1330 12/14/2021 1332 12/17/2021 1926 12/02/21 0219 12/02/21 0251  WBC 10.0 5.6 16.8*  --   --   --  32.2*  --   HGB 13.1 12.2 7.9*   < > 9.2* 5.4* 5.0* 5.4*  HCT 40.4 37 26.2*   < > 27.0* 16.0* 16.1* 16.0*  PLT 174 161 278  --   --   --  308  --    < > = values in this interval not displayed.    COAGS: Recent Labs    09/04/21 1344 11/04/21 0721 11/23/21 0850  INR 1.0 1.0 1.0  APTT 29  --   --     BMP: Recent Labs    01/14/21 1150 04/07/21 0000 11/23/21 0850 11/24/21 0631 12/14/2021 1300 12/06/2021 1330 11/21/2021 1332 12/03/2021 1926 12/02/21 0219 12/02/21 0251  NA 140   < > 135 134* 138   < > 137 139 134* 137  K 4.4   < > 4.3 4.0 4.5   < > 4.5 4.5 5.4* 5.3*  CL 109*   < > 107 103 108  --  108  --  107  --   CO2 18*   < > 20* 21* 16*  --   --   --  <7*  --   GLUCOSE 123*   < > 156* 126* 173*  --  169*  --  121*  --   BUN 8   < > '10 9 15  ' --  15  --  21*  --   CALCIUM 9.1   < > 9.3 8.7* 8.8*  --   --   --  7.8*  --   CREATININE 0.86   < > 0.92 0.97 1.72*  --  1.60*  --   2.68*  --   GFRNONAA 83   < > >60 >60 37*  --   --   --  22*  --   GFRAA 96  --   --   --   --   --   --   --   --   --    < > = values in this interval not displayed.    LIVER FUNCTION TESTS: Recent Labs    01/14/21 1150 04/07/21 0000 07/31/21 0000 09/03/21 1546 11/26/2021 1300 12/02/21 0219  BILITOT 0.3  --   --  0.6 1.0 0.9  AST 24   < > 46* 76* 47* 240*  ALT 18   < >  32 69* 47* 144*  ALKPHOS 108   < > 101 93 73 65  PROT 7.0  --   --  6.7 5.7* 4.9*  ALBUMIN 4.1   < > 4.0 3.6 3.0* 2.4*   < > = values in this interval not displayed.    TUMOR MARKERS: No results for input(s): AFPTM, CEA, CA199, CHROMGRNA in the last 8760 hours.  Assessment and Plan:  Acute blood loss with unknown origin; critically ill with progressive multi-organ failure from blood loss; Jehovah's Witness with refusal of blood products:  Dr. Serafina Royals met with the patient's parents in the ICU to discuss Joanny's current clinical situation. While imaging obtained yesterday did not reveal a definite source of bleeding her hemoglobin levels continue to drop - currently 5.0 on this morning's lab work. Without the ability to administer blood products due to religious beliefs the remaining options for treatment are limited. Dr. Serafina Royals discussed the possibility that a pelvic angiogram may reveal a source of bleeding that could potentially be embolized. The risks, benefits and alternatives were discussed and Orlanda's parents were in agreement to proceed.   This procedure was discussed with Dr. Tamala Julian with the critical care team who is also in support of this option. Due to a contrast allergy and the patient's current clinical status the decision was made to intubate the patient for airway protection and respiratory support.   The patient is scheduled for an emergent image-guided pelvic angiogram with possible embolization this morning. IR will bring the patient down as soon as possible. Written consent obtained from Larosa's  mother Ames Coupe and the consent is in IR.    Thank you for this interesting consult.  I greatly enjoyed meeting Shalika Arntz and look forward to participating in their care.  A copy of this report was sent to the requesting provider on this date.  Electronically Signed: Soyla Dryer, AGACNP-BC [phone] 12/02/2021, 9:49 AM   I spent a total of 40 Minutes    in face to face in clinical consultation, greater than 50% of which was counseling/coordinating care for pelvic angiogram with possible embolization

## 2021-12-02 NOTE — Progress Notes (Signed)
[facility] Progress Note Patient Name: Jane Edwards DOB: 1975-12-28 MRN: [id number]   Date of Service  12/02/2021  HPI/Events of Note  Patient with a significant metabolic acidosis with partial respiratory compensation, likely secondary to profound acute blood loss anemia (unfortunately she will not accept blood transfusion due to being a Jehovah's witness), K+ is also elevated at 5.4.  eICU Interventions  Sodium bicarbonate gtt started at 50 ml / hour x 1 liter, Lokelma 5 gm po x 1 ordered.        Kerry Kass Lilit Cinelli 12/02/2021, 5:36 AM

## 2021-12-02 NOTE — Procedures (Signed)
Interventional Radiology Procedure Note  Procedure:  1) Right pelvic angiogram 2) Coil embolization of right inferior epigastric artery  Findings: Please refer to procedural dictation for full description. No active extravasation visualized.  Diffuse vasospasm.  Prophylactic coil embolization of right inferior epigastric artery.  Manual compression held to left common femoral artery puncture site.  Complications: None immediate  Estimated Blood Loss: < 5 mL  Recommendations: Strict 6 hour flat supine bedrest.   Ruthann Cancer, MD Pager: [phone]

## 2021-12-02 NOTE — Procedures (Signed)
Patient Name: Jane Edwards  MRN: [id number]  Epilepsy Attending: Lora Havens  Referring Physician/Provider: Dr Ina Homes Date: 12/02/2021 Duration: 21.20 mins  Patient history: 45 year old female with altered mental status.  EEG to evaluate for seizures.  Level of alertness: Awake  AEDs during EEG study: None  Technical aspects: This EEG study was done with scalp electrodes positioned according to the 10-20 International system of electrode placement. Electrical activity was acquired at a sampling rate of 500Hz  and reviewed with a high frequency filter of 70Hz  and a low frequency filter of 1Hz . EEG data were recorded continuously and digitally stored.   Description: EEG showed continuous generalized 3 to 6 Hz theta-delta slowing. Hyperventilation and photic stimulation were not performed.     ABNORMALITY - Continuous slow, generalized  IMPRESSION: This study is suggestive of moderate diffuse encephalopathy, nonspecific etiology. No seizures or epileptiform discharges were seen throughout the recording.   [name]

## 2021-12-02 NOTE — Progress Notes (Signed)
Full progress note to follow. She is developing multiorgan failure from blood loss. She and mother have refused blood. Mother en route. Will probably end up on vent and good chance of dying from this as we are unable to treat underlying acute medical condition due to religious beliefs. Will give some IV iron and push bicarb.  Erskine Emery MD PCCM

## 2021-12-02 NOTE — Procedures (Signed)
Intubation Procedure Note  Jane Edwards  [id number]  Aug 01, 1976  Date:12/02/21  Time:10:19 AM   Provider Performing:Misa Fedorko [name]    Procedure: Intubation (53794)  Indication(s) Respiratory Failure  Consent Risks of the procedure as well as the alternatives and risks of each were explained to the patient and/or caregiver.  Consent for the procedure was obtained and is signed in the bedside chart   Anesthesia Etomidate and Rocuronium   Time Out Verified patient identification, verified procedure, site/side was marked, verified correct patient position, special equipment/implants available, medications/allergies/relevant history reviewed, required imaging and test results available.   Sterile Technique Usual hand hygeine, masks, and gloves were used   Procedure Description Patient positioned in bed supine.  Sedation given as noted above.  Patient was intubated with endotracheal tube using Glidescope.  View was Grade 1 full glottis - anterior epiglottis.  Number of attempts was 1.  Colorimetric CO2 detector was consistent with tracheal placement.   Complications/Tolerance None; patient tolerated the procedure well. Chest X-ray is ordered to verify placement.   EBL Minimal   Specimen(s) None

## 2021-12-02 NOTE — Progress Notes (Signed)
PHARMACY - PHYSICIAN COMMUNICATION CRITICAL VALUE ALERT - BLOOD CULTURE IDENTIFICATION (BCID)  Jane Edwards is an 45 y.o. female who presented to [facility] on 12/13/2021  Assessment:  31 yof s/p recent recanalization and stent placement for SVC occlusion by IR on 11/23/21, developed right groin hematoma with possible infection/sepsis, started on vancomycin/cefepime. 1 of 4 BCx bottles growing staph epi, mecA+.  Name of physician (or Provider) Contacted: Erskine Emery (CCM)  Current antibiotics: vancomycin/cefepime  Changes to prescribed antibiotics recommended:  Patient is on recommended antibiotics - No changes needed  Results for orders placed or performed during the hospital encounter of 12/19/2021  Blood Culture ID Panel (Reflexed) (Collected: 12/07/2021  1:00 PM)  Result Value Ref Range   Enterococcus faecalis NOT DETECTED NOT DETECTED   Enterococcus Faecium NOT DETECTED NOT DETECTED   Listeria monocytogenes NOT DETECTED NOT DETECTED   Staphylococcus species DETECTED (A) NOT DETECTED   Staphylococcus aureus (BCID) NOT DETECTED NOT DETECTED   Staphylococcus epidermidis DETECTED (A) NOT DETECTED   Staphylococcus lugdunensis NOT DETECTED NOT DETECTED   Streptococcus species NOT DETECTED NOT DETECTED   Streptococcus agalactiae NOT DETECTED NOT DETECTED   Streptococcus pneumoniae NOT DETECTED NOT DETECTED   Streptococcus pyogenes NOT DETECTED NOT DETECTED   A.calcoaceticus-baumannii NOT DETECTED NOT DETECTED   Bacteroides fragilis NOT DETECTED NOT DETECTED   Enterobacterales NOT DETECTED NOT DETECTED   Enterobacter cloacae complex NOT DETECTED NOT DETECTED   Escherichia coli NOT DETECTED NOT DETECTED   Klebsiella aerogenes NOT DETECTED NOT DETECTED   Klebsiella oxytoca NOT DETECTED NOT DETECTED   Klebsiella pneumoniae NOT DETECTED NOT DETECTED   Proteus species NOT DETECTED NOT DETECTED   Salmonella species NOT DETECTED NOT DETECTED   Serratia marcescens NOT DETECTED NOT  DETECTED   Haemophilus influenzae NOT DETECTED NOT DETECTED   Neisseria meningitidis NOT DETECTED NOT DETECTED   Pseudomonas aeruginosa NOT DETECTED NOT DETECTED   Stenotrophomonas maltophilia NOT DETECTED NOT DETECTED   Candida albicans NOT DETECTED NOT DETECTED   Candida auris NOT DETECTED NOT DETECTED   Candida glabrata NOT DETECTED NOT DETECTED   Candida krusei NOT DETECTED NOT DETECTED   Candida parapsilosis NOT DETECTED NOT DETECTED   Candida tropicalis NOT DETECTED NOT DETECTED   Cryptococcus neoformans/gattii NOT DETECTED NOT DETECTED   Methicillin resistance mecA/C DETECTED (A) NOT DETECTED    Arturo Morton, PharmD, BCPS Please check AMION for all [facility] contact numbers Clinical Pharmacist 12/02/2021 5:26 PM

## 2021-12-03 DIAGNOSIS — I97638 Postprocedural hematoma of a circulatory system organ or structure following other circulatory system procedure: Secondary | ICD-10-CM | POA: Diagnosis not present

## 2021-12-03 LAB — BASIC METABOLIC PANEL
Anion gap: 15 (ref 5–15)
BUN: 35 mg/dL — ABNORMAL HIGH (ref 6–20)
CO2: 18 mmol/L — ABNORMAL LOW (ref 22–32)
Calcium: 6.8 mg/dL — ABNORMAL LOW (ref 8.9–10.3)
Chloride: 101 mmol/L (ref 98–111)
Creatinine, Ser: 3.76 mg/dL — ABNORMAL HIGH (ref 0.44–1.00)
GFR, Estimated: 15 mL/min — ABNORMAL LOW (ref >60)
Glucose, Bld: 196 mg/dL — ABNORMAL HIGH (ref 70–99)
Potassium: 3.3 mmol/L — ABNORMAL LOW (ref 3.5–5.1)
Sodium: 134 mmol/L — ABNORMAL LOW (ref 135–145)

## 2021-12-03 LAB — CULTURE, BLOOD (ROUTINE X 2)

## 2021-12-03 LAB — CBC
HCT: 14.8 % — ABNORMAL LOW (ref 36.0–46.0)
Hemoglobin: 5.1 g/dL — CL (ref 12.0–15.0)
MCH: 29.7 pg (ref 26.0–34.0)
MCHC: 34.5 g/dL (ref 30.0–36.0)
MCV: 86 fL (ref 80.0–100.0)
Platelets: 203 10*3/uL (ref 150–400)
RBC: 1.72 MIL/uL — ABNORMAL LOW (ref 3.87–5.11)
RDW: 13.2 % (ref 11.5–15.5)
WBC: 21.1 10*3/uL — ABNORMAL HIGH (ref 4.0–10.5)
nRBC: 0.9 % — ABNORMAL HIGH (ref 0.0–0.2)

## 2021-12-03 LAB — GLUCOSE, CAPILLARY
Glucose-Capillary: 145 mg/dL — ABNORMAL HIGH (ref 70–99)
Glucose-Capillary: 125 mg/dL — ABNORMAL HIGH (ref 70–99)
Glucose-Capillary: 127 mg/dL — ABNORMAL HIGH (ref 70–99)

## 2021-12-03 LAB — TRIGLYCERIDES: Triglycerides: 251 mg/dL — ABNORMAL HIGH (ref <150)

## 2021-12-03 MED ORDER — POTASSIUM CHLORIDE 10 MEQ/50ML IV SOLN
10.0000 meq | INTRAVENOUS | Status: AC
  Administered 2021-12-03 (×2): 10 meq via INTRAVENOUS
  Filled 2021-12-03 (×2): qty 50

## 2021-12-03 MED ORDER — SODIUM BICARBONATE 8.4 % IV SOLN
INTRAVENOUS | Status: AC
  Filled 2021-12-03: qty 1000

## 2021-12-03 MED ORDER — ONDANSETRON 4 MG PO TBDP: 4.0000 mg | ORAL_TABLET | Freq: Four times a day (QID) | ORAL | Status: DC | PRN

## 2021-12-03 MED ORDER — HALOPERIDOL LACTATE 5 MG/ML IJ SOLN: 0.5000 mg | INTRAMUSCULAR | Status: DC | PRN

## 2021-12-03 MED ORDER — PROSOURCE TF PO LIQD: 90.0000 mL | Freq: Two times a day (BID) | ORAL | Status: DC

## 2021-12-03 MED ORDER — ACETAMINOPHEN 325 MG PO TABS: 650.0000 mg | ORAL_TABLET | Freq: Four times a day (QID) | ORAL | Status: DC | PRN

## 2021-12-03 MED ORDER — MORPHINE 100MG IN NS 100ML (1MG/ML) PREMIX INFUSION
0.0000 mg/h | INTRAVENOUS | Status: DC
  Administered 2021-12-04 (×2): 10 mg/h via INTRAVENOUS
  Administered 2021-12-04: 5 mg/h via INTRAVENOUS
  Filled 2021-12-03 (×3): qty 100

## 2021-12-03 MED ORDER — BIOTENE DRY MOUTH MT LIQD: 15.0000 mL | OROMUCOSAL | Status: DC | PRN

## 2021-12-03 MED ORDER — GLYCOPYRROLATE 0.2 MG/ML IJ SOLN
0.2000 mg | INTRAMUSCULAR | Status: DC | PRN
  Administered 2021-12-04 – 2021-12-05 (×3): 0.2 mg via INTRAVENOUS
  Filled 2021-12-03 (×3): qty 1

## 2021-12-03 MED ORDER — LORAZEPAM 2 MG/ML IJ SOLN
1.0000 mg | INTRAMUSCULAR | Status: DC | PRN
  Administered 2021-12-04: 1 mg via INTRAVENOUS
  Filled 2021-12-03: qty 1

## 2021-12-03 MED ORDER — LORAZEPAM 1 MG PO TABS: 1.0000 mg | ORAL_TABLET | ORAL | Status: DC | PRN

## 2021-12-03 MED ORDER — HALOPERIDOL 0.5 MG PO TABS: 0.5000 mg | ORAL_TABLET | ORAL | Status: DC | PRN

## 2021-12-03 MED ORDER — FOLIC ACID 1 MG PO TABS
1.0000 mg | ORAL_TABLET | Freq: Every day | ORAL | Status: DC
  Administered 2021-12-03: 1 mg
  Filled 2021-12-03: qty 1

## 2021-12-03 MED ORDER — ONDANSETRON HCL 4 MG/2ML IJ SOLN: 4.0000 mg | Freq: Four times a day (QID) | INTRAMUSCULAR | Status: DC | PRN

## 2021-12-03 MED ORDER — LORAZEPAM 2 MG/ML PO CONC: 1.0000 mg | ORAL | Status: DC | PRN

## 2021-12-03 MED ORDER — GLYCOPYRROLATE 1 MG PO TABS
1.0000 mg | ORAL_TABLET | ORAL | Status: DC | PRN
  Filled 2021-12-03: qty 1

## 2021-12-03 MED ORDER — CALCIUM GLUCONATE-NACL 1-0.675 GM/50ML-% IV SOLN
1.0000 g | Freq: Once | INTRAVENOUS | Status: AC
  Administered 2021-12-03: 1000 mg via INTRAVENOUS
  Filled 2021-12-03: qty 50

## 2021-12-03 MED ORDER — PANTOPRAZOLE 2 MG/ML SUSPENSION
40.0000 mg | Freq: Every day | ORAL | Status: DC
  Administered 2021-12-03: 40 mg
  Filled 2021-12-03: qty 20

## 2021-12-03 MED ORDER — HALOPERIDOL LACTATE 2 MG/ML PO CONC: 0.5000 mg | ORAL | Status: DC | PRN

## 2021-12-03 MED ORDER — VITAMIN B-12 100 MCG PO TABS
100.0000 ug | ORAL_TABLET | Freq: Every day | ORAL | Status: DC
  Administered 2021-12-03: 100 ug
  Filled 2021-12-03 (×3): qty 1

## 2021-12-03 MED ORDER — POLYVINYL ALCOHOL 1.4 % OP SOLN: 1.0000 [drp] | Freq: Four times a day (QID) | OPHTHALMIC | Status: DC | PRN

## 2021-12-03 MED ORDER — GLYCOPYRROLATE 0.2 MG/ML IJ SOLN: 0.2000 mg | INTRAMUSCULAR | Status: DC | PRN

## 2021-12-03 MED ORDER — INSULIN ASPART 100 UNIT/ML IJ SOLN
1.0000 [IU] | INTRAMUSCULAR | Status: DC
Start: 2021-12-03 — End: 2021-12-03
  Administered 2021-12-03 (×3): 1 [IU] via SUBCUTANEOUS
  Administered 2021-12-03: 2 [IU] via SUBCUTANEOUS

## 2021-12-03 MED ORDER — VITAL HIGH PROTEIN PO LIQD
1000.0000 mL | ORAL | Status: DC
  Filled 2021-12-03 (×2): qty 1000

## 2021-12-03 MED ORDER — DARBEPOETIN ALFA 200 MCG/0.4ML IJ SOSY
200.0000 ug | PREFILLED_SYRINGE | INTRAMUSCULAR | Status: DC
  Administered 2021-12-03: 200 ug via SUBCUTANEOUS
  Filled 2021-12-03: qty 0.4

## 2021-12-03 MED ORDER — SODIUM CHLORIDE 0.9 % IV SOLN: 2.0000 g | INTRAVENOUS | Status: DC

## 2021-12-03 MED ORDER — MORPHINE 100MG IN NS 100ML (1MG/ML) PREMIX INFUSION
5.0000 mg/h | INTRAVENOUS | Status: DC
  Administered 2021-12-03: 5 mg/h via INTRAVENOUS
  Filled 2021-12-03: qty 100

## 2021-12-03 MED ORDER — MORPHINE BOLUS VIA INFUSION
2.0000 mg | INTRAVENOUS | Status: DC | PRN
  Administered 2021-12-04 (×2): 2 mg via INTRAVENOUS
  Filled 2021-12-03: qty 2

## 2021-12-03 MED ORDER — ACETAMINOPHEN 650 MG RE SUPP: 650.0000 mg | Freq: Four times a day (QID) | RECTAL | Status: DC | PRN

## 2021-12-03 NOTE — Progress Notes (Signed)
[facility] Progress Note Patient Name: Chevie Birkhead DOB: 08-Jul-1976 MRN: [id number]   Date of Service  12/03/2021  HPI/Events of Note  Patient needs an order for sliding scale Insulin coverage.  eICU Interventions  SSI ordered.        Kerry Kass Laiken Sandy 12/03/2021, 12:50 AM

## 2021-12-03 NOTE — Progress Notes (Signed)
Clinical update:  Had extensive discussion with patient's parents, who both hold healthcare power of attorney status. They both stated that the patient has expressed to them, on numerous occasions, that she would not like to ever be resuscitated, thus code status was changed to DNR.  They also noted that the patient would not like to be left on any "machines" to prolong her life and requested that the patient be extubated at this time and that we focus on making her comfortable. Orders to extubate and end of life comfort measures were placed at this time.   Buddy Duty Internal Medicine Resident, PGY-1

## 2021-12-03 NOTE — Procedures (Signed)
Extubation Procedure Note  Patient Details:   Name: [name] DOB: Nov 16, 1976 MRN: [id number]   Airway Documentation:    Vent end date: 12/03/21 Vent end time: 0076   Evaluation  O2 sats: stable throughout Complications: Complications of extubated to comfort care Patient did tolerate procedure well. Bilateral Breath Sounds: Clear, Diminished   No  Patient extubated per order and family wishes to comfort care. Positive cuff leak was noted prior to extubation. RT will continue to monitor.   Jhoanna Heyde Clyda Greener 12/03/2021, 2:56 PM

## 2021-12-03 NOTE — Progress Notes (Signed)
NAME:  Jane Edwards, MRN:  [id number], DOB:  05-17-1976, LOS: 2 ADMISSION DATE:  12/17/2021, CONSULTATION DATE:  12/13 REFERRING MD:  Eulis Foster, CHIEF COMPLAINT:  weakness/sepsis   History of Present Illness:  Jane Edwards is a 45 yo female with past medical history as stated below. She had recanalization and stent placement for SVC occlusion by IR on 11/24/21.  On 12/13, the patient sustained a mechanical fall and then developed weakness, and soreness in Rt groin area with an expanding bruise. She presented to the ED and began to develop waxing/waning mental status in ER with episodes of nausea and vomiting.  She was started on ABx for possible sepsis. Found to have worsening anemia with low BP and PCCM asked to assess for admission to ICU. Of note, patient is a Jehovah's witness and declines all blood products.   Pertinent  Medical History  HTN, DM type 2, Seizures, Iron deficiency with q72month iron transfusion, Menorrhagia, Poor venous access with prior indwelling subclavian vein ports, Uterine embolization, CHF, Chronic fatigue, Chronic headaches, Depression with anxiety, HTN, HLD, OSA, PTSD  Significant Hospital Events: Including procedures, antibiotic start and stop dates in addition to other pertinent events   12/13 admitted to ICU, vascular surg consulted, started on abx for sepsis 12/14 Hb 5.0- patient is Jehovah's witness and declines all blood products- received iron dextran infusion. Intubated  12/14 Underwent prophylactic coil embolization of R inferior epigastric artery with IR.   Interim History / Subjective:  Sedated and remains on ventilator. Unable to follow commands.   Objective   Blood pressure (!) 125/58, pulse 85, temperature 97.8 F (36.6 C), temperature source Axillary, resp. rate (!) 27, height 5\' 1"  (1.549 m), weight (!) 148.1 kg, SpO2 100 %.    Vent Mode: PRVC FiO2 (%):  [40 %-100 %] 40 % Set Rate:  [30 bmp] 30 bmp Vt Set:  [450 mL] 450 mL Pressure  Support:  [8 cmH20] 8 cmH20 Plateau Pressure:  [19 cmH20-21 cmH20] 21 cmH20   Intake/Output Summary (Last 24 hours) at 12/03/2021 0622 Last data filed at 12/03/2021 0500 Gross per 24 hour  Intake 3952.02 ml  Output 90 ml  Net 3862.02 ml   Filed Weights   12/02/21 0500 12/03/21 0326  Weight: (!) 137 kg (!) 148.1 kg    Examination: General: NAD, on ventilator  HENT: [place], AT Lungs: CTAB, normal effort on ventilator.  Cardiovascular: RRR, no murmur Abdomen: hematoma in R groin, soft and improving. +BS, soft, nontender Extremities: L fem central line. 1-2+ edema bilateral LE Neuro: Sedated, unable to follow commands  Hb stable at 5.1 sCr continues to rise to 3.76, GFR 15  Resolved Hospital Problem list     Assessment & Plan:  Sepsis likely secondary to R groin hematoma/infection Lactic acidosis Altered mental status secondary to above 1/4 blood cultures growing staph epi, unclear if contaminant vs true growth. R groin hematoma appears to be shrinking, is soft.  - Vascular surgery consulted, no plans for surg - Continue vanc and cefepime  - Continue to follow blood cultures  Hypotension  Secondary to above.  - Continue IV fluids: NS 75 cc/hr - Continue levophed - Maintain MAP >65  Acute blood loss anemia Hb noted to be 12.2 3 days ago and has been downtrending. Hb stable at 5.1 this morning (5 yesterday). 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 Patient is developing multiorgan failure from acute blood loss. S/p IV iron dextran infusion on 12/14. Also s/p  coil embolization of R inf epigastric artery with IR - Monitor CBC - Consider EPO?  Acute respiratory failure requiring mechanical ventilation Metabolic acidosis Intubated on 12/14. pH 7.393, pCO2 24 and bicarb 14.6, improved compared to prior ABG. Trigs elevated to 251 today - continue bicarb gtt - D/c propofol in setting of elevated trigs  Acute renal failure sCr continues to rise from 2.68  yesterday to 3.76 today. Acute renal failure likely secondary to profound acute blood loss anemia. Patient is oliguric with only 90 cc urine output recorded in last 24 hrs. - Avoid nephrotoxins - Monitor BMP - IVF as above - Consider nephro consult for CRRT  Hypokalemia K 3.3 this morning. Repleted  Hypocalcemia Corrected calcium 8.1. - calcium gluconate 1 g    Best Practice (right click and "Reselect all SmartList Selections" daily)   Diet/type: NPO DVT prophylaxis: not indicated GI prophylaxis: N/A Lines: Central line and Arterial Line Foley:  Yes, and it is still needed Code Status:  full code Last date of multidisciplinary goals of care discussion [updated mother 12/14]  Labs   CBC: Recent Labs  Lab 11/30/21 0000 12/04/2021 1300 12/08/2021 1330 12/16/2021 1926 12/02/21 0219 12/02/21 0251 12/02/21 1501 12/03/21 0316  WBC 5.6 16.8*  --   --  32.2*  --  36.1* 21.1*  NEUTROABS 4.20 13.8*  --   --   --   --   --   --   HGB 12.2 7.9*   < > 5.4* 5.0* 5.4* 5.1* 5.1*  HCT 37 26.2*   < > 16.0* 16.1* 16.0* 15.4* 14.8*  MCV  --  92.9  --   --  91.0  --  87.0 86.0  PLT 161 278  --   --  308  --  235 203   < > = values in this interval not displayed.    Basic Metabolic Panel: Recent Labs  Lab 11/30/2021 1300 12/14/2021 1330 12/08/2021 1332 11/24/2021 1926 12/02/21 0219 12/02/21 0251 12/02/21 1247 12/03/21 0316  NA 138   < > 137 139 134* 137 140 134*  K 4.5   < > 4.5 4.5 5.4* 5.3* 4.5 3.3*  CL 108  --  108  --  107  --  105 101  CO2 16*  --   --   --  <7*  --  15* 18*  GLUCOSE 173*  --  169*  --  121*  --  192* 196*  BUN 15  --  15  --  21*  --  25* 35*  CREATININE 1.72*  --  1.60*  --  2.68*  --  3.13* 3.76*  CALCIUM 8.8*  --   --   --  7.8*  --  7.3* 6.8*   < > = values in this interval not displayed.   GFR: Estimated Creatinine Clearance: 26.5 mL/min (A) (by C-G formula based on SCr of 3.76 mg/dL (H)). Recent Labs  Lab 11/27/2021 1300 12/02/21 0219 12/02/21 1501  12/03/21 0316  WBC 16.8* 32.2* 36.1* 21.1*  LATICACIDVEN 6.1*  --   --   --     Liver Function Tests: Recent Labs  Lab 12/07/2021 1300 12/02/21 0219  AST 47* 240*  ALT 47* 144*  ALKPHOS 73 65  BILITOT 1.0 0.9  PROT 5.7* 4.9*  ALBUMIN 3.0* 2.4*   Recent Labs  Lab 11/20/2021 1300  LIPASE 21   No results for input(s): AMMONIA in the last 168 hours.  ABG    Component Value Date/Time  PHART 7.393 12/02/2021 1307   PCO2ART 24.3 (L) 12/02/2021 1307   PO2ART 438 (H) 12/02/2021 1307   HCO3 14.6 (L) 12/02/2021 1307   TCO2 9 (L) 12/02/2021 0251   ACIDBASEDEF 9.4 (H) 12/02/2021 1307   O2SAT 99.9 12/02/2021 1307     Coagulation Profile: No results for input(s): INR, PROTIME in the last 168 hours.  Cardiac Enzymes: Recent Labs  Lab 12/02/21 1247  CKTOTAL 138    HbA1C: No results found for: HGBA1C  CBG: Recent Labs  Lab 12/02/21 1258 12/02/21 1544 12/02/21 1912 12/02/21 2318 12/03/21 0343  GLUCAP 137* 147* 169* 180* 145*    Review of Systems:     Critical care time: 35 minutes

## 2021-12-03 NOTE — Progress Notes (Signed)
[facility] Progress Note Patient Name: Jane Edwards DOB: 29-May-1976 MRN: [id number]   Date of Service  12/03/2021  HPI/Events of Note  K+ 3.3, corrected Calcium 8.1 gm / dl.  eICU Interventions  KCL 10 meq iv Q 1 hour x 2 doses, Calcium gluconate 1 gm iv bolus x 1.        Kerry Kass Ross Hefferan 12/03/2021, 4:53 AM

## 2021-12-03 NOTE — Progress Notes (Signed)
Initial Nutrition Assessment  DOCUMENTATION CODES:  Morbid obesity  INTERVENTION:  Initiate tube feeding via OGT: Vital High Protein at 60 ml/h (1440 ml per day) Prosource TF 90 ml BID  Provides 1600 kcal, 166 gm protein, 1209 ml free water daily.  Free water per CCM.  NUTRITION DIAGNOSIS:  Inadequate oral intake related to inability to eat as evidenced by NPO status.  GOAL:  Provide needs based on ASPEN/SCCM guidelines  MONITOR:  TF tolerance, Vent status, Labs, Weight trends, I & O's  REASON FOR ASSESSMENT:  Ventilator, Consult Enteral/tube feeding initiation and management  ASSESSMENT:  45 yo female with a PMH of  CHF, HTN, DM2, chronic iron deficiency requiring routine iron transfusions (patient is a Jehovah's Witness), uterine fibroids with menorrhagia, morbid obesity, epilepsy and cognitive impairment. She also has a history of central venous occlusion and SVC syndrome. Admitted with shock. 12/13 - septic 12/14 - intubated; OGT placed; prophylactic coil embolization of R inferior epigastric artery with IR  Patient is currently intubated on ventilator support. MV: 13.4 L/min Temp (24hrs), Avg:98 F (36.7 C), Min:97.7 F (36.5 C), Max:98.7 F (37.1 C)  Propofol: off  Discussed patient with RN. Pt to remain ventilated today and likely will want to start feeding.  Pt's weight remains stable over the last 5 months per Epic.  Of note, pt with mild generalized edema.  Medications: reviewed; SSI, Protonix, Precedex, Levophed, Na Bicarb @ 50 ml/hr, vasopressin  Labs: reviewed; Na 134 (L), K 3.3 (L), CBG 125-180 (H)  NUTRITION - FOCUSED PHYSICAL EXAM: Flowsheet Row Most Recent Value  Orbital Region No depletion  Upper Arm Region No depletion  Thoracic and Lumbar Region No depletion  Buccal Region No depletion  Temple Region No depletion  Clavicle Bone Region No depletion  Clavicle and Acromion Bone Region No depletion  Scapular Bone Region No depletion  Dorsal  Hand No depletion  Patellar Region No depletion  Anterior Thigh Region No depletion  Posterior Calf Region No depletion  Edema (RD Assessment) Mild  Hair Reviewed  Eyes Unable to assess  Mouth Unable to assess  Skin Reviewed  Nails Reviewed   Diet Order:   Diet Order             Diet NPO time specified Except for: Sips with Meds  Diet effective now                  EDUCATION NEEDS:  No education needs have been identified at this time  Skin:  Skin Assessment: Skin Integrity Issues: Skin Integrity Issues:: Incisions Incisions: L groin, closed (12/14)  Last BM:  PTA  Height:  Ht Readings from Last 1 Encounters:  12/18/2021 5\' 1"  (1.549 m)   Weight:  Wt Readings from Last 1 Encounters:  12/03/21 (!) 148.1 kg   Ideal Body Weight: 47.7 kg  BMI:  Body mass index is 61.69 kg/m.  Estimated Nutritional Needs:  Kcal:  1400-1600 Protein:  150-200 grams Fluid:  >1.2 L  Derrel Nip, RD, LDN (she/her/hers) Clinical Inpatient Dietitian RD Pager/After-Hours/Weekend Pager # in [place]

## 2021-12-03 NOTE — Progress Notes (Signed)
[facility] Progress Note Patient Name: Jane Edwards DOB: 12/06/1976 MRN: [id number]   Date of Service  12/03/2021  HPI/Events of Note  Patient with significantly improved metabolic acidosis (Bicarb now 18 on BMP), will renew gtt for 6 hours and obtain ABG to verify resolution of metabolic acidosis, once verified will plan to discontinue Bicarb gtt.  eICU Interventions  See above.        Kerry Kass Mellany Dinsmore 12/03/2021, 4:03 AM

## 2021-12-03 NOTE — Progress Notes (Signed)
° °  VASCULAR SURGERY ASSESSMENT & PLAN:   RIGHT GROIN HEMATOMA: The groin is softer.  I have no plans for intervention.  Vascular surgery will be available as needed.   SUBJECTIVE:   Arousable.  PHYSICAL EXAM:   Vitals:   12/03/21 0530 12/03/21 0600 12/03/21 0630 12/03/21 0800  BP:      Pulse:      Resp: (!) 27 (!) 27 (!) 0   Temp:    97.9 F (36.6 C)  TempSrc:    Axillary  SpO2:      Weight:      Height:       The right groin hematoma is softer.  LABS:   Lab Results  Component Value Date   WBC 21.1 (H) 12/03/2021   HGB 5.1 (LL) 12/03/2021   HCT 14.8 (L) 12/03/2021   MCV 86.0 12/03/2021   PLT 203 12/03/2021   Lab Results  Component Value Date   CREATININE 3.76 (H) 12/03/2021   Lab Results  Component Value Date   INR 1.0 11/23/2021   CBG (last 3)  Recent Labs    12/02/21 2318 12/03/21 0343 12/03/21 0725  GLUCAP 180* 145* 125*    PROBLEM LIST:    Principal Problem:   Shock ([place])   CURRENT MEDS:    carbamazepine  300 mg Per Tube Daily   chlorhexidine gluconate (MEDLINE KIT)  15 mL Mouth Rinse BID   Chlorhexidine Gluconate Cloth  6 each Topical Daily   insulin aspart  1-3 Units Subcutaneous Q4H   mouth rinse  15 mL Mouth Rinse 10 times per day   sodium chloride flush  10-40 mL Intracatheter Q12H   vancomycin variable dose per unstable renal function (pharmacist dosing)   Does not apply See admin instructions   venlafaxine  25 mg Per Tube Q8H    Deitra Mayo Office: [phone] 12/03/2021

## 2021-12-03 NOTE — Progress Notes (Signed)
Supervising Physician: Corrie Mckusick  Patient Status:  [facility] - In-pt  Chief Complaint: Acute blood loss s/p right pelvic angiogram with prophylactic coil embolization of the right inferior epigastric artery via the left common femoral artery   Subjective: Intubated/sedated; remains on levophed. Unresponsive.   Allergies: Gabapentin, Ibuprofen, Iodinated diagnostic agents, Latex, Dilaudid [hydromorphone], Lisinopril, and Rocephin [ceftriaxone]  Medications: Prior to Admission medications   Medication Sig Start Date End Date Taking? Authorizing Provider  amLODipine (NORVASC) 10 MG tablet Take 10 mg by mouth daily. 01/04/20  Yes [provider]  bumetanide (BUMEX) 1 MG tablet Take 1 mg by mouth daily.   Yes [provider]  carbamazepine (TEGRETOL) 200 MG tablet Take 1.5 tablets (300 mg total) by mouth 2 (two) times daily. Patient taking differently: Take 300 mg by mouth daily. 02/05/21  Yes Kathrynn Ducking, MD  carvedilol (COREG) 25 MG tablet Take 25 mg by mouth 2 (two) times daily with a meal. 11/06/18  Yes [provider]  Cholecalciferol (D3-50) 1.25 MG (50000 UT) capsule Take 50,000 Units by mouth once a week.   Yes [provider]  clopidogrel (PLAVIX) 75 MG tablet Take 1 tablet (75 mg total) by mouth daily. 11/25/21 12/25/21 Yes Han, Aimee H, PA-C  desvenlafaxine (PRISTIQ) 100 MG 24 hr tablet Take 1 tablet (100 mg total) by mouth daily. 09/28/21  Yes Arfeen, Arlyce Harman, MD  diphenhydrAMINE (BENADRYL) 50 MG capsule Take one capsule at 0800 11/16 11/04/21  Yes Allred, Darrell K, PA-C  diphenhydrAMINE (BENADRYL) 50 MG capsule Take one capsule 1 hour prior to procedure. 11/06/21  Yes Candiss Norse A, PA-C  enoxaparin (LOVENOX) 150 MG/ML injection Inject 0.94 mLs (140 mg total) into the skin every 12 (twelve) hours. 11/24/21 12/26/21 Yes Han, Aimee H, PA-C  ferrous sulfate 325 (65 FE) MG tablet Take 325 mg by mouth daily with breakfast.   Yes [provider]  magnesium oxide (MAG-OX) 400 MG tablet Take 400 mg by mouth daily.   Yes [provider]  medroxyPROGESTERone Acetate 150 MG/ML SUSY Inject 1 mL into the muscle every 3 (three) months. 07/11/21  Yes [provider]  predniSONE & diphenhydrAMINE (CONTRAST ALLERGY PREMED PACK) 3 x 50 MG & 1 x 50 MG KIT Take 50 mg by mouth as directed. 09/08/21  Yes Jacqualine Mau, NP  promethazine (PHENERGAN) 12.5 MG tablet Take 1 tablet (12.5 mg total) by mouth every 4 (four) hours as needed for nausea or vomiting. 11/04/21  Yes Allred, Darrell K, PA-C  spironolactone (ALDACTONE) 50 MG tablet Take 50 mg by mouth daily.   Yes [provider]  topiramate ER (QUDEXY XR) 150 MG CS24 sprinkle capsule Take 2 capsules by mouth every day 11/16/21  Yes Camara, Maryan Puls, MD  traZODone (DESYREL) 100 MG tablet Take 0.5-1 tablets (50-100 mg total) by mouth at bedtime. Patient taking differently: Take 50 mg by mouth at bedtime. 09/28/21  Yes Arfeen, Arlyce Harman, MD  furosemide (LASIX) 40 MG tablet Take 1 tablet (40 mg total) by mouth 2 (two) times daily. 09/07/21 10/07/21  Barb Merino, MD     Vital Signs: BP 131/62    Pulse 88    Temp 98.7 F (37.1 C) (Axillary)    Resp (!) 30    Ht '5\' 1"'  (1.549 m)    Wt (!) 326 lb 8 oz (148.1 kg)    LMP  (LMP Unknown) Comment: Irregular - unsure when the last   SpO2 100%  BMI 61.69 kg/m   Physical Exam Constitutional:      Comments: Intubated/sedated. Unresponsive during exam.   Cardiovascular:     Comments: Left groin vascular access site is clean, soft and dry. Right groin vascular site still has ecchymosis but is softer than yesterday.  Genitourinary:    Comments: foley Skin:    General: Skin is warm and dry.    Imaging: CT Abdomen Pelvis Wo Contrast  Result Date: 12/19/2021 CLINICAL DATA:  Right groin hematoma assess for retroperitoneal hematoma EXAM: CT ABDOMEN AND PELVIS WITHOUT CONTRAST TECHNIQUE: Multidetector CT imaging of the  abdomen and pelvis was performed following the standard protocol without IV contrast. COMPARISON:  MRI 09/29/2021, CT 08/21/2021 FINDINGS: Lower chest: Lung basesdemonstrate no acute consolidation or effusion. Normal cardiac size. Incompletely visualized skin thickening and right breast edema. Hepatobiliary: Hepatic steatosis. No calcified gallstone or biliary dilatation Pancreas: Unremarkable. No pancreatic ductal dilatation or surrounding inflammatory changes. Spleen: Normal in size without focal abnormality. Adrenals/Urinary Tract: Adrenal glands are unremarkable. Kidneys are normal, without renal calculi, focal lesion, or hydronephrosis. Bladder contains Foley catheter. Stomach/Bowel: Stomach is within normal limits. Appendix appears normal. No evidence of bowel wall thickening, distention, or inflammatory changes. Vascular/Lymphatic: Nonaneurysmal aorta. Left femoral venous and arterial catheters at the groin with tips terminating in the external iliac vein and common femoral artery. No suspicious nodes. Reproductive: Lobulated uterus, history of fibroids. No adnexal mass Other: Negative for pelvic effusion or free air. Negative for retroperitoneal hematoma. Large slightly hyperdense collection within the right lower quadrant subcutaneous fat, this measures 17.4 by 7.8 cm and would be consistent with a soft tissue hematoma. There are multiple additional hyperdense collections within the right flank subcutaneous soft tissue also consistent with hematoma. Soft tissue stranding and hyperdense collections extend to the right groin. Multiple hematocrit level in the right groin collection consistent with recent bleeding. Collection in the right groin measures approximately 8.4 by 5.1 cm. Indistinct appearance of the soft tissues surrounding the right common femoral artery and vein. Musculoskeletal: No acute osseous abnormality IMPRESSION: 1. Multiple large hyperdense collections within the subcutaneous fat of the  right lower quadrant abdominal wall, right flank, and right groin consistent with hematomas. There are multiple hematocrit levels in the collections at the right groin, suspect for recent bleeding. There is soft tissue stranding and indistinct appearance around the common femoral vessels on the right. There is no evidence for a retroperitoneal hematoma. 2. No CT evidence for acute intra-abdominal or pelvic abnormality. 3. Lobulated uterus consistent with history of fibroids 4. Incompletely visualized right breast skin thickening and edema. Electronically Signed   By: Donavan Foil M.D.   On: 11/23/2021 23:20   IR Angiogram Pelvis Selective Or Supraselective  Result Date: 12/02/2021 INDICATION: 45 year old female with history of right inguinal and right lower abdominal wall hematoma with hemorrhagic shock. EXAM: 1. Ultrasound-guided vascular access of the left common femoral artery. 2. Selective catheterization angiography of the right common iliac artery, right common femoral artery, and right inferior epigastric artery. 3. Coil embolization of the right inferior epigastric artery. MEDICATIONS: None. ANESTHESIA/SEDATION: None. CONTRAST:  49m OMNIPAQUE IOHEXOL 300 MG/ML SOLN, 212mOMNIPAQUE IOHEXOL 300 MG/ML SOLN FLUOROSCOPY TIME:  Fluoroscopy Time: 5 minutes 18 seconds (636 mGy). COMPLICATIONS: None immediate. PROCEDURE: Informed consent was obtained from the patient following explanation of the procedure, risks, benefits and alternatives. The patient understands, agrees and consents for the procedure. All questions were addressed. A time out was performed prior to the initiation of the procedure. Maximal  barrier sterile technique utilized including caps, mask, sterile gowns, sterile gloves, large sterile drape, hand hygiene, and Betadine prep. The left groin was prepped and draped in standard fashion. Preprocedure ultrasound evaluation demonstrated patency of the left common femoral artery. The procedure was  planned. Subdermal Local anesthesia was provided with 1% lidocaine. A small skin nick was made. Under direct ultrasound visualization, the left common femoral artery was accessed with a 21 gauge micropuncture needle. An image was captured and stored the permanent record. A micropuncture sheath was placed in exchanged over an 035 J wire for a 5 [place], 11 cm vascular sheath. A 5 French omni Flush catheter was directed to the level of the aortic bifurcation. The right common and external iliac arteries were selected with a Glidewire over which the catheter was advanced into the right common iliac artery. Angiogram was then performed which demonstrated wide patency of the right common and external iliac arteries as well as the proximal superficial and profunda femoral arteries. There was no evidence of active extravasation. Given findings on recent comparison CT abdomen pelvis, the right inferior epigastric artery was then selectively catheterized with a Renegade hi Flo microcatheter and fathom 16 microwire. Angiogram was then performed which demonstrated diffuse vasospasm4444 of the inferior mesenteric artery and its branches without evidence of active extravasation. Given this appearance, prophylactic coil embolization was performed of the proximal right inferior epigastric artery with a 3 x 15 mm soft Penumbra microcoil followed by a 3 x 5 mm soft Penumbra microcoil. Repeat angiogram from the proximal right inferior epigastric artery demonstrated adequate embolization. The microcatheter was removed. Dedicated right common femoral angiogram was performed which demonstrated widely patent common, superficial, and profunda arteries and branches without evidence of active extravasation or luminal irregularity. The catheters and sheath were removed. Manual compression was applied for proximally 20 minutes and hemostasis was achieved. Pulses were unchanged. The patient remained stable throughout the procedure and returned  to the ICU. The procedure was performed in concert with my associate, Dr. Daryll Brod. IMPRESSION: 1. Right pelvic and limited right lower extremity angiogram demonstrates diffuse vasospasm without evidence of active extravasation 2. Prophylactic coil embolization of the right inferior epigastric artery given territory of hematoma on comparison imaging. Ruthann Cancer, MD [facility] Radiology Electronically Signed   By: Ruthann Cancer M.D.   On: 12/02/2021 13:59   IR Angiogram Selective Each Additional Vessel  Result Date: 12/02/2021 INDICATION: 45 year old female with history of right inguinal and right lower abdominal wall hematoma with hemorrhagic shock. EXAM: 1. Ultrasound-guided vascular access of the left common femoral artery. 2. Selective catheterization angiography of the right common iliac artery, right common femoral artery, and right inferior epigastric artery. 3. Coil embolization of the right inferior epigastric artery. MEDICATIONS: None. ANESTHESIA/SEDATION: None. CONTRAST:  61m OMNIPAQUE IOHEXOL 300 MG/ML SOLN, 272mOMNIPAQUE IOHEXOL 300 MG/ML SOLN FLUOROSCOPY TIME:  Fluoroscopy Time: 5 minutes 18 seconds (636 mGy). COMPLICATIONS: None immediate. PROCEDURE: Informed consent was obtained from the patient following explanation of the procedure, risks, benefits and alternatives. The patient understands, agrees and consents for the procedure. All questions were addressed. A time out was performed prior to the initiation of the procedure. Maximal barrier sterile technique utilized including caps, mask, sterile gowns, sterile gloves, large sterile drape, hand hygiene, and Betadine prep. The left groin was prepped and draped in standard fashion. Preprocedure ultrasound evaluation demonstrated patency of the left common femoral artery. The procedure was planned. Subdermal Local anesthesia was provided with 1%  lidocaine. A small skin nick was made. Under  direct ultrasound visualization, the left common femoral artery was accessed with a 21 gauge micropuncture needle. An image was captured and stored the permanent record. A micropuncture sheath was placed in exchanged over an 035 J wire for a 5 [place], 11 cm vascular sheath. A 5 French omni Flush catheter was directed to the level of the aortic bifurcation. The right common and external iliac arteries were selected with a Glidewire over which the catheter was advanced into the right common iliac artery. Angiogram was then performed which demonstrated wide patency of the right common and external iliac arteries as well as the proximal superficial and profunda femoral arteries. There was no evidence of active extravasation. Given findings on recent comparison CT abdomen pelvis, the right inferior epigastric artery was then selectively catheterized with a Renegade hi Flo microcatheter and fathom 16 microwire. Angiogram was then performed which demonstrated diffuse vasospasm4444 of the inferior mesenteric artery and its branches without evidence of active extravasation. Given this appearance, prophylactic coil embolization was performed of the proximal right inferior epigastric artery with a 3 x 15 mm soft Penumbra microcoil followed by a 3 x 5 mm soft Penumbra microcoil. Repeat angiogram from the proximal right inferior epigastric artery demonstrated adequate embolization. The microcatheter was removed. Dedicated right common femoral angiogram was performed which demonstrated widely patent common, superficial, and profunda arteries and branches without evidence of active extravasation or luminal irregularity. The catheters and sheath were removed. Manual compression was applied for proximally 20 minutes and hemostasis was achieved. Pulses were unchanged. The patient remained stable throughout the procedure and returned to the ICU. The procedure was performed in concert with my associate, Dr. Daryll Brod. IMPRESSION: 1.  Right pelvic and limited right lower extremity angiogram demonstrates diffuse vasospasm without evidence of active extravasation 2. Prophylactic coil embolization of the right inferior epigastric artery given territory of hematoma on comparison imaging. Ruthann Cancer, MD [facility] Radiology Electronically Signed   By: Ruthann Cancer M.D.   On: 12/02/2021 13:59   IR US Guide Vasc Access Left  Result Date: 12/02/2021 INDICATION: 45 year old female with history of right inguinal and right lower abdominal wall hematoma with hemorrhagic shock. EXAM: 1. Ultrasound-guided vascular access of the left common femoral artery. 2. Selective catheterization angiography of the right common iliac artery, right common femoral artery, and right inferior epigastric artery. 3. Coil embolization of the right inferior epigastric artery. MEDICATIONS: None. ANESTHESIA/SEDATION: None. CONTRAST:  29m OMNIPAQUE IOHEXOL 300 MG/ML SOLN, 234mOMNIPAQUE IOHEXOL 300 MG/ML SOLN FLUOROSCOPY TIME:  Fluoroscopy Time: 5 minutes 18 seconds (636 mGy). COMPLICATIONS: None immediate. PROCEDURE: Informed consent was obtained from the patient following explanation of the procedure, risks, benefits and alternatives. The patient understands, agrees and consents for the procedure. All questions were addressed. A time out was performed prior to the initiation of the procedure. Maximal barrier sterile technique utilized including caps, mask, sterile gowns, sterile gloves, large sterile drape, hand hygiene, and Betadine prep. The left groin was prepped and draped in standard fashion. Preprocedure ultrasound evaluation demonstrated patency of the left common femoral artery. The procedure was planned. Subdermal Local anesthesia was provided with 1% lidocaine. A small skin nick was made. Under direct ultrasound visualization, the left common femoral artery was accessed with a 21 gauge micropuncture needle. An  image was captured and stored the permanent record. A micropuncture sheath was placed in exchanged over an 035 J wire for a 5 [place]  cm vascular sheath. A 5 French omni Flush catheter was directed to the level of the aortic bifurcation. The right common and external iliac arteries were selected with a Glidewire over which the catheter was advanced into the right common iliac artery. Angiogram was then performed which demonstrated wide patency of the right common and external iliac arteries as well as the proximal superficial and profunda femoral arteries. There was no evidence of active extravasation. Given findings on recent comparison CT abdomen pelvis, the right inferior epigastric artery was then selectively catheterized with a Renegade hi Flo microcatheter and fathom 16 microwire. Angiogram was then performed which demonstrated diffuse vasospasm4444 of the inferior mesenteric artery and its branches without evidence of active extravasation. Given this appearance, prophylactic coil embolization was performed of the proximal right inferior epigastric artery with a 3 x 15 mm soft Penumbra microcoil followed by a 3 x 5 mm soft Penumbra microcoil. Repeat angiogram from the proximal right inferior epigastric artery demonstrated adequate embolization. The microcatheter was removed. Dedicated right common femoral angiogram was performed which demonstrated widely patent common, superficial, and profunda arteries and branches without evidence of active extravasation or luminal irregularity. The catheters and sheath were removed. Manual compression was applied for proximally 20 minutes and hemostasis was achieved. Pulses were unchanged. The patient remained stable throughout the procedure and returned to the ICU. The procedure was performed in concert with my associate, Dr. Daryll Brod. IMPRESSION: 1. Right pelvic and limited right lower extremity angiogram demonstrates diffuse vasospasm without evidence of active  extravasation 2. Prophylactic coil embolization of the right inferior epigastric artery given territory of hematoma on comparison imaging. Ruthann Cancer, MD [facility] Radiology Electronically Signed   By: Ruthann Cancer M.D.   On: 12/02/2021 13:59   DG CHEST PORT 1 VIEW  Result Date: 12/02/2021 CLINICAL DATA:  Intubation EXAM: PORTABLE CHEST 1 VIEW COMPARISON:  Yesterday FINDINGS: Endotracheal tube with tip between the clavicular heads and carina. The enteric tube reaches the stomach. Right subclavian vein stenting. There is no edema, consolidation, effusion, or pneumothorax. Normal heart size. IMPRESSION: Unremarkable hardware. Clear lungs. Electronically Signed   By: Jorje Guild M.D.   On: 12/02/2021 10:42   DG Chest Port 1 View  Result Date: 12/11/2021 CLINICAL DATA:  Sepsis, wound infection. EXAM: PORTABLE CHEST 1 VIEW COMPARISON:  None. FINDINGS: The heart size and mediastinal contours are within normal limits. Vascular stent overlying the right subclavian vessel. Both lungs are clear. The visualized skeletal structures are unremarkable. IMPRESSION: No active disease. Electronically Signed   By: Keane Police D.O.   On: 11/22/2021 11:56   EEG adult  Result Date: 12/02/2021 Lora Havens, MD     12/02/2021 10:21 AM Patient Name: Jane Edwards MRN: [id number] Epilepsy Attending: Lora Havens Referring Physician/Provider: Dr Ina Homes Date: 12/02/2021 Duration: 21.20 mins Patient history: 45 year old female with altered mental status.  EEG to evaluate for seizures. Level of alertness: Awake AEDs during EEG study: None Technical aspects: This EEG study was done with scalp electrodes positioned according to the 10-20 International system of electrode placement. Electrical activity was acquired at a sampling rate of '500Hz'  and reviewed with a high frequency filter of '70Hz'  and a low frequency filter of '1Hz' . EEG data were recorded  continuously and digitally stored. Description: EEG showed continuous generalized 3 to 6 Hz theta-delta slowing. Hyperventilation and photic stimulation were not performed.   ABNORMALITY - Continuous slow, generalized IMPRESSION: This study is suggestive of moderate  diffuse encephalopathy, nonspecific etiology. No seizures or epileptiform discharges were seen throughout the recording. Priyanka Barbra Sarks   VAS [name] GROIN PSEUDOANEURYSM  Result Date: 12/10/2021  ARTERIAL PSEUDOANEURYSM  Patient Name:  Jane Edwards  Date of Exam:   11/26/2021 Medical Rec #: [id number]            Accession #:    [id number] Date of Birth: 1976-10-01            Patient Gender: F Patient Age:   51 years Exam Location:  [facility] Procedure:      VAS [name] GROIN PSEUDOANEURYSM Referring Phys: GRACE LOEFFLER --------------------------------------------------------------------------------  Exam: Right groin Indications: Patient complains of groin pain. History: S/p catheterization. Comparison Study: No prior studies. Performing Technologist: Archie Patten RVS  Examination Guidelines: A complete evaluation includes B-mode imaging, spectral Doppler, color Doppler, and power Doppler as needed of all accessible portions of each vessel. Bilateral testing is considered an integral part of a complete examination. Limited examinations for reoccurring indications may be performed as noted. +------------+----------+---------+------+----------+  Right Duplex PSV (cm/s) Waveform  Plaque Comment(s)  +------------+----------+---------+------+----------+  CFA              91     triphasic                    +------------+----------+---------+------+----------+  Summary: No evidence of pseudoaneurysm, AVF or DVT An avascular, hypoechoic focal area measuring 8 cm x 6 cm with irregular borders and containing internal septations and debris was visualized in the right groin at the area of most concern. Diagnosing physician: Deitra Mayo MD  Electronically signed by Deitra Mayo MD on 12/06/2021 at 4:01:12 PM.   --------------------------------------------------------------------------------    Final    IR EMBO ART  VEN HEMORR LYMPH EXTRAV  INC GUIDE ROADMAPPING  Result Date: 12/02/2021 INDICATION: 45 year old female with history of right inguinal and right lower abdominal wall hematoma with hemorrhagic shock. EXAM: 1. Ultrasound-guided vascular access of the left common femoral artery. 2. Selective catheterization angiography of the right common iliac artery, right common femoral artery, and right inferior epigastric artery. 3. Coil embolization of the right inferior epigastric artery. MEDICATIONS: None. ANESTHESIA/SEDATION: None. CONTRAST:  26m OMNIPAQUE IOHEXOL 300 MG/ML SOLN, 260mOMNIPAQUE IOHEXOL 300 MG/ML SOLN FLUOROSCOPY TIME:  Fluoroscopy Time: 5 minutes 18 seconds (636 mGy). COMPLICATIONS: None immediate. PROCEDURE: Informed consent was obtained from the patient following explanation of the procedure, risks, benefits and alternatives. The patient understands, agrees and consents for the procedure. All questions were addressed. A time out was performed prior to the initiation of the procedure. Maximal barrier sterile technique utilized including caps, mask, sterile gowns, sterile gloves, large sterile drape, hand hygiene, and Betadine prep. The left groin was prepped and draped in standard fashion. Preprocedure ultrasound evaluation demonstrated patency of the left common femoral artery. The procedure was planned. Subdermal Local anesthesia was provided with 1% lidocaine. A small skin nick was made. Under direct ultrasound visualization, the left common femoral artery was accessed with a 21 gauge micropuncture needle. An image was captured and stored the permanent record. A micropuncture sheath was placed in exchanged over an 035 J wire for a 5 [place] cm vascular sheath. A 5 French omni Flush catheter was directed to the level  of the aortic bifurcation. The right common and external iliac arteries were selected with a Glidewire over which the catheter was advanced into the right common iliac artery. Angiogram was then performed which demonstrated wide patency of  the right common and external iliac arteries as well as the proximal superficial and profunda femoral arteries. There was no evidence of active extravasation. Given findings on recent comparison CT abdomen pelvis, the right inferior epigastric artery was then selectively catheterized with a Renegade hi Flo microcatheter and fathom 16 microwire. Angiogram was then performed which demonstrated diffuse vasospasm4444 of the inferior mesenteric artery and its branches without evidence of active extravasation. Given this appearance, prophylactic coil embolization was performed of the proximal right inferior epigastric artery with a 3 x 15 mm soft Penumbra microcoil followed by a 3 x 5 mm soft Penumbra microcoil. Repeat angiogram from the proximal right inferior epigastric artery demonstrated adequate embolization. The microcatheter was removed. Dedicated right common femoral angiogram was performed which demonstrated widely patent common, superficial, and profunda arteries and branches without evidence of active extravasation or luminal irregularity. The catheters and sheath were removed. Manual compression was applied for proximally 20 minutes and hemostasis was achieved. Pulses were unchanged. The patient remained stable throughout the procedure and returned to the ICU. The procedure was performed in concert with my associate, Dr. Daryll Brod. IMPRESSION: 1. Right pelvic and limited right lower extremity angiogram demonstrates diffuse vasospasm without evidence of active extravasation 2. Prophylactic coil embolization of the right inferior epigastric artery given territory of hematoma on comparison imaging. Ruthann Cancer, MD [facility]  Radiology Electronically Signed   By: Ruthann Cancer M.D.   On: 12/02/2021 13:59    Labs:  CBC: Recent Labs    12/17/2021 1300 11/29/2021 1330 12/02/21 0219 12/02/21 0251 12/02/21 1501 12/03/21 0316  WBC 16.8*  --  32.2*  --  36.1* 21.1*  HGB 7.9*   < > 5.0* 5.4* 5.1* 5.1*  HCT 26.2*   < > 16.1* 16.0* 15.4* 14.8*  PLT 278  --  308  --  235 203   < > = values in this interval not displayed.    COAGS: Recent Labs    09/04/21 1344 11/04/21 0721 11/23/21 0850  INR 1.0 1.0 1.0  APTT 29  --   --     BMP: Recent Labs    01/14/21 1150 04/07/21 0000 11/30/2021 1300 11/30/2021 1330 11/22/2021 1332 12/07/2021 1926 12/02/21 0219 12/02/21 0251 12/02/21 1247 12/03/21 0316  NA 140   < > 138   < > 137   < > 134* 137 140 134*  K 4.4   < > 4.5   < > 4.5   < > 5.4* 5.3* 4.5 3.3*  CL 109*   < > 108  --  108  --  107  --  105 101  CO2 18*   < > 16*  --   --   --  <7*  --  15* 18*  GLUCOSE 123*   < > 173*  --  169*  --  121*  --  192* 196*  BUN 8   < > 15  --  15  --  21*  --  25* 35*  CALCIUM 9.1   < > 8.8*  --   --   --  7.8*  --  7.3* 6.8*  CREATININE 0.86   < > 1.72*  --  1.60*  --  2.68*  --  3.13* 3.76*  GFRNONAA 83   < > 37*  --   --   --  22*  --  18* 15*  GFRAA 96  --   --   --   --   --   --   --   --   --    < > =  values in this interval not displayed.    LIVER FUNCTION TESTS: Recent Labs    01/14/21 1150 04/07/21 0000 07/31/21 0000 09/03/21 1546 11/30/2021 1300 12/02/21 0219  BILITOT 0.3  --   --  0.6 1.0 0.9  AST 24   < > 46* 76* 47* 240*  ALT 18   < > 32 69* 47* 144*  ALKPHOS 108   < > 101 93 73 65  PROT 7.0  --   --  6.7 5.7* 4.9*  ALBUMIN 4.1   < > 4.0 3.6 3.0* 2.4*   < > = values in this interval not displayed.    Assessment and Plan:  Acute blood loss s/p right pelvic angiogram with prophylactic coil embolization of the right inferior epigastric artery via the left common femoral artery   Hemoglobin level is stable compared to yesterday. The right groin  hematoma is softer today. IR notified that the family requested comfort care to honor the wishes of Violia's living will. She was extubated this afternoon and supportive medications have been discontinued.     Electronically Signed: Soyla Dryer, AGACNP-BC [phone] 12/03/2021, 2:47 PM   I spent a total of 15 Minutes at the the patient's bedside AND on the patient's hospital floor or unit, greater than 50% of which was counseling/coordinating care for pelvic angiogram with prophylactic embolization.

## 2021-12-04 NOTE — Progress Notes (Signed)
NAME:  Jane Edwards, MRN:  [id number], DOB:  27-May-1976, LOS: 3 ADMISSION DATE:  12/12/2021, CONSULTATION DATE:  12/13 REFERRING MD:  Eulis Foster, CHIEF COMPLAINT:  weakness/sepsis   History of Present Illness:  Jane Edwards is a 45 yo female with past medical history as stated below. She had recanalization and stent placement for SVC occlusion by IR on 11/24/21.  On 12/13, the patient sustained a mechanical fall and then developed weakness, and soreness in Rt groin area with an expanding bruise. She presented to the ED and began to develop waxing/waning mental status in ER with episodes of nausea and vomiting.  She was started on ABx for possible sepsis. Found to have worsening anemia with low BP and PCCM asked to assess for admission to ICU. Of note, patient is a Jehovah's witness and declines all blood products.   Pertinent  Medical History  HTN, DM type 2, Seizures, Iron deficiency with q9month iron transfusion, Menorrhagia, Poor venous access with prior indwelling subclavian vein ports, Uterine embolization, CHF, Chronic fatigue, Chronic headaches, Depression with anxiety, HTN, HLD, OSA, PTSD  Significant Hospital Events: Including procedures, antibiotic start and stop dates in addition to other pertinent events   12/13 admitted to ICU, vascular surg consulted, started on abx for sepsis 12/14 Hb 5.0- patient is Jehovah's witness and declines all blood products- received iron dextran infusion. Intubated  12/14 Underwent prophylactic coil embolization of R inferior epigastric artery with IR.  12/15 Code status changed to DNR and patient extubated per family (HPOA) request; focusing on comfort measures at this time  Interim History / Subjective:  Family at bedside. Patient not awaking, but appears comfortable.   Objective   Blood pressure 131/62, pulse 72, temperature 98.7 F (37.1 C), temperature source Axillary, resp. rate (!) 6, height 5\' 1"  (1.549 m), weight (!) 148.1 kg, SpO2  97 %.    Vent Mode: PRVC FiO2 (%):  [40 %] 40 % Set Rate:  [30 bmp] 30 bmp Vt Set:  [450 mL] 450 mL Pressure Support:  [8 cmH20] 8 cmH20 Plateau Pressure:  [21 cmH20-22 cmH20] 22 cmH20   Intake/Output Summary (Last 24 hours) at 12/08/2021 0623 Last data filed at 11/26/2021 0200 Gross per 24 hour  Intake 1643.42 ml  Output --  Net 1643.42 ml   Filed Weights   12/02/21 0500 12/03/21 0326  Weight: (!) 137 kg (!) 148.1 kg    Examination: General: NAD, unresponsive.  HENT: [place], AT.  Lungs: Diminished breath sounds diffusely. Agonal respirations.  Cardiovascular: RRR, no murmurs Abdomen: R groin hematoma soft and smaller, soft abd, nontender, +BS Extremities: left femoral CVC, no LE edema Neuro: Sedated, unable to follow commands   Resolved Hospital Problem list     Assessment & Plan:  Sepsis likely secondary to R groin hematoma/infection Lactic acidosis Altered mental status secondary to above 1/4 blood cultures growing staph epi - likely a contaminant. Off abx at this time.  - Continue to follow blood cultures  Hypotension  Secondary to above. Off pressors, as family wishes to pursue comfort measures and they do not want any life sustaining measures. MAPs ~40s.  Acute blood loss anemia Hb noted to be 12.2 3 days ago and has been downtrending. Hb stable held stable at 5.1  yesterday.. Patient is a Jehovah's witness and declines all blood products. She developed multiorgan failure from acute blood loss/hemorrhagic shock. S/p IV iron dextran infusion on 12/14. Also s/p coil embolization of R inf epigastric artery with IR. Received EPO  yesterday. No longer checking CBC, as patient is now comfort care.   Acute respiratory failure requiring mechanical ventilation Metabolic acidosis Intubated on 12/14 and extubated on 12/15 per request of the HPOA as the patient had made it clear to them, that she would never want to be put on a ventilator. RR  ~6 this morning, saturating well on  nasal cannula although patient having agonal respirations.    Acute renal failure sCr continues to rise - most recently 3.76 yesterday. Acute renal failure likely secondary to profound acute blood loss anemia. Patient is anuric.    Best Practice (right click and "Reselect all SmartList Selections" daily)   Diet/type: NPO DVT prophylaxis: not indicated GI prophylaxis: N/A Lines: Central line and Arterial Line Foley:  Yes, and it is still needed Code Status: DNR/comfort care Last date of multidisciplinary goals of care discussion [updated mother and father 12/16]  Labs   CBC: Recent Labs  Lab 11/30/21 0000 11/26/2021 1300 12/12/2021 1330 11/19/2021 1926 12/02/21 0219 12/02/21 0251 12/02/21 1501 12/03/21 0316  WBC 5.6 16.8*  --   --  32.2*  --  36.1* 21.1*  NEUTROABS 4.20 13.8*  --   --   --   --   --   --   HGB 12.2 7.9*   < > 5.4* 5.0* 5.4* 5.1* 5.1*  HCT 37 26.2*   < > 16.0* 16.1* 16.0* 15.4* 14.8*  MCV  --  92.9  --   --  91.0  --  87.0 86.0  PLT 161 278  --   --  308  --  235 203   < > = values in this interval not displayed.    Basic Metabolic Panel: Recent Labs  Lab 12/02/2021 1300 11/19/2021 1330 11/28/2021 1332 12/18/2021 1926 12/02/21 0219 12/02/21 0251 12/02/21 1247 12/03/21 0316  NA 138   < > 137 139 134* 137 140 134*  K 4.5   < > 4.5 4.5 5.4* 5.3* 4.5 3.3*  CL 108  --  108  --  107  --  105 101  CO2 16*  --   --   --  <7*  --  15* 18*  GLUCOSE 173*  --  169*  --  121*  --  192* 196*  BUN 15  --  15  --  21*  --  25* 35*  CREATININE 1.72*  --  1.60*  --  2.68*  --  3.13* 3.76*  CALCIUM 8.8*  --   --   --  7.8*  --  7.3* 6.8*   < > = values in this interval not displayed.   GFR: Estimated Creatinine Clearance: 26.5 mL/min (A) (by C-G formula based on SCr of 3.76 mg/dL (H)). Recent Labs  Lab 12/06/2021 1300 12/02/21 0219 12/02/21 1501 12/03/21 0316  WBC 16.8* 32.2* 36.1* 21.1*  LATICACIDVEN 6.1*  --   --   --     Liver Function Tests: Recent Labs  Lab  11/21/2021 1300 12/02/21 0219  AST 47* 240*  ALT 47* 144*  ALKPHOS 73 65  BILITOT 1.0 0.9  PROT 5.7* 4.9*  ALBUMIN 3.0* 2.4*   Recent Labs  Lab 11/19/2021 1300  LIPASE 21   No results for input(s): AMMONIA in the last 168 hours.  ABG    Component Value Date/Time   PHART 7.393 12/02/2021 1307   PCO2ART 24.3 (L) 12/02/2021 1307   PO2ART 438 (H) 12/02/2021 1307   HCO3 14.6 (L) 12/02/2021 1307   TCO2 9 (  L) 12/02/2021 0251   ACIDBASEDEF 9.4 (H) 12/02/2021 1307   O2SAT 99.9 12/02/2021 1307     Coagulation Profile: No results for input(s): INR, PROTIME in the last 168 hours.  Cardiac Enzymes: Recent Labs  Lab 12/02/21 1247  CKTOTAL 138    HbA1C: No results found for: HGBA1C  CBG: Recent Labs  Lab 12/02/21 1912 12/02/21 2318 12/03/21 0343 12/03/21 0725 12/03/21 1103  GLUCAP 169* 180* 145* 125* 127*    Review of Systems:     Critical care time: 30 minutes

## 2021-12-04 NOTE — Progress Notes (Signed)
Nutrition Brief Note  Chart reviewed. Pt now transitioning to comfort care.  No further nutrition interventions planned at this time.  Please re-consult as needed.   Zari Cly H, RD, LDN, CNSC Please refer to Amion for contact information.                                                          

## 2021-12-20 NOTE — Progress Notes (Signed)
Patient's time of death 20 verified by this RN and Tish CN.  Both patient's parents were at the bedside.  MD Elsworth Soho notified.

## 2021-12-20 NOTE — Death Summary Note (Addendum)
°  Name: Jane Edwards MRN: [id number] DOB: 1976-01-09 46 y.o.  Date of Admission: 12/17/2021 10:18 AM Date of Discharge: 12/14/2021 Attending Physician: Margaretha Seeds, MD  Discharge Diagnosis: Hemorrhagic shock 2/2 right groin hematoma Acute metabolic encephalopathy Acute renal failure Acute respiratory failure with hypoxemia Acute on chronic diastolic heart failure  Cause of death: Hemorrhagic shock Time of death: 0421  Disposition and follow-up:   Ms.Jane Edwards was discharged from [facility] in expired condition.    Hospital Course: Patient presented on 12/13 with AMS and nausea/vomiting in setting of right groin hematoma that developed after a mechanical fall in setting of recent stent placement for SVC occlusion on 11/24/2021. She was started on broad spectrum antibiotics for concerns of sepsis. However, was found to have hemoglobin of 5 with hypotension requiring pressor support. Patient admitted to ICU. Declined blood products due to religious beliefs but did receive IV iron and Aranesp and underwent prophylactic coil embolization of right inferior epigastric artery with IR on 12/14. Hemoglobin remained stable at 5. Patient remained on ventilator support and progressed to multiorgan failure including acute renal failure in setting of hemorrhagic shock. On 12/15, patient's family changed code status to DNR and transitioned to comfort care based on patient's wishes prior to hospitalization. Pressor support discontinued and patient compassionately extubated. Time of death 76  Signed: Harvie Heck, MD Internal Medicine, PGY-3 2021-12-26 1:46 PM Pager # [phone]  Attending attestation:  Hemorrhagic shock secondary right groin hematoma Acute metabolic encephalopathy Acute respiratory failure with hypoxemia Acute renal failure  TOD 0421 on 12-26-2021  Rodman Pickle, M.D. [facility] Pulmonary/Critical Care Medicine Dec 26, 2021 1:46 PM   Please  see Amion for pager number to reach on-call Pulmonary and Critical Care Team.

## 2021-12-20 DEATH — deceased

## 2021-12-29 ENCOUNTER — Telehealth (HOSPITAL_COMMUNITY): Payer: Medicare Other | Admitting: Psychiatry

## 2022-02-15 ENCOUNTER — Ambulatory Visit: Payer: Medicare Other | Admitting: Neurology

## 2022-03-01 ENCOUNTER — Other Ambulatory Visit: Payer: Medicare Other

## 2022-03-01 ENCOUNTER — Ambulatory Visit: Payer: Self-pay | Admitting: Oncology

## 2022-03-03 ENCOUNTER — Ambulatory Visit: Payer: Medicare Other | Admitting: Cardiology

## 2022-12-07 IMAGING — CR DG CHEST 2V
2 series · 2 of 2 positions shown · non-contrast
Comparison: 06/05/2021, interventional images 08/10/2021

CLINICAL DATA: PICC problem

EXAM:
CHEST - 2 VIEW

[chest lat]
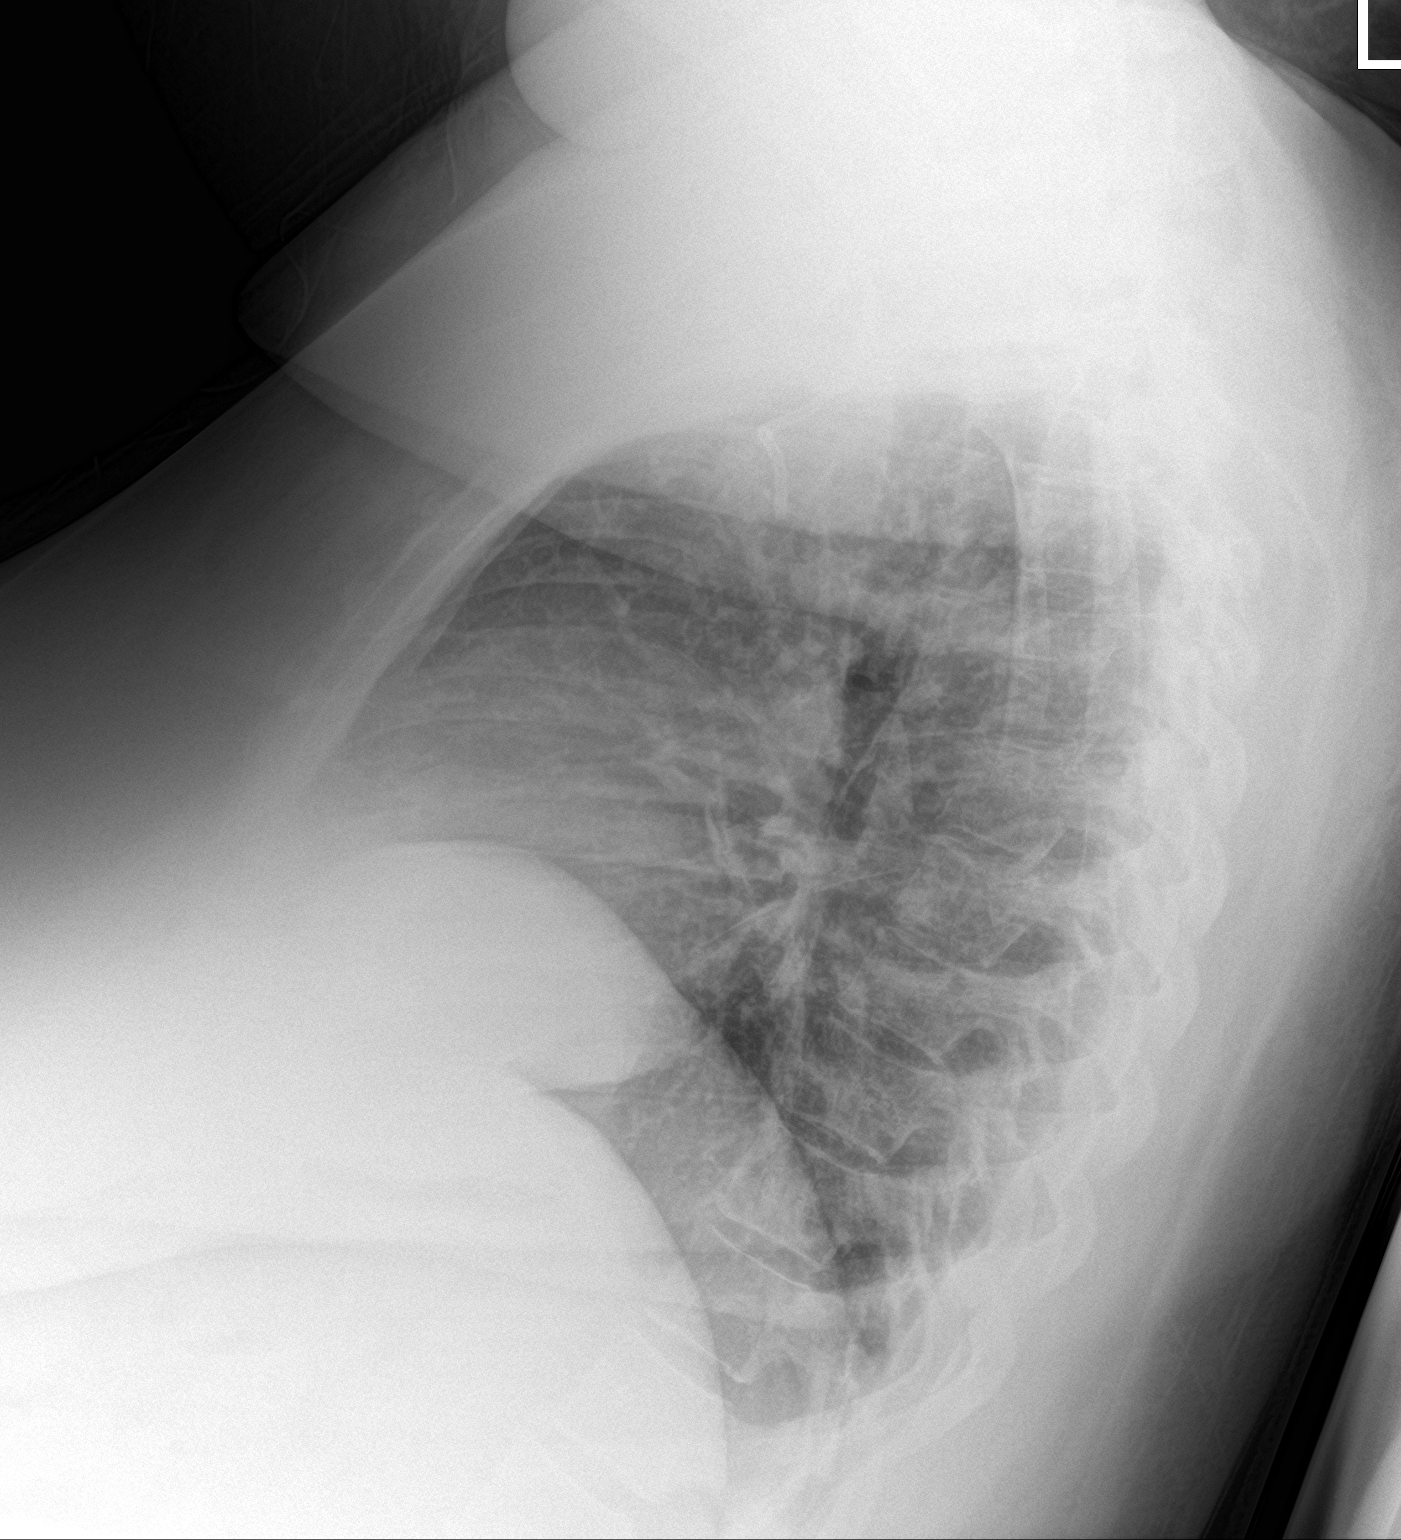

[chest ap]
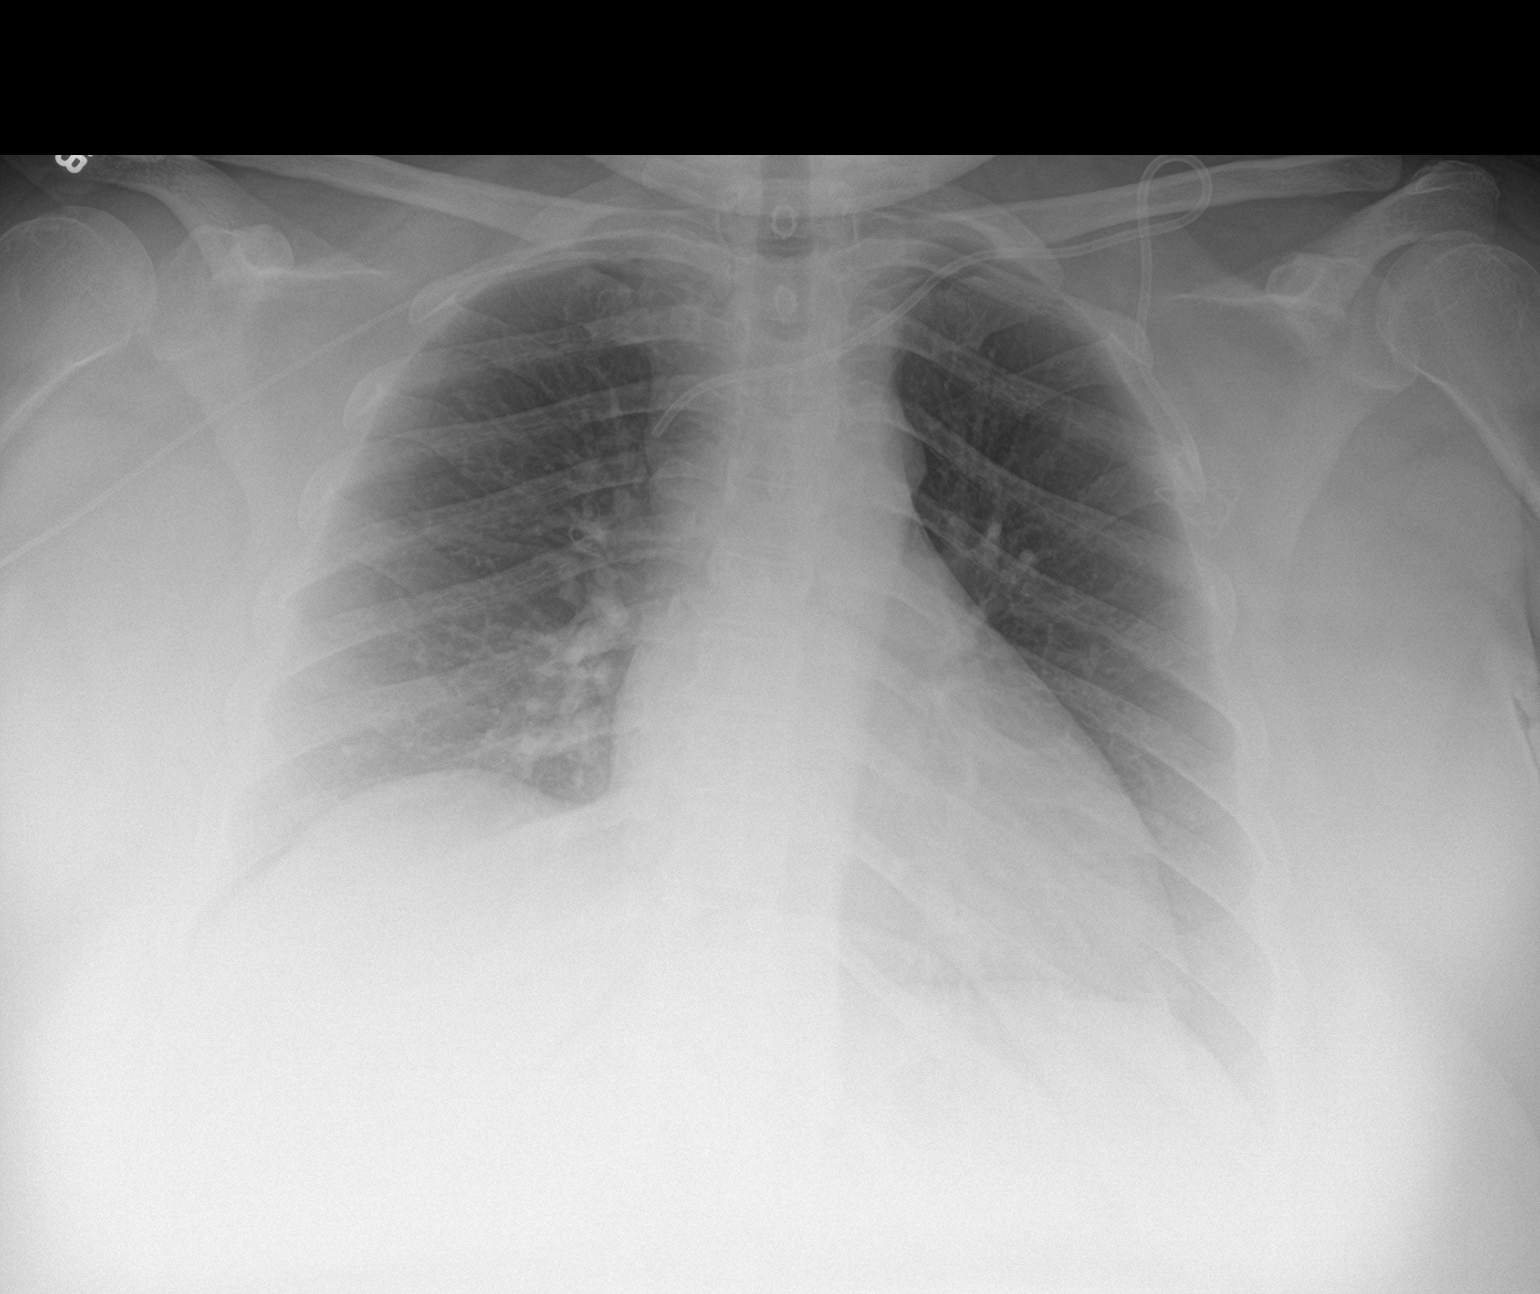

[2 of 2 positions shown; findings below may reference images not displayed]

FINDINGS: Right upper extremity central venous catheter appears slightly
withdrawn as compared with spot fluoroscopic image from 08/10/2021.
The tip projects over the subclavian region. A left-sided central
venous port remains in place with tip over the SVC confluence. No
focal opacity or pleural effusion. Normal cardiac size. No
pneumothorax
IMPRESSION: 1. Right upper extremity central venous catheter tip appears
slightly retracted, it projects over the subclavian region, beneath
the medial right clavicle
2. Lung fields are clear

## 2022-12-12 IMAGING — US IR [PERSON_NAME]/SVC
1 series · 3 of 3 positions shown · non-contrast
Comparison: CT venogram chest from 08/21/2021, upper extremity
venograms from 08/10/2021

INDICATION: 44-year-old female with history of chronic iron-deficiency requiring
routine iron transfusions with history of poor venous access and
bilateral subclavian vein ports which have since been removed due to
subsequent development of SVC syndrome. She presents today for
central venous recanalization and port placement.
TECHNIQUE: Informed written consent was obtained from the patient after a
thorough discussion of the procedural risks, benefits and
alternatives. All questions were addressed. Maximal Sterile Barrier
Technique was utilized including caps, mask, sterile gowns, sterile
gloves, sterile drape, hand hygiene and skin antiseptic. A timeout
was performed prior to the initiation of the procedure.

[Series 1: ir (person_name)/svc · 3 of 3 slices shown]
[im 1/3]
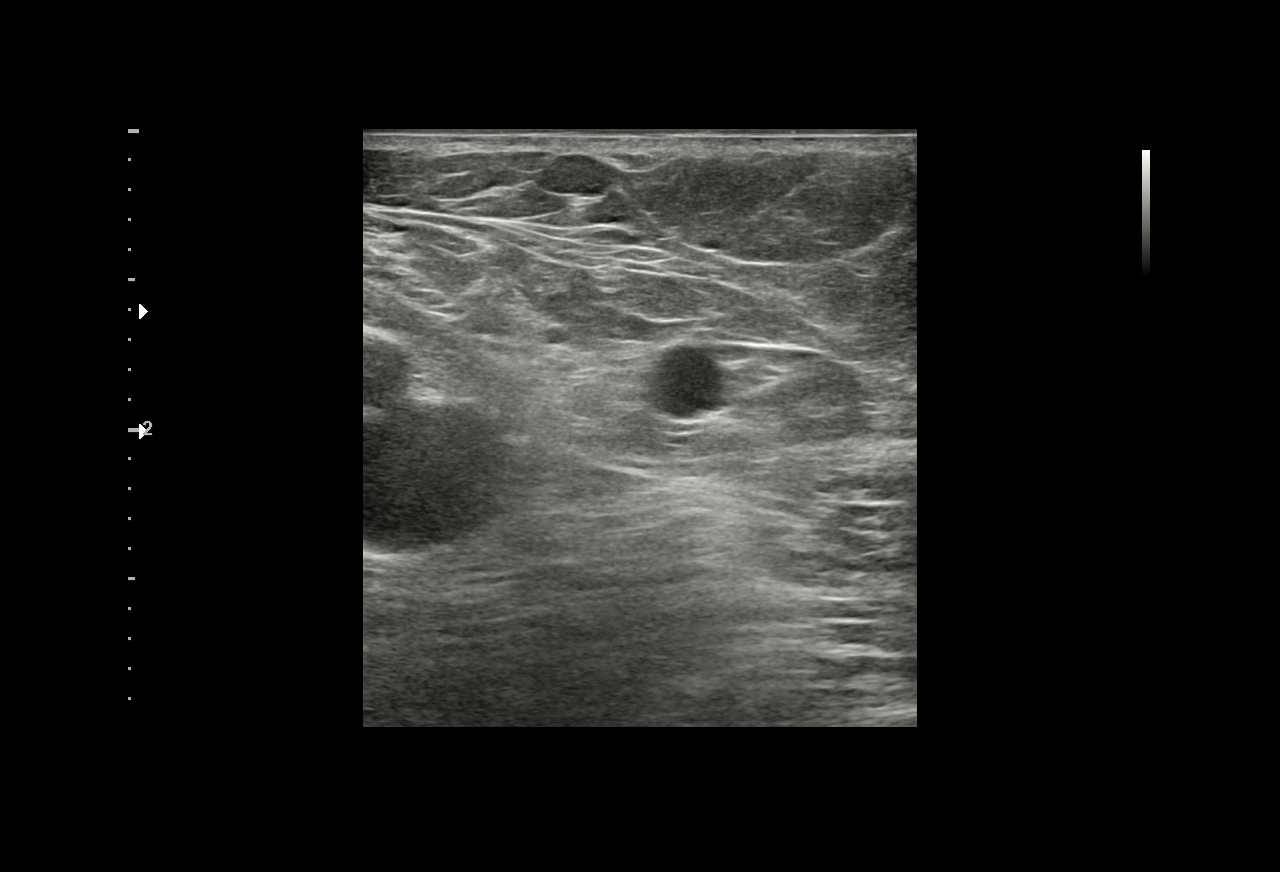
[im 2/3]
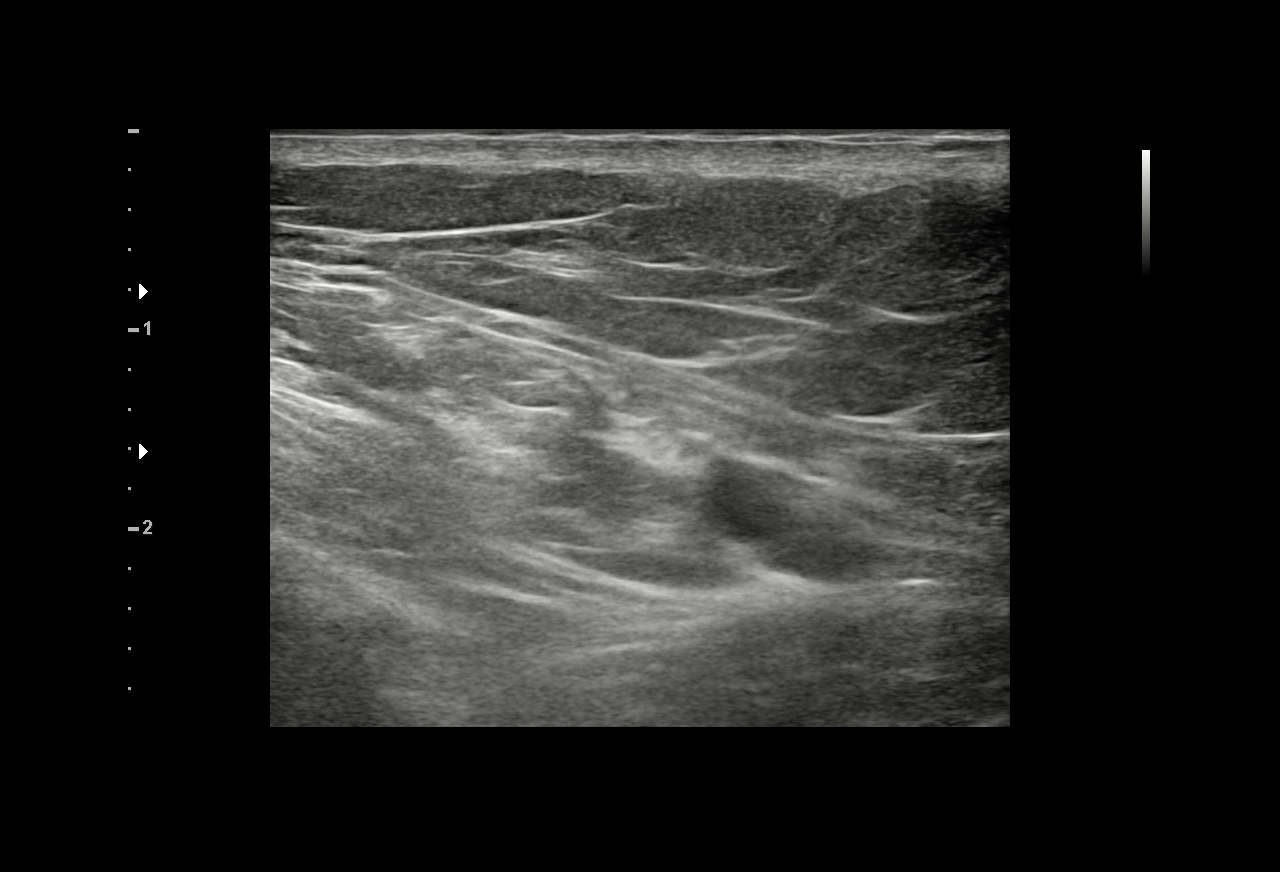
[im 3/3]
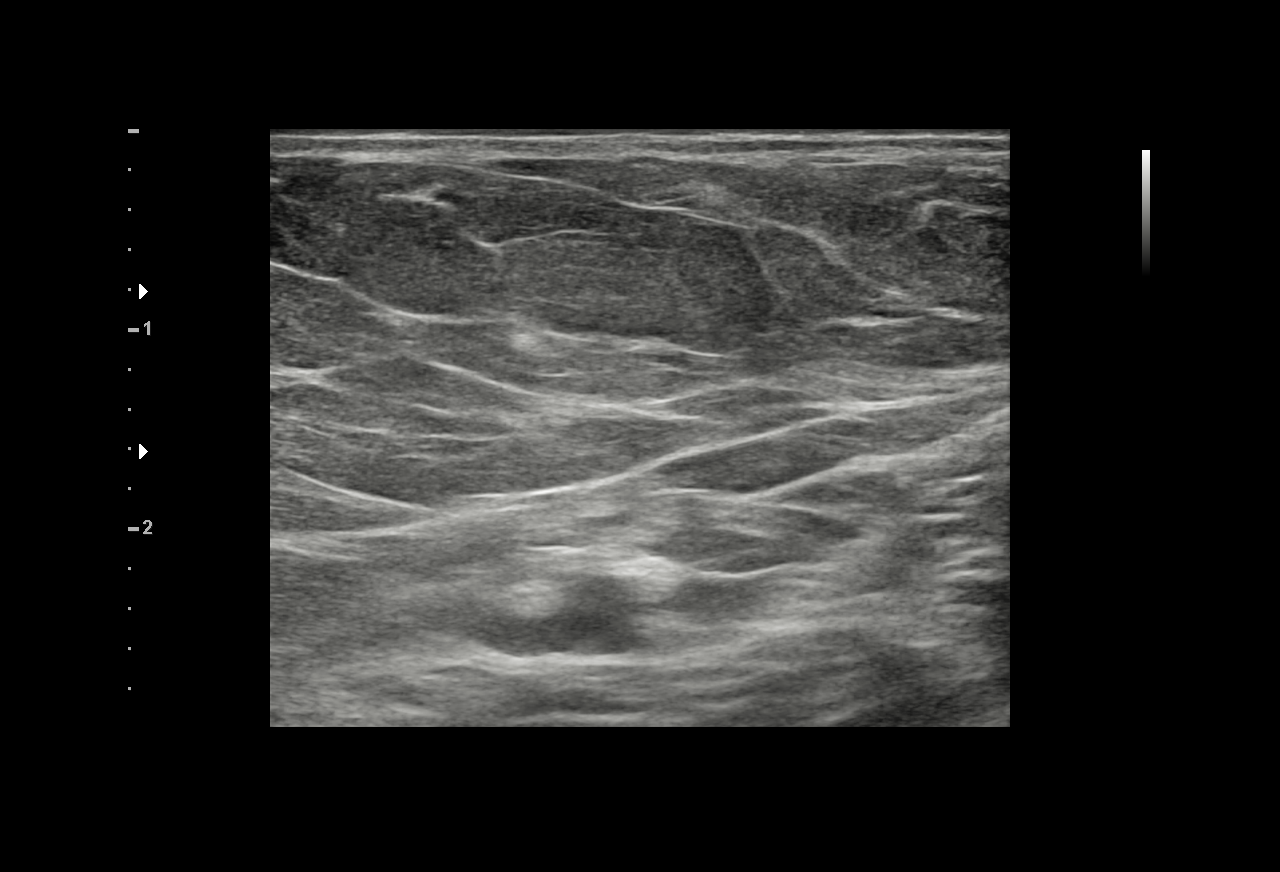

[3 of 3 positions shown; findings below may reference images not displayed]

EXAM:
1. Ultrasound-guided vascular access of the right greater saphenous
vein and left basilic vein.
2. Bilateral upper extremity and central venography.
3. Intravascular ultrasound.
4. Plain balloon angioplasty of the right subclavian vein.
5. Removal of indwelling right upper extremity PICC.
6. Placement of left basilic vein PICC/midline.
MEDICATIONS:
None.

ANESTHESIA/SEDATION:
The procedure was performed under general anesthesia.

FLUOROSCOPY TIME:  Fluoroscopy Time: 36 minutes (1,844 mGy).

COMPLICATIONS:
None immediate.
The right neck and chest, bilateral upper extremities, and right
groin were prepped and draped in standard fashion. Preprocedure
ultrasound evaluation demonstrated patency of the right common
femoral vein and right greater saphenous vein. The right greater
saphenous vein was targeted for venous access. A small skin nick was
made at the planned needle entry site. Under direct ultrasound
visualization, a 21 gauge micropuncture needle was directed into the
right greater saphenous vein. An image was captured and stored in
the permanent record. A micropuncture sheath was placed. Bambucafe Tarla
wire was then directed to the inferior vena cava under fluoroscopic
guidance in the micropuncture sheath was exchanged for a 9 French,
11 cm vascular sheath. Subsequently, a 6 French common 90 cm sheath
was then directed under fluoroscopic guidance to the superior vena
cava.

The left basilic vein was then targeted for venous access. A small
skin nick was made at the planned needle entry site. Under direct
ultrasound visualization, a 21 gauge micropuncture needle was
directed into the left basilic vein. An image was captured and
stored in the permanent record. A micropuncture sheath was placed. AMNON
Lesvia wire was then directed to the left subclavian vein under
fluoroscopic guidance in the micropuncture sheath was exchanged for
a 5 French, 11 cm vascular sheath.

Through the indwelling right upper extremity PICC, and newly placed
left upper extremity and right groin sheaths, bilateral upper
extremity and central venography was performed. Venogram was
significant for severe stenosis in the right subclavian vein,
possible mild stenosis in the left innominate vein, and occlusion of
the superior segment of the superior vena cava measuring
approximately 1 cm in total length. There are multiple pericardial,
mediastinal, vertebral, and chest wall collateral veins.

From the indwelling groin access, retrograde catheter and wire
recanalization was attempted with a combination of multiple
catheters and wires including a 0.035 inch, straight tip CXI
catheter, angled tip 0.035 inch Navicross, regular and stiff angled
tip glide wires. This was unsuccessful. Therefore similar catheter
and wire recanalization was attempted from the right upper extremity
after exchanging the indwelling PICC line for a 9 French, 33 cm
braided tip sheath. In order to advance the sheath into the right
innominate vein, balloon angioplasty of the stenotic right
subclavian vein was performed with a 5 mm by 4 cm mustang balloon
followed by a 8 mm x 4 cm mustang balloon. The sheath was then
positioned in the innominate vein catheter and wire recanalization
was attempted which was also unsuccessful. The angled tip sheath was
then exchanged for a 7 French 55 cm Tourguide steerable sheath to
provide more support, however this was unsuccessful in catheter and
wire recanalization. Performing multiple focal venograms in varying
obliquities to confirm positioning, recanalization with the back end
of a stiff glidewire from the femoral approach was performed,
however the glidewire was unable to be advanced through the
seemingly thick, fibrotic tissue at the site of occlusion in the
SVC. Catheter and wire recanalization was also attempted from the
indwelling access in the left upper extremity however this was also
unsuccessful.

Therefore, the indwelling 5 French sheath in the left upper
extremity was exchanged for a midline length single lumen 5 French
PICC line with the tip positioned in the left axillary vein. The
indwelling right upper extremity and right groin sheath were then
removed and hemostasis was achieved with manual compression.

Sterile bandage were applied. She was transferred to the
postanesthesia care unit in stable condition.

Portions of the procedure were assisted by my associate, Dr. Kristina
Deukgyo.
FINDINGS: Short segment (1 cm) occlusion of the superior aspect of the
superior vena cava. High-grade stenosis of the right subclavian
vein. Technically successful balloon angioplasty of the right
subclavian vein. Technically unsuccessful central venous
recanalization with catheter and wire and back end of the Glidewire
techniques.
IMPRESSION: 1. Technically unsuccessful attempt at catheter and wire
recanalization of short-segment occlusion of the peripheral aspect
of the superior vena cava.
2. Technically successful balloon angioplasty of high-grade stenosis
in the right subclavian vein.
3. Removal of indwelling right upper extremity PICC in exchange for
new placement of left upper extremity midline.

PLAN:
Systemic anticoagulation with the preferable, however the patient
has multiple drugs drug interactions and is limited to only
anticoagulation via Coumadin which the patient expresses wishes to
avoid. Left upper extremity PICC may remain in place for now, and if
iron infusion is required soon, this can be used.

Plan to have discussion with patient family in short-term follow-up
to discuss possible additional procedures that could be attempted
including sharp recanalization with stent placement, in addition to
long-term anticoagulation plan.
